# Patient Record
Sex: Female | Born: 1975 | Race: White | Hispanic: No | Smoking: Former smoker
Health system: Southern US, Community
[De-identification: ages and names within clinical notes are randomized; demographics above are authoritative.]

## PROBLEM LIST (undated history)

## (undated) DIAGNOSIS — I10 Essential (primary) hypertension: Secondary | ICD-10-CM

## (undated) DIAGNOSIS — M25532 Pain in left wrist: Secondary | ICD-10-CM

## (undated) DIAGNOSIS — F419 Anxiety disorder, unspecified: Secondary | ICD-10-CM

## (undated) DIAGNOSIS — J45909 Unspecified asthma, uncomplicated: Secondary | ICD-10-CM

## (undated) DIAGNOSIS — M797 Fibromyalgia: Secondary | ICD-10-CM

## (undated) DIAGNOSIS — F32A Depression, unspecified: Secondary | ICD-10-CM

## (undated) DIAGNOSIS — Z8619 Personal history of other infectious and parasitic diseases: Secondary | ICD-10-CM

## (undated) DIAGNOSIS — D509 Iron deficiency anemia, unspecified: Secondary | ICD-10-CM

## (undated) DIAGNOSIS — M92219 Osteochondrosis (juvenile) of carpal lunate [Kienbock], unspecified hand: Secondary | ICD-10-CM

## (undated) DIAGNOSIS — M1990 Unspecified osteoarthritis, unspecified site: Secondary | ICD-10-CM

## (undated) DIAGNOSIS — F1010 Alcohol abuse, uncomplicated: Secondary | ICD-10-CM

## (undated) DIAGNOSIS — F329 Major depressive disorder, single episode, unspecified: Secondary | ICD-10-CM

## (undated) DIAGNOSIS — Q682 Congenital deformity of knee: Secondary | ICD-10-CM

## (undated) HISTORY — DX: Congenital deformity of knee: Q68.2

## (undated) HISTORY — DX: Unspecified asthma, uncomplicated: J45.909

## (undated) HISTORY — DX: Major depressive disorder, single episode, unspecified: F32.9

## (undated) HISTORY — PX: APPENDECTOMY: SHX54

## (undated) HISTORY — DX: Alcohol abuse, uncomplicated: F10.10

## (undated) HISTORY — DX: Personal history of other infectious and parasitic diseases: Z86.19

## (undated) HISTORY — DX: Unspecified osteoarthritis, unspecified site: M19.90

## (undated) HISTORY — DX: Anxiety disorder, unspecified: F41.9

## (undated) HISTORY — DX: Essential (primary) hypertension: I10

## (undated) HISTORY — DX: Depression, unspecified: F32.A

---

## 2000-11-13 ENCOUNTER — Emergency Department (HOSPITAL_COMMUNITY): Admission: EM | Admit: 2000-11-13 | Discharge: 2000-11-13 | Payer: Self-pay | Admitting: Emergency Medicine

## 2000-11-13 ENCOUNTER — Encounter: Payer: Self-pay | Admitting: Emergency Medicine

## 2004-06-18 ENCOUNTER — Ambulatory Visit: Payer: Self-pay | Admitting: Internal Medicine

## 2006-12-17 ENCOUNTER — Ambulatory Visit (HOSPITAL_COMMUNITY): Admission: RE | Admit: 2006-12-17 | Discharge: 2006-12-17 | Payer: Self-pay | Admitting: Family Medicine

## 2008-02-08 ENCOUNTER — Inpatient Hospital Stay (HOSPITAL_COMMUNITY): Admission: AD | Admit: 2008-02-08 | Discharge: 2008-02-10 | Payer: Self-pay | Admitting: Obstetrics and Gynecology

## 2008-02-08 ENCOUNTER — Inpatient Hospital Stay (HOSPITAL_COMMUNITY): Admission: AD | Admit: 2008-02-08 | Discharge: 2008-02-08 | Payer: Self-pay | Admitting: Obstetrics & Gynecology

## 2010-07-22 ENCOUNTER — Other Ambulatory Visit: Payer: Self-pay | Admitting: Obstetrics and Gynecology

## 2011-01-06 LAB — RPR: RPR Ser Ql: NONREACTIVE

## 2011-01-06 LAB — CBC
MCHC: 32.2
RDW: 21.9 — ABNORMAL HIGH

## 2011-07-06 ENCOUNTER — Ambulatory Visit (INDEPENDENT_AMBULATORY_CARE_PROVIDER_SITE_OTHER): Payer: BC Managed Care – PPO | Admitting: Family Medicine

## 2011-07-06 VITALS — BP 114/74 | HR 81 | Temp 99.1°F | Resp 16 | Ht 62.75 in | Wt 205.0 lb

## 2011-07-06 DIAGNOSIS — J029 Acute pharyngitis, unspecified: Secondary | ICD-10-CM

## 2011-07-06 MED ORDER — HYDROCODONE-ACETAMINOPHEN 5-500 MG PO TABS: 1.0000 | ORAL_TABLET | Freq: Three times a day (TID) | ORAL | Status: AC | PRN

## 2011-07-06 NOTE — Patient Instructions (Signed)
L-lysine daily

## 2011-07-06 NOTE — Progress Notes (Signed)
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  Exposed to strep.  O:  Ulcer on left tonsillar pillar with corresponding left ant cervical node Neck otherwise supple Ears normal  A:  Adenovirus with ulceration and diarhea.  P:  MMW Vicodin for hs L-lysine

## 2011-09-11 ENCOUNTER — Ambulatory Visit (HOSPITAL_COMMUNITY)
Admission: RE | Admit: 2011-09-11 | Discharge: 2011-09-11 | Disposition: A | Discharge status: Home / Self Care | Payer: BC Managed Care – PPO | Source: Ambulatory Visit | Attending: Physician Assistant | Admitting: Physician Assistant

## 2011-09-11 ENCOUNTER — Ambulatory Visit (INDEPENDENT_AMBULATORY_CARE_PROVIDER_SITE_OTHER): Payer: BC Managed Care – PPO | Admitting: Family Medicine

## 2011-09-11 ENCOUNTER — Other Ambulatory Visit: Payer: Self-pay | Admitting: Family Medicine

## 2011-09-11 VITALS — BP 116/77 | HR 79 | Temp 99.0°F | Resp 18 | Ht 62.75 in | Wt 213.0 lb

## 2011-09-11 DIAGNOSIS — R609 Edema, unspecified: Secondary | ICD-10-CM

## 2011-09-11 DIAGNOSIS — M7989 Other specified soft tissue disorders: Secondary | ICD-10-CM

## 2011-09-11 LAB — POCT CBC
HCT, POC: 37.2 % — AB (ref 37.7–47.9)
Hemoglobin: 11.9 g/dL — AB (ref 12.2–16.2)
Lymph, poc: 2.4 (ref 0.6–3.4)
MCH, POC: 29.7 pg (ref 27–31.2)
MCHC: 32 g/dL (ref 31.8–35.4)
MCV: 92.8 fL (ref 80–97)
RBC: 4.01 M/uL — AB (ref 4.04–5.48)
WBC: 10.9 10*3/uL — AB (ref 4.6–10.2)

## 2011-09-11 LAB — POCT UA - MICROSCOPIC ONLY
Casts, Ur, LPF, POC: NEGATIVE
Crystals, Ur, HPF, POC: NEGATIVE
Mucus, UA: NEGATIVE

## 2011-09-11 LAB — POCT URINALYSIS DIPSTICK
Blood, UA: NEGATIVE
Glucose, UA: NEGATIVE
Spec Grav, UA: 1.01
Urobilinogen, UA: 0.2
pH, UA: 6

## 2011-09-11 NOTE — Progress Notes (Signed)
VASCULAR LAB PRELIMINARY  PRELIMINARY  PRELIMINARY  PRELIMINARY  Right lower extremity venous duplex completed.    Preliminary report: Right leg is negative for deep and superficial vein thrombosis.  Vanna Scotland,   RVT 09/11/2011, 5:37 PM

## 2011-09-11 NOTE — Progress Notes (Signed)
Subjective:    Patient ID: Tonya Perry, female    DOB: [birth year], 36 y.o.   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 "Charley horse" types pain in back of calf. No SOB. No CP.  No FH clotting disorders. Also, she has been trying to do Atkin's diet for the last 3 weeks, but she hasn't been very disciplined. She has had swelling in her R leg before and it always seem to happen on that side only.    Review of Systems  All other systems reviewed and are negative.       Objective:   Physical Exam  Nursing note and vitals reviewed. Constitutional: She is oriented to person, place, and time. She appears well-developed and well-nourished.  HENT:  Head: Normocephalic and atraumatic.  Neck: Normal range of motion. Neck supple. No thyromegaly present.  Cardiovascular: Normal rate, regular rhythm and normal heart sounds.   Pulmonary/Chest: Effort normal and breath sounds normal.  Musculoskeletal: Normal range of motion.       Lower extremities.  R calf 19", L calf, 18 3/4". Neg Homan's.  No erythema of either calf.  Edema is more around ankle.  DP pulses =B   Lymphadenopathy:    She has no cervical adenopathy.  Neurological: She is alert and oriented to person, place, and time.  Skin: Skin is warm.    Results for orders placed in visit on 09/11/11  POCT CBC      Component Value Range   WBC 10.9 (*) 4.6 - 10.2 (K/uL)   Lymph, poc 2.4  0.6 - 3.4    POC LYMPH PERCENT 21.8  10 - 50 (%L)   MID (cbc) 0.5  0 - 0.9    POC MID % 5.0  0 - 12 (%M)   POC Granulocyte 8.0 (*) 2 - 6.9    Granulocyte percent 73.2  37 - 80 (%G)   RBC 4.01 (*) 4.04 - 5.48 (M/uL)   Hemoglobin 11.9 (*) 12.2 - 16.2 (g/dL)   HCT, POC 52.8 (*) 41.3 - 47.9 (%)   MCV 92.8  80 - 97 (fL)   MCH, POC 29.7  27 - 31.2 (pg)   MCHC 32.0  31.8 - 35.4 (g/dL)   RDW, POC 24.4     Platelet Count, POC 376  142 - 424 (K/uL)   MPV 8.0  0 - 99.8 (fL)  POCT UA - MICROSCOPIC ONLY      Component Value  Range   WBC, Ur, HPF, POC 0-1     RBC, urine, microscopic 0-1     Bacteria, U Microscopic trace     Mucus, UA neg     Epithelial cells, urine per micros 0-1     Crystals, Ur, HPF, POC neg     Casts, Ur, LPF, POC neg     Yeast, UA neg    POCT URINALYSIS DIPSTICK      Component Value Range   Color, UA yellow     Clarity, UA clear     Glucose, UA neg     Bilirubin, UA neg     Ketones, UA neg     Spec Grav, UA 1.010     Blood, UA neg     pH, UA 6.0     Protein, UA neg     Urobilinogen, UA 0.2     Nitrite, UA neg     Leukocytes, UA Negative  Assessment & Plan:  Leg swelling-discussed with Dr.  Milus Glazier.  Sending for venous U/S. Increase water intake and elevate if U/S is negative.  CP/SOB warnings given

## 2011-09-12 LAB — COMPREHENSIVE METABOLIC PANEL
Albumin: 4.2 g/dL (ref 3.5–5.2)
Alkaline Phosphatase: 56 U/L (ref 39–117)
BUN: 14 mg/dL (ref 6–23)
Calcium: 9.2 mg/dL (ref 8.4–10.5)
Creat: 0.86 mg/dL (ref 0.50–1.10)
Glucose, Bld: 90 mg/dL (ref 70–99)
Potassium: 3.7 mEq/L (ref 3.5–5.3)

## 2012-09-14 DIAGNOSIS — F419 Anxiety disorder, unspecified: Secondary | ICD-10-CM | POA: Insufficient documentation

## 2012-09-14 DIAGNOSIS — E669 Obesity, unspecified: Secondary | ICD-10-CM | POA: Insufficient documentation

## 2012-09-14 DIAGNOSIS — F32A Depression, unspecified: Secondary | ICD-10-CM | POA: Insufficient documentation

## 2012-11-16 ENCOUNTER — Ambulatory Visit (INDEPENDENT_AMBULATORY_CARE_PROVIDER_SITE_OTHER): Payer: BC Managed Care – PPO | Admitting: Family Medicine

## 2012-11-16 VITALS — BP 128/76 | HR 70 | Temp 98.3°F | Resp 16 | Ht 63.0 in | Wt 215.0 lb

## 2012-11-16 DIAGNOSIS — J309 Allergic rhinitis, unspecified: Secondary | ICD-10-CM

## 2012-11-16 MED ORDER — FLUTICASONE PROPIONATE 50 MCG/ACT NA SUSP: 2.0000 | Freq: Every day | NASAL | Status: DC

## 2012-11-16 NOTE — Patient Instructions (Addendum)
Try the nasal spray for allergies.  Continue to use zyrtec as directed.  If you are not better in the next week or two please let me know

## 2012-11-16 NOTE — Progress Notes (Signed)
[facility] [address], [place] [address] [phone]  Date:  11/16/2012   Name:  Tonya Perry   DOB:  [birth year]   MRN:  [id number]  PCP:  No primary provider on file.    Chief Complaint: Otalgia   History of Present Illness:  Tonya Perry is a 37 y.o. very pleasant female patient who presents with the following:  She has noted a feeling of something "crawling" in her ear since February. Only the right ear is effected.  She sometimes feels some pain and pressure in the ear.   Her hearing seems to be ok.   She has noted some ringing off and on.   No drainage from the ear.  She does note a mild right sided ST over the last few days.   She does have allergies- some nasal itching and discharge.  Nasal congestoin She is taking zyrtec right now for her allergies She is otherwise generally healthy.   There are no active problems to display for this patient.   Past Medical History  Diagnosis Date  . Allergy   . Depression   . Anxiety   . Asthma     Past Surgical History  Procedure Laterality Date  . Eye surgery      History  Substance Use Topics  . Smoking status: Former Smoker    Quit date: 04/06/1996  . Smokeless tobacco: Not on file  . Alcohol Use: Yes    Family History  Problem Relation Age of Onset  . Depression Mother   . COPD Father   . Depression Maternal Grandmother   . Diabetes Maternal Grandfather   . Cancer Paternal Grandfather     Allergies  Allergen Reactions  . Penicillins Rash    Medication list has been reviewed and updated.  Current Outpatient Prescriptions on File Prior to Visit  Medication Sig Dispense Refill  . fish oil-omega-3 fatty acids 1000 MG capsule Take 1 g by mouth daily.      . Multiple Vitamins-Minerals (MULTIVITAMIN WITH MINERALS) tablet Take 1 tablet by mouth daily.      . ARIPiprazole (ABILIFY) 2 MG tablet Take 2 mg by mouth daily.      Marland Kitchen escitalopram (LEXAPRO) 10 MG tablet Take 10  mg by mouth daily.       No current facility-administered medications on file prior to visit.    Review of Systems:  As per HPI- otherwise negative.   Physical Examination: Filed Vitals:   11/16/12 1650  BP: 128/76  Pulse: 103  Temp: 98.3 F (36.8 C)  Resp: 16   Filed Vitals:   11/16/12 1650  Height: 5\' 3"  (1.6 m)  Weight: 215 lb (97.523 kg)   Body mass index is 38.09 kg/(m^2). Ideal Body Weight: Weight in (lb) to have BMI = 25: 140.8  GEN: WDWN, NAD, Non-toxic, A & O x 3, overweight, looks well HEENT: Atraumatic, Normocephalic. Neck supple. No masses, No LAD.  Bilateral TM wnl.  She does have clear fluid behind the TM's bilaterally, and there is a hair in the right ear canal.  Oropharynx normal.  PEERL,EOMI.  Nasal cavity congested and appears consistent with allergies Ears and Nose: No external deformity. CV: RRR, No M/G/R. No JVD. No thrill. No extra heart sounds. PULM: CTA B, no wheezes, crackles, rhonchi. No retractions. No resp. distress. No accessory muscle use. EXTR: No c/c/e NEURO Normal gait.  PSYCH: Normally interactive. Conversant. Not depressed or anxious appearing.  Calm  demeanor.   There is a hair in the right ear canal.  Irrigated with warm water and removed.   Assessment and Plan: Allergic rhinitis - Plan: fluticasone (FLONASE) 50 MCG/ACT nasal spray  Suspect ETD is contributing to her symptoms.  Will have her continue zyrtec and add flonase.  Also removed hair from ear canal.  Asked her to let me know if not better in the next week or two, Sooner if worse.     Signed Abbe Amsterdam, MD

## 2013-05-03 ENCOUNTER — Other Ambulatory Visit: Payer: Self-pay | Admitting: Obstetrics and Gynecology

## 2013-06-23 ENCOUNTER — Ambulatory Visit (INDEPENDENT_AMBULATORY_CARE_PROVIDER_SITE_OTHER): Payer: BC Managed Care – PPO | Admitting: Family Medicine

## 2013-06-23 VITALS — BP 118/78 | HR 117 | Temp 98.3°F | Resp 18 | Ht 63.0 in | Wt 199.0 lb

## 2013-06-23 DIAGNOSIS — N643 Galactorrhea not associated with childbirth: Secondary | ICD-10-CM

## 2013-06-23 DIAGNOSIS — N61 Mastitis without abscess: Secondary | ICD-10-CM

## 2013-06-23 DIAGNOSIS — J029 Acute pharyngitis, unspecified: Secondary | ICD-10-CM

## 2013-06-23 DIAGNOSIS — J020 Streptococcal pharyngitis: Secondary | ICD-10-CM

## 2013-06-23 LAB — POCT RAPID STREP A (OFFICE): RAPID STREP A SCREEN: POSITIVE — AB

## 2013-06-23 MED ORDER — CEPHALEXIN 500 MG PO CAPS: 500.0000 mg | ORAL_CAPSULE | Freq: Three times a day (TID) | ORAL | Status: DC

## 2013-06-23 NOTE — Progress Notes (Signed)
Subjective:    Results for orders placed in visit on 06/23/13  POCT RAPID STREP A (OFFICE)      Result Value Ref Range   Rapid Strep A Screen Positive (*) Negative

## 2013-06-23 NOTE — Patient Instructions (Signed)
Take the cephalexin 500 mg one pill 3 times daily for breast and strep  Drink plenty of fluids. However the throat we'll probably continue to hurt until the infection is doing better over the next couple of days. Take Tylenol or ibuprofen for pain  Minimize suckling on the breast. Consider working toward weaning your daughter, because the milk production we'll probably continue as long as your breast get stimulated.  Strep Throat Strep throat is an infection of the throat caused by a bacteria named Streptococcus pyogenes. Your caregiver may call the infection streptococcal "tonsillitis" or "pharyngitis" depending on whether there are signs of inflammation in the tonsils or back of the throat. Strep throat is most common in children aged 5 15 years during the cold months of the year, but it can occur in people of any age during any season. This infection is spread from person to person (contagious) through coughing, sneezing, or other close contact. SYMPTOMS   Fever or chills.  Painful, swollen, red tonsils or throat.  Pain or difficulty when swallowing.  White or yellow spots on the tonsils or throat.  Swollen, tender lymph nodes or "glands" of the neck or under the jaw.  Red rash all over the body (rare). DIAGNOSIS  Many different infections can cause the same symptoms. A test must be done to confirm the diagnosis so the right treatment can be given. A "rapid strep test" can help your caregiver make the diagnosis in a few minutes. If this test is not available, a light swab of the infected area can be used for a throat culture test. If a throat culture test is done, results are usually available in a day or two. TREATMENT  Strep throat is treated with antibiotic medicine. HOME CARE INSTRUCTIONS   Gargle with 1 tsp of salt in 1 cup of warm water, 3 4 times per day or as needed for comfort.  Family members who also have a sore throat or fever should be tested for strep throat and treated  with antibiotics if they have the strep infection.  Make sure everyone in your household washes their hands well.  Do not share food, drinking cups, or personal items that could cause the infection to spread to others.  You may need to eat a soft food diet until your sore throat gets better.  Drink enough water and fluids to keep your urine clear or pale yellow. This will help prevent dehydration.  Get plenty of rest.  Stay home from school, daycare, or work until you have been on antibiotics for 24 hours.  Only take over-the-counter or prescription medicines for pain, discomfort, or fever as directed by your caregiver.  If antibiotics are prescribed, take them as directed. Finish them even if you start to feel better. SEEK MEDICAL CARE IF:   The glands in your neck continue to enlarge.  You develop a rash, cough, or earache.  You cough up green, yellow-brown, or bloody sputum.  You have pain or discomfort not controlled by medicines.  Your problems seem to be getting worse rather than better. SEEK IMMEDIATE MEDICAL CARE IF:   You develop any new symptoms such as vomiting, severe headache, stiff or painful neck, chest pain, shortness of breath, or trouble swallowing.  You develop severe throat pain, drooling, or changes in your voice.  You develop swelling of the neck, or the skin on the neck becomes red and tender.  You have a fever.  You develop signs of dehydration, such as fatigue,  dry mouth, and decreased urination.  You become increasingly sleepy, or you cannot wake up completely. Document Released: 03/20/2000 Document Revised: 03/09/2012 Document Reviewed: 05/22/2010 [employer] Patient Information 2014 [employer].  

## 2013-11-04 ENCOUNTER — Emergency Department (HOSPITAL_COMMUNITY): Payer: BC Managed Care – PPO | Admitting: Certified Registered Nurse Anesthetist

## 2013-11-04 ENCOUNTER — Observation Stay (HOSPITAL_COMMUNITY)
Admission: EM | Admit: 2013-11-04 | Discharge: 2013-11-05 | Disposition: A | Discharge status: Home / Self Care | Payer: BC Managed Care – PPO | Attending: Surgery | Admitting: Surgery

## 2013-11-04 ENCOUNTER — Encounter (HOSPITAL_COMMUNITY): Payer: Self-pay | Admitting: Emergency Medicine

## 2013-11-04 ENCOUNTER — Emergency Department (HOSPITAL_COMMUNITY): Payer: BC Managed Care – PPO

## 2013-11-04 ENCOUNTER — Ambulatory Visit (INDEPENDENT_AMBULATORY_CARE_PROVIDER_SITE_OTHER): Payer: BC Managed Care – PPO | Admitting: Family Medicine

## 2013-11-04 ENCOUNTER — Encounter (HOSPITAL_COMMUNITY)
Admission: EM | Disposition: A | Discharge status: Home / Self Care | Payer: Self-pay | Source: Home / Self Care | Attending: Emergency Medicine

## 2013-11-04 ENCOUNTER — Encounter (HOSPITAL_COMMUNITY): Payer: BC Managed Care – PPO | Admitting: Certified Registered Nurse Anesthetist

## 2013-11-04 VITALS — BP 100/60 | HR 90 | Temp 97.9°F | Resp 16 | Ht 63.0 in | Wt 194.0 lb

## 2013-11-04 DIAGNOSIS — K3580 Unspecified acute appendicitis: Principal | ICD-10-CM | POA: Insufficient documentation

## 2013-11-04 DIAGNOSIS — Z87891 Personal history of nicotine dependence: Secondary | ICD-10-CM | POA: Insufficient documentation

## 2013-11-04 DIAGNOSIS — R1031 Right lower quadrant pain: Secondary | ICD-10-CM

## 2013-11-04 DIAGNOSIS — E669 Obesity, unspecified: Secondary | ICD-10-CM | POA: Insufficient documentation

## 2013-11-04 DIAGNOSIS — F3289 Other specified depressive episodes: Secondary | ICD-10-CM | POA: Insufficient documentation

## 2013-11-04 DIAGNOSIS — F411 Generalized anxiety disorder: Secondary | ICD-10-CM | POA: Insufficient documentation

## 2013-11-04 DIAGNOSIS — G8929 Other chronic pain: Secondary | ICD-10-CM

## 2013-11-04 DIAGNOSIS — F329 Major depressive disorder, single episode, unspecified: Secondary | ICD-10-CM | POA: Insufficient documentation

## 2013-11-04 DIAGNOSIS — Z9049 Acquired absence of other specified parts of digestive tract: Secondary | ICD-10-CM

## 2013-11-04 DIAGNOSIS — J45909 Unspecified asthma, uncomplicated: Secondary | ICD-10-CM | POA: Insufficient documentation

## 2013-11-04 DIAGNOSIS — Z79899 Other long term (current) drug therapy: Secondary | ICD-10-CM | POA: Insufficient documentation

## 2013-11-04 DIAGNOSIS — Z6833 Body mass index (BMI) 33.0-33.9, adult: Secondary | ICD-10-CM | POA: Insufficient documentation

## 2013-11-04 HISTORY — PX: LAPAROSCOPIC APPENDECTOMY: SHX408

## 2013-11-04 LAB — CBC WITH DIFFERENTIAL/PLATELET
BASOS ABS: 0 10*3/uL (ref 0.0–0.1)
BASOS PCT: 0 % (ref 0–1)
EOS ABS: 0.1 10*3/uL (ref 0.0–0.7)
EOS PCT: 1 % (ref 0–5)
HEMATOCRIT: 37.5 % (ref 36.0–46.0)
Hemoglobin: 12 g/dL (ref 12.0–15.0)
Lymphocytes Relative: 21 % (ref 12–46)
Lymphs Abs: 2.5 10*3/uL (ref 0.7–4.0)
MCH: 26.5 pg (ref 26.0–34.0)
MCHC: 32 g/dL (ref 30.0–36.0)
MCV: 83 fL (ref 78.0–100.0)
MONO ABS: 0.8 10*3/uL (ref 0.1–1.0)
MONOS PCT: 6 % (ref 3–12)
NEUTROS ABS: 8.6 10*3/uL — AB (ref 1.7–7.7)
Neutrophils Relative %: 72 % (ref 43–77)
Platelets: 362 10*3/uL (ref 150–400)
RBC: 4.52 MIL/uL (ref 3.87–5.11)
RDW: 15.8 % — AB (ref 11.5–15.5)
WBC: 12 10*3/uL — ABNORMAL HIGH (ref 4.0–10.5)

## 2013-11-04 LAB — COMPREHENSIVE METABOLIC PANEL
ALBUMIN: 4 g/dL (ref 3.5–5.2)
ALT: 12 U/L (ref 0–35)
ANION GAP: 13 (ref 5–15)
AST: 22 U/L (ref 0–37)
Alkaline Phosphatase: 91 U/L (ref 39–117)
BUN: 10 mg/dL (ref 6–23)
CALCIUM: 9.6 mg/dL (ref 8.4–10.5)
CHLORIDE: 102 meq/L (ref 96–112)
CO2: 25 mEq/L (ref 19–32)
CREATININE: 0.69 mg/dL (ref 0.50–1.10)
GFR calc Af Amer: >90 mL/min (ref >90)
GFR calc non Af Amer: >90 mL/min (ref >90)
Glucose, Bld: 94 mg/dL (ref 70–99)
Potassium: 3.5 mEq/L — ABNORMAL LOW (ref 3.7–5.3)
Sodium: 140 mEq/L (ref 137–147)
TOTAL PROTEIN: 7.9 g/dL (ref 6.0–8.3)
Total Bilirubin: 0.3 mg/dL (ref 0.3–1.2)

## 2013-11-04 LAB — URINALYSIS, ROUTINE W REFLEX MICROSCOPIC
Bilirubin Urine: NEGATIVE
GLUCOSE, UA: NEGATIVE mg/dL
Hgb urine dipstick: NEGATIVE
KETONES UR: NEGATIVE mg/dL
NITRITE: NEGATIVE
PH: 5.5 (ref 5.0–8.0)
Protein, ur: NEGATIVE mg/dL
SPECIFIC GRAVITY, URINE: 1.007 (ref 1.005–1.030)
Urobilinogen, UA: 0.2 mg/dL (ref 0.0–1.0)

## 2013-11-04 LAB — PREGNANCY, URINE: PREG TEST UR: NEGATIVE

## 2013-11-04 LAB — URINE MICROSCOPIC-ADD ON

## 2013-11-04 SURGERY — APPENDECTOMY, LAPAROSCOPIC
Anesthesia: General

## 2013-11-04 MED ORDER — 0.9 % SODIUM CHLORIDE (POUR BTL) OPTIME
TOPICAL | Status: DC | PRN
  Administered 2013-11-04: 1000 mL

## 2013-11-04 MED ORDER — METOCLOPRAMIDE HCL 5 MG/ML IJ SOLN
INTRAMUSCULAR | Status: DC | PRN
  Administered 2013-11-04: 10 mg via INTRAVENOUS

## 2013-11-04 MED ORDER — BUPIVACAINE LIPOSOME 1.3 % IJ SUSP
20.0000 mL | Freq: Once | INTRAMUSCULAR | Status: AC
  Administered 2013-11-04: 20 mL
  Filled 2013-11-04: qty 20

## 2013-11-04 MED ORDER — LACTATED RINGERS IR SOLN
Status: DC | PRN
  Administered 2013-11-04: 3000 mL

## 2013-11-04 MED ORDER — IOHEXOL 300 MG/ML  SOLN
100.0000 mL | Freq: Once | INTRAMUSCULAR | Status: AC | PRN
  Administered 2013-11-04: 100 mL via INTRAVENOUS

## 2013-11-04 MED ORDER — PHENYLEPHRINE 40 MCG/ML (10ML) SYRINGE FOR IV PUSH (FOR BLOOD PRESSURE SUPPORT)
PREFILLED_SYRINGE | INTRAVENOUS | Status: AC
  Filled 2013-11-04: qty 10

## 2013-11-04 MED ORDER — PHENYLEPHRINE HCL 10 MG/ML IJ SOLN
INTRAMUSCULAR | Status: DC | PRN
  Administered 2013-11-04 (×2): 40 ug via INTRAVENOUS

## 2013-11-04 MED ORDER — ONDANSETRON HCL 4 MG/2ML IJ SOLN
INTRAMUSCULAR | Status: AC
  Filled 2013-11-04: qty 2

## 2013-11-04 MED ORDER — PROPOFOL 10 MG/ML IV BOLUS
INTRAVENOUS | Status: AC
  Filled 2013-11-04: qty 20

## 2013-11-04 MED ORDER — DIPHENHYDRAMINE HCL 50 MG/ML IJ SOLN
INTRAMUSCULAR | Status: AC
  Filled 2013-11-04: qty 1

## 2013-11-04 MED ORDER — DIPHENHYDRAMINE HCL 50 MG/ML IJ SOLN
INTRAMUSCULAR | Status: DC | PRN
  Administered 2013-11-04: 12.5 mg via INTRAVENOUS

## 2013-11-04 MED ORDER — ERTAPENEM SODIUM 1 G IJ SOLR
1.0000 g | INTRAMUSCULAR | Status: DC
  Administered 2013-11-04: 1 g via INTRAVENOUS
  Filled 2013-11-04: qty 1

## 2013-11-04 MED ORDER — HYDROMORPHONE HCL PF 1 MG/ML IJ SOLN
0.2500 mg | INTRAMUSCULAR | Status: DC | PRN
  Administered 2013-11-04 – 2013-11-05 (×2): 0.5 mg via INTRAVENOUS

## 2013-11-04 MED ORDER — DEXAMETHASONE SODIUM PHOSPHATE 10 MG/ML IJ SOLN
INTRAMUSCULAR | Status: AC
  Filled 2013-11-04: qty 1

## 2013-11-04 MED ORDER — KCL IN DEXTROSE-NACL 20-5-0.45 MEQ/L-%-% IV SOLN
INTRAVENOUS | Status: DC
  Administered 2013-11-05: 01:00:00 via INTRAVENOUS
  Filled 2013-11-04 (×3): qty 1000

## 2013-11-04 MED ORDER — HYDROMORPHONE HCL PF 1 MG/ML IJ SOLN
INTRAMUSCULAR | Status: AC
  Filled 2013-11-04: qty 1

## 2013-11-04 MED ORDER — EPHEDRINE SULFATE 50 MG/ML IJ SOLN
INTRAMUSCULAR | Status: AC
Start: 2013-11-04 — End: 2013-11-04
  Filled 2013-11-04: qty 1

## 2013-11-04 MED ORDER — HYDROMORPHONE HCL PF 1 MG/ML IJ SOLN
0.5000 mg | Freq: Once | INTRAMUSCULAR | Status: AC
  Administered 2013-11-04: 0.5 mg via INTRAVENOUS
  Filled 2013-11-04: qty 1

## 2013-11-04 MED ORDER — METOCLOPRAMIDE HCL 5 MG/ML IJ SOLN
INTRAMUSCULAR | Status: AC
  Filled 2013-11-04: qty 2

## 2013-11-04 MED ORDER — SUCCINYLCHOLINE CHLORIDE 20 MG/ML IJ SOLN
INTRAMUSCULAR | Status: DC | PRN
  Administered 2013-11-04: 100 mg via INTRAVENOUS

## 2013-11-04 MED ORDER — ERTAPENEM SODIUM 1 G IJ SOLR: 1.0000 g | INTRAMUSCULAR | Status: DC

## 2013-11-04 MED ORDER — SODIUM CHLORIDE 0.9 % IJ SOLN
INTRAMUSCULAR | Status: AC
  Filled 2013-11-04: qty 10

## 2013-11-04 MED ORDER — MIDAZOLAM HCL 2 MG/2ML IJ SOLN
INTRAMUSCULAR | Status: AC
  Filled 2013-11-04: qty 2

## 2013-11-04 MED ORDER — PROPOFOL 10 MG/ML IV BOLUS
INTRAVENOUS | Status: DC | PRN
  Administered 2013-11-04: 180 mg via INTRAVENOUS

## 2013-11-04 MED ORDER — LIDOCAINE HCL (CARDIAC) 20 MG/ML IV SOLN
INTRAVENOUS | Status: DC | PRN
  Administered 2013-11-04: 100 mg via INTRAVENOUS

## 2013-11-04 MED ORDER — ATROPINE SULFATE 0.4 MG/ML IJ SOLN
INTRAMUSCULAR | Status: AC
  Filled 2013-11-04: qty 1

## 2013-11-04 MED ORDER — ERTAPENEM SODIUM 1 G IJ SOLR
INTRAMUSCULAR | Status: AC
  Filled 2013-11-04: qty 1

## 2013-11-04 MED ORDER — FENTANYL CITRATE 0.05 MG/ML IJ SOLN
INTRAMUSCULAR | Status: DC | PRN
  Administered 2013-11-04 (×5): 50 ug via INTRAVENOUS

## 2013-11-04 MED ORDER — MIDAZOLAM HCL 5 MG/5ML IJ SOLN
INTRAMUSCULAR | Status: DC | PRN
  Administered 2013-11-04: 2 mg via INTRAVENOUS

## 2013-11-04 MED ORDER — PROMETHAZINE HCL 25 MG/ML IJ SOLN: 6.2500 mg | INTRAMUSCULAR | Status: DC | PRN

## 2013-11-04 MED ORDER — LIDOCAINE HCL (CARDIAC) 20 MG/ML IV SOLN
INTRAVENOUS | Status: AC
  Filled 2013-11-04: qty 5

## 2013-11-04 MED ORDER — FENTANYL CITRATE 0.05 MG/ML IJ SOLN
INTRAMUSCULAR | Status: AC
  Filled 2013-11-04: qty 5

## 2013-11-04 MED ORDER — LACTATED RINGERS IV SOLN
INTRAVENOUS | Status: DC | PRN
  Administered 2013-11-04 (×2): via INTRAVENOUS
  Administered 2013-11-05

## 2013-11-04 MED ORDER — ROCURONIUM BROMIDE 100 MG/10ML IV SOLN
INTRAVENOUS | Status: DC | PRN
  Administered 2013-11-04: 35 mg via INTRAVENOUS

## 2013-11-04 MED ORDER — NEOSTIGMINE METHYLSULFATE 10 MG/10ML IV SOLN
INTRAVENOUS | Status: DC | PRN
  Administered 2013-11-04: 4 mg via INTRAVENOUS

## 2013-11-04 MED ORDER — IOHEXOL 300 MG/ML  SOLN
50.0000 mL | Freq: Once | INTRAMUSCULAR | Status: AC | PRN
  Administered 2013-11-04: 50 mL via ORAL

## 2013-11-04 MED ORDER — GLYCOPYRROLATE 0.2 MG/ML IJ SOLN
INTRAMUSCULAR | Status: DC | PRN
  Administered 2013-11-04: 0.6 mg via INTRAVENOUS

## 2013-11-04 MED ORDER — ONDANSETRON HCL 4 MG/2ML IJ SOLN
INTRAMUSCULAR | Status: DC | PRN
  Administered 2013-11-04: 4 mg via INTRAVENOUS

## 2013-11-04 MED ORDER — SODIUM CHLORIDE 0.9 % IV BOLUS (SEPSIS)
1000.0000 mL | Freq: Once | INTRAVENOUS | Status: AC
  Administered 2013-11-04: 1000 mL via INTRAVENOUS

## 2013-11-04 MED ORDER — DEXAMETHASONE SODIUM PHOSPHATE 10 MG/ML IJ SOLN
INTRAMUSCULAR | Status: DC | PRN
  Administered 2013-11-04: 10 mg via INTRAVENOUS

## 2013-11-04 SURGICAL SUPPLY — 37 items
APPLIER CLIP ROT 10 11.4 M/L (STAPLE)
CABLE HIGH FREQUENCY MONO STRZ (ELECTRODE) ×3 IMPLANT
CLIP APPLIE ROT 10 11.4 M/L (STAPLE) IMPLANT
CLOSURE WOUND 1/2 X4 (GAUZE/BANDAGES/DRESSINGS)
CUTTER FLEX LINEAR 45M (STAPLE) ×3 IMPLANT
DECANTER SPIKE VIAL GLASS SM (MISCELLANEOUS) IMPLANT
DERMABOND ADVANCED (GAUZE/BANDAGES/DRESSINGS) ×2
DERMABOND ADVANCED .7 DNX12 (GAUZE/BANDAGES/DRESSINGS) ×1 IMPLANT
DRAPE LAPAROSCOPIC ABDOMINAL (DRAPES) ×3 IMPLANT
ELECT REM PT RETURN 9FT ADLT (ELECTROSURGICAL) ×3
ELECTRODE REM PT RTRN 9FT ADLT (ELECTROSURGICAL) ×1 IMPLANT
ENDOLOOP SUT PDS II  0 18 (SUTURE)
ENDOLOOP SUT PDS II 0 18 (SUTURE) IMPLANT
GLOVE BIOGEL M 8.0 STRL (GLOVE) ×3 IMPLANT
GOWN SPEC L4 XLG W/TWL (GOWN DISPOSABLE) ×3 IMPLANT
GOWN STRL REUS W/TWL XL LVL3 (GOWN DISPOSABLE) ×9 IMPLANT
KIT BASIN OR (CUSTOM PROCEDURE TRAY) ×3 IMPLANT
POUCH RETRIEVAL ECOSAC 10 (ENDOMECHANICALS) IMPLANT
POUCH RETRIEVAL ECOSAC 10MM (ENDOMECHANICALS)
POUCH SPECIMEN RETRIEVAL 10MM (ENDOMECHANICALS) ×3 IMPLANT
RELOAD 45 VASCULAR/THIN (ENDOMECHANICALS) ×3 IMPLANT
RELOAD STAPLE TA45 3.5 REG BLU (ENDOMECHANICALS) IMPLANT
SCISSORS LAP 5X45 EPIX DISP (ENDOMECHANICALS) IMPLANT
SET IRRIG TUBING LAPAROSCOPIC (IRRIGATION / IRRIGATOR) ×3 IMPLANT
SHEARS CURVED HARMONIC AC 45CM (MISCELLANEOUS) ×3 IMPLANT
SLEEVE XCEL OPT CAN 5 100 (ENDOMECHANICALS) IMPLANT
SOLUTION ANTI FOG 6CC (MISCELLANEOUS) ×3 IMPLANT
STRIP CLOSURE SKIN 1/2X4 (GAUZE/BANDAGES/DRESSINGS) IMPLANT
SUT VIC AB 4-0 SH 18 (SUTURE) ×3 IMPLANT
TOWEL OR 17X26 10 PK STRL BLUE (TOWEL DISPOSABLE) ×3 IMPLANT
TOWEL OR NON WOVEN STRL DISP B (DISPOSABLE) ×3 IMPLANT
TRAY FOLEY CATH 14FRSI W/METER (CATHETERS) ×3 IMPLANT
TRAY LAP CHOLE (CUSTOM PROCEDURE TRAY) ×3 IMPLANT
TROCAR BLADELESS OPT 5 100 (ENDOMECHANICALS) ×3 IMPLANT
TROCAR XCEL BLUNT TIP 100MML (ENDOMECHANICALS) ×3 IMPLANT
TROCAR XCEL NON-BLD 11X100MML (ENDOMECHANICALS) IMPLANT
TUBING INSUFFLATION 10FT LAP (TUBING) ×3 IMPLANT

## 2013-11-04 NOTE — Anesthesia Preprocedure Evaluation (Signed)
Anesthesia Evaluation  Patient identified by MRN, date of birth, ID band Patient awake    Reviewed: Allergy & Precautions, H&P , NPO status , Patient's Chart, lab work & pertinent test results  Airway Mallampati: II TM Distance: >3 FB Neck ROM: Full    Dental no notable dental hx.    Pulmonary asthma , former smoker,  breath sounds clear to auscultation  Pulmonary exam normal       Cardiovascular negative cardio ROS  Rhythm:Regular Rate:Normal     Neuro/Psych PSYCHIATRIC DISORDERS Anxiety Depression negative neurological ROS     GI/Hepatic negative GI ROS, Neg liver ROS,   Endo/Other  negative endocrine ROS  Renal/GU negative Renal ROS  negative genitourinary   Musculoskeletal negative musculoskeletal ROS (+)   Abdominal (+) + obese,   Peds negative pediatric ROS (+)  Hematology negative hematology ROS (+)   Anesthesia Other Findings   Reproductive/Obstetrics negative OB ROS                           Anesthesia Physical Anesthesia Plan  ASA: II  Anesthesia Plan: General   Post-op Pain Management:    Induction: Intravenous  Airway Management Planned: Oral ETT  Additional Equipment:   Intra-op Plan:   Post-operative Plan: Extubation in OR  Informed Consent: I have reviewed the patients History and Physical, chart, labs and discussed the procedure including the risks, benefits and alternatives for the proposed anesthesia with the patient or authorized representative who has indicated his/her understanding and acceptance.   Dental advisory given  Plan Discussed with: CRNA  Anesthesia Plan Comments:         Anesthesia Quick Evaluation

## 2013-11-04 NOTE — Progress Notes (Signed)
Subjective:    Patient ID: Tonya Perry, female    DOB: 1975-12-05, 38 y.o.   MRN: [id number] Chief Complaint  Patient presents with  . Allergies    allergy testing monday had to stop meds  . Abdominal Pain    LRQ x 3 days   HPI Went off allergy medicine Thursday night - was on zyrtec and benadryl.  Itching  Started having RLQ abd pain Wed night.  Was intermitent then last night pain was very severe.  Decreased appetite, decreased stools with constipation.  Very thirsty last night and did have some EtOH last night but voiding with increased frequency.  Did have chills last night but no fevers. Felt really poorly last night - thought about going to ER.  Was nauseated but no vomiting. No unusual vaginal d/c.  Not having any menstrual bleeding due to IUD.  Past Medical History  Diagnosis Date  . Allergy   . Depression   . Anxiety   . Asthma    Current Outpatient Prescriptions on File Prior to Visit  Medication Sig Dispense Refill  . fish oil-omega-3 fatty acids 1000 MG capsule Take 1 g by mouth daily.      Marland Kitchen gabapentin (NEURONTIN) 100 MG capsule Take 300 mg by mouth 2 (two) times daily.       . Multiple Vitamins-Minerals (MULTIVITAMIN WITH MINERALS) tablet Take 1 tablet by mouth daily.      . sertraline (ZOLOFT) 50 MG tablet Take 100 mg by mouth daily.       Marland Kitchen Dextroamphetamine Sulfate 20 MG TABS Take 10 mg by mouth.       . fluticasone (FLONASE) 50 MCG/ACT nasal spray Place 2 sprays into the nose daily.  16 g  6   No current facility-administered medications on file prior to visit.   Allergies  Allergen Reactions  . Azithromycin     diarrhea  . Penicillins Rash   Past Surgical History  Procedure Laterality Date  . Eye surgery        Review of Systems  Constitutional: Positive for chills, diaphoresis, activity change and appetite change. Negative for fever and unexpected weight change.  Gastrointestinal: Positive for nausea, abdominal pain and constipation.  Negative for vomiting and diarrhea.  Endocrine: Positive for polydipsia and polyuria. Negative for polyphagia.  Genitourinary: Positive for frequency and flank pain. Negative for vaginal discharge and menstrual problem.  Neurological: Positive for weakness. Negative for tremors.  Psychiatric/Behavioral: Positive for sleep disturbance.      BP 100/60  Pulse 90  Temp(Src) 97.9 F (36.6 C) (Oral)  Resp 16  Ht 5\' 3"  (1.6 m)  Wt 194 lb (87.998 kg)  BMI 34.37 kg/m2  SpO2 99%  Objective:   Physical Exam  Constitutional: She is oriented to person, place, and time. She appears well-developed and well-nourished. She appears distressed.  Constantly itching various areas  HENT:  Head: Normocephalic and atraumatic.  Right Ear: External ear normal.  Eyes: Conjunctivae are normal. No scleral icterus.  Cardiovascular: Normal rate, regular rhythm and normal heart sounds.   Pulmonary/Chest: Effort normal and breath sounds normal. No respiratory distress.  Abdominal: Normal appearance. Bowel sounds are increased. There is tenderness in the right lower quadrant. There is CVA tenderness and tenderness at McBurney's point. There is no rigidity, no rebound, no guarding and negative Murphy's sign.  + referred pain to RLQ when palpating other areas of abd +RLQ pain with stressing of hip flexors and when tapping bottom of Rt foot  Neurological: She is alert and oriented to person, place, and time.  Skin: Skin is warm and dry. She is not diaphoretic. No erythema.  Psychiatric: She has a normal mood and affect. Her behavior is normal.      Assessment & Plan:  Suspect acute appendicitis - discussed options inc w/u here w/ o/p CT scan but as our office is closing in <1 hr and pt would greatly benefit from IVF and pain medications will send [facility] straight [facility] eval - needs ua, cbc, cmp, abd/pelvic CT, etc.  [facility] ER charge nurse notified and pt sent over by private vehicle  Norberto SorensonEva Jensyn Cambria, MD MPH

## 2013-11-04 NOTE — ED Provider Notes (Signed)
Medical screening examination/treatment/procedure(s) were performed by non-physician practitioner and as supervising physician I was immediately available for consultation/collaboration.   EKG Interpretation None       Martha K Linker, MD 11/04/13 2121 

## 2013-11-04 NOTE — ED Notes (Signed)
Pt here from Urgent Care with c/o of right side abd pain radiating around towards flank. Nausea. Denies vomiting.

## 2013-11-04 NOTE — ED Provider Notes (Signed)
CSN: [id number]     Arrival date & time 11/04/13  1528 History   First MD Initiated Contact with Patient 11/04/13 1642     Chief Complaint  Patient presents with  . Abdominal Pain   Patient is a 38 y.o. female presenting with abdominal pain. The history is provided by the patient. No language interpreter was used.  Abdominal Pain Pain location:  RLQ Pain quality: aching and sharp   Pain radiates to:  R flank Pain severity:  Moderate Onset quality:  Gradual Duration:  3 days Timing:  Intermittent Progression:  Worsening Chronicity:  New Context: awakening from sleep   Context: not alcohol use, not diet changes, not eating, not laxative use, not medication withdrawal, not previous surgeries, not recent illness, not recent sexual activity, not recent travel, not retching, not sick contacts, not suspicious food intake and not trauma   Relieved by:  Nothing Worsened by:  Nothing tried Ineffective treatments:  None tried Associated symptoms: anorexia, chills, diarrhea, fatigue and nausea   Associated symptoms: no belching, no chest pain, no constipation, no cough, no dysuria, no fever, no flatus, no hematemesis, no hematochezia, no hematuria, no melena, no shortness of breath, no sore throat, no vaginal bleeding, no vaginal discharge and no vomiting   Diarrhea:    Quality:  Watery   Number of occurrences:  Multiple   Severity:  Moderate   Duration:  1 day   Timing:  Intermittent   Progression:  Unchanged Risk factors: obesity   Risk factors: no alcohol abuse, no aspirin use, not elderly, has not had multiple surgeries, no NSAID use, not pregnant and no recent hospitalization     Past Medical History  Diagnosis Date  . Allergy   . Depression   . Anxiety   . Asthma    Past Surgical History  Procedure Laterality Date  . Eye surgery     Family History  Problem Relation Age of Onset  . Depression Mother   . COPD Father   . Depression Maternal Grandmother   . Diabetes Maternal  Grandfather   . Cancer Paternal Grandfather    History  Substance Use Topics  . Smoking status: Former Smoker    Quit date: 04/06/1996  . Smokeless tobacco: Not on file  . Alcohol Use: Yes   OB History   Grav Para Term Preterm Abortions TAB SAB Ect Mult Living                 Review of Systems  Constitutional: Positive for chills and fatigue. Negative for fever.  HENT: Negative for sore throat.   Respiratory: Negative for cough and shortness of breath.   Cardiovascular: Negative for chest pain.  Gastrointestinal: Positive for nausea, abdominal pain, diarrhea and anorexia. Negative for vomiting, constipation, melena, hematochezia, flatus and hematemesis.  Genitourinary: Negative for dysuria, hematuria, vaginal bleeding and vaginal discharge.      Allergies  Azithromycin and Penicillins  Home Medications   Prior to Admission medications   Medication Sig Start Date End Date Taking? Authorizing Provider  Dextroamphetamine Sulfate 20 MG TABS Take 20 mg by mouth daily.    Yes Historical Provider, MD  fish oil-omega-3 fatty acids 1000 MG capsule Take 1 g by mouth daily.   Yes Historical Provider, MD  gabapentin (NEURONTIN) 100 MG capsule Take 100 mg by mouth 3 (three) times daily.   Yes Historical Provider, MD  hydrocortisone cream 0.5 % Apply 1 application topically 2 (two) times daily.   Yes Historical Provider, MD  Multiple Vitamins-Minerals (MULTIVITAMIN WITH MINERALS) tablet Take 1 tablet by mouth daily.   Yes Historical Provider, MD  sertraline (ZOLOFT) 50 MG tablet Take 100 mg by mouth daily.    Yes Historical Provider, MD   BP 117/61  Pulse 66  Temp(Src) 97.9 F (36.6 C) (Oral)  Resp 18  SpO2 99% Physical Exam  Nursing note and vitals reviewed. Constitutional: She is oriented to person, place, and time. She appears well-developed and well-nourished. No distress.  HENT:  Head: Normocephalic and atraumatic.  Mouth/Throat: Oropharynx is clear and moist. No  oropharyngeal exudate.  Eyes: Conjunctivae and EOM are normal. Pupils are equal, round, and reactive to light. No scleral icterus.  Neck: Normal range of motion. Neck supple. No JVD present. No thyromegaly present.  Cardiovascular: Normal rate, regular rhythm, normal heart sounds and intact distal pulses.  Exam reveals no gallop and no friction rub.   No murmur heard. Pulmonary/Chest: Effort normal and breath sounds normal. No respiratory distress. She has no wheezes. She has no rales. She exhibits no tenderness.  Abdominal: Soft. Normal appearance and bowel sounds are normal. There is tenderness in the right lower quadrant. There is rebound and tenderness at McBurney's point. There is no rigidity, no guarding, no CVA tenderness and negative Murphy's sign.  Positive rovsings and positive obturators  Musculoskeletal: Normal range of motion.  Lymphadenopathy:    She has no cervical adenopathy.  Neurological: She is alert and oriented to person, place, and time. No cranial nerve deficit. Coordination normal.  Skin: Skin is warm and dry. She is not diaphoretic.  Psychiatric: She has a normal mood and affect. Her behavior is normal. Judgment and thought content normal.    ED Course  Procedures (including critical care time) Labs Review Labs Reviewed  CBC WITH DIFFERENTIAL - Abnormal; Notable for the following:    WBC 12.0 (*)    RDW 15.8 (*)    Neutro Abs 8.6 (*)    All other components within normal limits  COMPREHENSIVE METABOLIC PANEL - Abnormal; Notable for the following:    Potassium 3.5 (*)    All other components within normal limits  URINALYSIS, ROUTINE W REFLEX MICROSCOPIC - Abnormal; Notable for the following:    Leukocytes, UA TRACE (*)    All other components within normal limits  PREGNANCY, URINE  URINE MICROSCOPIC-ADD ON    Imaging Review Ct Abdomen Pelvis W Contrast  11/04/2013   CLINICAL DATA:  Right lower quadrant abdominal pain.  EXAM: CT ABDOMEN AND PELVIS WITH  CONTRAST  TECHNIQUE: Multidetector CT imaging of the abdomen and pelvis was performed using the standard protocol following bolus administration of intravenous contrast.  CONTRAST:  50mL OMNIPAQUE IOHEXOL 300 MG/ML SOLN, OMNIPAQUE IOHEXOL 300 MG/ML SOLN  COMPARISON:  Pelvic ultrasound dated 12/17/2006.  FINDINGS: Dilated appendix in the right upper pelvis with a maximum diameter of 10.4 mm. Diffuse appendiceal wall thickening and periappendiceal soft tissue stranding. More confluent edema lateral to the appendix. No fluid collections suspicious for abscess formation. No free peritoneal air. Minimally enlarged right lower quadrant mesenteric and right common iliac lymph nodes.  Unremarkable liver, spleen, pancreas, gallbladder, adrenal glands, kidneys and urinary bladder. Intrauterine device within the central uterus. Normal appearing ovaries. Clear lung bases. Mild lumbar spine degenerative changes.  IMPRESSION: Acute appendicitis without abscess.  These results were called by telephone at the time of interpretation on 11/04/2013 at 7:45 pm to [facility] , who verbally acknowledged these results.   Electronically Signed   By: Brett Canales  Azucena Kubaeid M.D.   On: 11/04/2013 19:47     EKG Interpretation None      MDM   Final diagnoses:  Acute appendicitis, unspecified acute appendicitis type   Patient is a 38 y.o. Female who presents to the ED with RLQ pain x 4 days.  Patient has tenderness over McBurney's point, positive rovsings, positive obturators.  There is mild leukocytosis and hypokalemia on labs here.  UA shows trace leukocytes at this time, but patient is not complaining of urinary symptoms.  CT of the abdomen and pelvis reveals acute appendicitis with no abscess at this time.  I have spoken with General surgery at this time with Dr. Daphine DeutscherMartin.  They will see the patient here in the ED.     Eben Burowourtney A Forcucci, PA-C 11/04/13 2112

## 2013-11-04 NOTE — Patient Instructions (Signed)
Appendicitis °Appendicitis is when the appendix is swollen (inflamed). The inflammation can lead to developing a hole (perforation) and a collection of pus (abscess). °CAUSES  °There is not always an obvious cause of appendicitis. Sometimes it is caused by an obstruction in the appendix. The obstruction can be caused by: °· A small, hard, pea-sized ball of stool (fecalith). °· Enlarged lymph glands in the appendix. °SYMPTOMS  °· Pain around your belly button (navel) that moves toward your lower right belly (abdomen). The pain can become more severe and sharp as time passes. °· Tenderness in the lower right abdomen. Pain gets worse if you cough or make a sudden movement. °· Feeling sick to your stomach (nauseous). °· Throwing up (vomiting). °· Loss of appetite. °· Fever. °· Constipation. °· Diarrhea. °· Generally not feeling well. °DIAGNOSIS  °· Physical exam. °· Blood tests. °· Urine test. °· X-rays or a CT scan may confirm the diagnosis. °TREATMENT  °Once the diagnosis of appendicitis is made, the most common treatment is to remove the appendix as soon as possible. This procedure is called appendectomy. In an open appendectomy, a cut (incision) is made in the lower right abdomen and the appendix is removed. In a laparoscopic appendectomy, usually 3 small incisions are made. Long, thin instruments and a camera tube are used to remove the appendix. Most patients go home in 24 to 48 hours after appendectomy. °In some situations, the appendix may have already perforated and an abscess may have formed. The abscess may have a "wall" around it as seen on a CT scan. In this case, a drain may be placed into the abscess to remove fluid, and you may be treated with antibiotic medicines that kill germs. The medicine is given through a tube in your vein (IV). Once the abscess has resolved, it may or may not be necessary to have an appendectomy. You may need to stay in the hospital longer than 48 hours. °Document Released:  03/23/2005 Document Revised: 09/22/2011 Document Reviewed: 06/18/2009 °ExitCare® Patient Information ©2015 [employer]. This information is not intended to replace advice given to you by your health care provider. Make sure you discuss any questions you have with your health care provider. ° °

## 2013-11-04 NOTE — Transfer of Care (Signed)
Immediate Anesthesia Transfer of Care Note  Patient: Tonya PhenixLaurie W Perry  Procedure(s) Performed: Procedure(s) (LRB): APPENDECTOMY LAPAROSCOPIC (N/A)  Patient Location: PACU  Anesthesia Type: General  Level of Consciousness: sedated, patient cooperative and responds to stimulation  Airway & Oxygen Therapy: Patient Spontanous Breathing and Patient connected to face mask oxgen  Post-op Assessment: Report given to PACU RN and Post -op Vital signs reviewed and stable  Post vital signs: Reviewed and stable  Complications: No apparent anesthesia complications

## 2013-11-04 NOTE — Op Note (Signed)
Surgeon: Wenda LowMatt Porsha Skilton, MD, FACS  Asst:  none  Anes:  general  Procedure: Laparoscopic appendectomy  Diagnosis: Acute appendicitis  Complications: none  EBL:   5 cc  Drains: none  Description of Procedure:  [facility]  After anesthesia was administered and the patient was prepped a timeout was performed.  Access was achieved through the umbilicus with Hasson technique.  2 five mm trocars were placed in the right upper quadrant and left lower quadrant.  The appendix was stuck to the terminal ileum and this was bluntly teased away.  The cecum was mobilized with the Harmonic to expose the base of the appendix.  The mesentery of the appendix was transected with the harmonic scalpel and then the base was resected with the 4.5 mm Ethicon staple using a vascular load.  Hemostasis was present.  The port sites were injected with Exparel.  The umbilicus was closed with 0 vicryl with lap visualization.  The incisions were closed with 4-0 vicryl and Dermabond  The patient tolerated the procedure well and was taken to the PACU in stable condition.     Matt B. Daphine DeutscherMartin, MD, [facility], [place] [phone]

## 2013-11-04 NOTE — H&P (Signed)
Chief Complaint:  Right lower quadrant pain since yesterday  History of Present Illness:  Tonya Perry is an 38 y.o. female who presents tonight with worsening right lower quadrant pain.  CT shows appendicitis.   Informed consent about laparoscopic / open appendectomy given to her.    Past Medical History  Diagnosis Date  . Allergy   . Depression   . Anxiety   . Asthma     Past Surgical History  Procedure Laterality Date  . Eye surgery      No current facility-administered medications for this encounter.   Current Outpatient Prescriptions  Medication Sig Dispense Refill  . Dextroamphetamine Sulfate 20 MG TABS Take 20 mg by mouth daily.       . fish oil-omega-3 fatty acids 1000 MG capsule Take 1 g by mouth daily.      Marland Kitchen gabapentin (NEURONTIN) 100 MG capsule Take 100 mg by mouth 3 (three) times daily.      . hydrocortisone cream 0.5 % Apply 1 application topically 2 (two) times daily.      . Multiple Vitamins-Minerals (MULTIVITAMIN WITH MINERALS) tablet Take 1 tablet by mouth daily.      . sertraline (ZOLOFT) 50 MG tablet Take 100 mg by mouth daily.        Azithromycin and Penicillins Family History  Problem Relation Age of Onset  . Depression Mother   . COPD Father   . Depression Maternal Grandmother   . Diabetes Maternal Grandfather   . Cancer Paternal Grandfather    Social History:   reports that she quit smoking about 17 years ago. She does not have any smokeless tobacco history on file. She reports that she drinks alcohol. She reports that she does not use illicit drugs.   REVIEW OF SYSTEMS : Negative except for allergies to multiple meds  Physical Exam:   Blood pressure 117/61, pulse 66, temperature 97.9 F (36.6 C), temperature source Oral, resp. rate 18, SpO2 99.00%. There is no weight on file to calculate BMI.  Gen:  WDWN WF NAD  Neurological: Alert and oriented to person, place, and time. Motor and sensory function is grossly intact  Head:  Normocephalic and atraumatic.  Eyes: Conjunctivae are normal. Pupils are equal, round, and reactive to light. No scleral icterus.  Neck: Normal range of motion. Neck supple. No tracheal deviation or thyromegaly present.  Cardiovascular:  SR without murmurs or gallops.  No carotid bruits Breast:  Not examined Respiratory: Effort normal.  No respiratory distress. No chest wall tenderness. Breath sounds normal.  No wheezes, rales or rhonchi.  Abdomen:  Right lower quadrant abdominal pain to deep palpation GU:  Not examined Musculoskeletal: Normal range of motion. Extremities are nontender. No cyanosis, edema or clubbing noted Lymphadenopathy: No cervical, preauricular, postauricular or axillary adenopathy is present Skin: Skin is warm and dry. No rash noted. No diaphoresis. No erythema. No pallor. Pscyh: Normal mood and affect. Behavior is normal. Judgment and thought content normal.   LABORATORY RESULTS: Results for orders placed during the hospital encounter of 11/04/13 (from the past 48 hour(s))  CBC WITH DIFFERENTIAL     Status: Abnormal   Collection Time    11/04/13  5:11 PM      Result Value Ref Range   WBC 12.0 (*) 4.0 - 10.5 K/uL   RBC 4.52  3.87 - 5.11 MIL/uL   Hemoglobin 12.0  12.0 - 15.0 g/dL   HCT 37.5  36.0 - 46.0 %   MCV 83.0  78.0 -  100.0 fL   MCH 26.5  26.0 - 34.0 pg   MCHC 32.0  30.0 - 36.0 g/dL   RDW 15.8 (*) 11.5 - 15.5 %   Platelets 362  150 - 400 K/uL   Neutrophils Relative % 72  43 - 77 %   Neutro Abs 8.6 (*) 1.7 - 7.7 K/uL   Lymphocytes Relative 21  12 - 46 %   Lymphs Abs 2.5  0.7 - 4.0 K/uL   Monocytes Relative 6  3 - 12 %   Monocytes Absolute 0.8  0.1 - 1.0 K/uL   Eosinophils Relative 1  0 - 5 %   Eosinophils Absolute 0.1  0.0 - 0.7 K/uL   Basophils Relative 0  0 - 1 %   Basophils Absolute 0.0  0.0 - 0.1 K/uL  COMPREHENSIVE METABOLIC PANEL     Status: Abnormal   Collection Time    11/04/13  5:11 PM      Result Value Ref Range   Sodium 140  137 - 147  mEq/L   Potassium 3.5 (*) 3.7 - 5.3 mEq/L   Chloride 102  96 - 112 mEq/L   CO2 25  19 - 32 mEq/L   Glucose, Bld 94  70 - 99 mg/dL   BUN 10  6 - 23 mg/dL   Creatinine, Ser 0.69  0.50 - 1.10 mg/dL   Calcium 9.6  8.4 - 10.5 mg/dL   Total Protein 7.9  6.0 - 8.3 g/dL   Albumin 4.0  3.5 - 5.2 g/dL   AST 22  0 - 37 U/L   ALT 12  0 - 35 U/L   Alkaline Phosphatase 91  39 - 117 U/L   Total Bilirubin 0.3  0.3 - 1.2 mg/dL   GFR calc non Af Amer >90  >90 mL/min   GFR calc Af Amer >90  >90 mL/min   Comment: (NOTE)     The eGFR has been calculated using the CKD EPI equation.     This calculation has not been validated in all clinical situations.     eGFR's persistently <90 mL/min signify possible Chronic Kidney     Disease.   Anion gap 13  5 - 15  URINALYSIS, ROUTINE W REFLEX MICROSCOPIC     Status: Abnormal   Collection Time    11/04/13  5:12 PM      Result Value Ref Range   Color, Urine YELLOW  YELLOW   APPearance CLEAR  CLEAR   Specific Gravity, Urine 1.007  1.005 - 1.030   pH 5.5  5.0 - 8.0   Glucose, UA NEGATIVE  NEGATIVE mg/dL   Hgb urine dipstick NEGATIVE  NEGATIVE   Bilirubin Urine NEGATIVE  NEGATIVE   Ketones, ur NEGATIVE  NEGATIVE mg/dL   Protein, ur NEGATIVE  NEGATIVE mg/dL   Urobilinogen, UA 0.2  0.0 - 1.0 mg/dL   Nitrite NEGATIVE  NEGATIVE   Leukocytes, UA TRACE (*) NEGATIVE  URINE MICROSCOPIC-ADD ON     Status: None   Collection Time    11/04/13  5:12 PM      Result Value Ref Range   Squamous Epithelial / LPF RARE  RARE   WBC, UA 0-2  <3 WBC/hpf   Bacteria, UA RARE  RARE  PREGNANCY, URINE     Status: None   Collection Time    11/04/13  5:51 PM      Result Value Ref Range   Preg Test, Ur NEGATIVE  NEGATIVE   Comment:  THE SENSITIVITY OF THIS     METHODOLOGY IS >20 mIU/mL.     RADIOLOGY RESULTS: Ct Abdomen Pelvis W Contrast  11/04/2013   CLINICAL DATA:  Right lower quadrant abdominal pain.  EXAM: CT ABDOMEN AND PELVIS WITH CONTRAST  TECHNIQUE:  Multidetector CT imaging of the abdomen and pelvis was performed using the standard protocol following bolus administration of intravenous contrast.  CONTRAST:  75m OMNIPAQUE IOHEXOL 300 MG/ML SOLN, 1048mOMNIPAQUE IOHEXOL 300 MG/ML SOLN  COMPARISON:  Pelvic ultrasound dated 12/17/2006.  FINDINGS: Dilated appendix in the right upper pelvis with a maximum diameter of 10.4 mm. Diffuse appendiceal wall thickening and periappendiceal soft tissue stranding. More confluent edema lateral to the appendix. No fluid collections suspicious for abscess formation. No free peritoneal air. Minimally enlarged right lower quadrant mesenteric and right common iliac lymph nodes.  Unremarkable liver, spleen, pancreas, gallbladder, adrenal glands, kidneys and urinary bladder. Intrauterine device within the central uterus. Normal appearing ovaries. Clear lung bases. Mild lumbar spine degenerative changes.  IMPRESSION: Acute appendicitis without abscess.  These results were called by telephone at the time of interpretation on 11/04/2013 at 7:45 pm to [facility] who verbally acknowledged these results.   Electronically Signed   By: StEnrique Sack.D.   On: 11/04/2013 19:47    Problem List: Patient Active Problem List   Diagnosis Date Noted  . Acute appendicitis 11/04/2013    Assessment & Plan: Acute appendicitis Plan laparoscopic appendectomy.      Matt B. MaHassell DoneMD, FAOhsu Hospital And Clinics[facility]gery, P.[facility] [phone] [phone]/04/2013 9:22 PM

## 2013-11-05 ENCOUNTER — Encounter (HOSPITAL_COMMUNITY): Payer: Self-pay | Admitting: *Deleted

## 2013-11-05 LAB — CBC
HCT: 35.9 % — ABNORMAL LOW (ref 36.0–46.0)
Hemoglobin: 11.2 g/dL — ABNORMAL LOW (ref 12.0–15.0)
MCH: 26.2 pg (ref 26.0–34.0)
MCHC: 31.2 g/dL (ref 30.0–36.0)
MCV: 84.1 fL (ref 78.0–100.0)
Platelets: 328 10*3/uL (ref 150–400)
RBC: 4.27 MIL/uL (ref 3.87–5.11)
RDW: 16 % — ABNORMAL HIGH (ref 11.5–15.5)
WBC: 12.5 10*3/uL — ABNORMAL HIGH (ref 4.0–10.5)

## 2013-11-05 MED ORDER — DIPHENHYDRAMINE HCL 50 MG/ML IJ SOLN
INTRAMUSCULAR | Status: AC
  Filled 2013-11-05: qty 1

## 2013-11-05 MED ORDER — OXYCODONE-ACETAMINOPHEN 5-325 MG PO TABS: 1.0000 | ORAL_TABLET | ORAL | Status: DC | PRN

## 2013-11-05 MED ORDER — DIPHENHYDRAMINE HCL 50 MG/ML IJ SOLN
12.5000 mg | Freq: Four times a day (QID) | INTRAMUSCULAR | Status: DC | PRN
  Administered 2013-11-05: 25 mg via INTRAVENOUS

## 2013-11-05 MED ORDER — ACETAMINOPHEN 325 MG PO TABS: 650.0000 mg | ORAL_TABLET | ORAL | Status: DC | PRN

## 2013-11-05 MED ORDER — ADULT MULTIVITAMIN W/MINERALS CH
1.0000 | ORAL_TABLET | Freq: Every day | ORAL | Status: DC
  Filled 2013-11-05: qty 1

## 2013-11-05 MED ORDER — HEPARIN SODIUM (PORCINE) 5000 UNIT/ML IJ SOLN
5000.0000 [IU] | Freq: Three times a day (TID) | INTRAMUSCULAR | Status: DC
  Administered 2013-11-05 (×2): 5000 [IU] via SUBCUTANEOUS
  Filled 2013-11-05 (×5): qty 1

## 2013-11-05 MED ORDER — GABAPENTIN 100 MG PO CAPS
100.0000 mg | ORAL_CAPSULE | Freq: Three times a day (TID) | ORAL | Status: DC
  Filled 2013-11-05 (×3): qty 1

## 2013-11-05 MED ORDER — DEXTROAMPHETAMINE SULFATE 5 MG PO TABS
20.0000 mg | ORAL_TABLET | Freq: Every day | ORAL | Status: DC
  Filled 2013-11-05: qty 4

## 2013-11-05 MED ORDER — DIPHENHYDRAMINE HCL 25 MG PO CAPS: 25.0000 mg | ORAL_CAPSULE | Freq: Four times a day (QID) | ORAL | Status: DC | PRN

## 2013-11-05 MED ORDER — HYDROCODONE-ACETAMINOPHEN 5-325 MG PO TABS: 1.0000 | ORAL_TABLET | ORAL | Status: DC | PRN

## 2013-11-05 MED ORDER — ONDANSETRON HCL 4 MG PO TABS: 4.0000 mg | ORAL_TABLET | Freq: Four times a day (QID) | ORAL | Status: DC | PRN

## 2013-11-05 MED ORDER — HYDROMORPHONE HCL PF 1 MG/ML IJ SOLN: 0.5000 mg | INTRAMUSCULAR | Status: DC | PRN

## 2013-11-05 MED ORDER — LORATADINE 10 MG PO TABS
10.0000 mg | ORAL_TABLET | Freq: Every day | ORAL | Status: DC
  Filled 2013-11-05: qty 1

## 2013-11-05 MED ORDER — OXYCODONE-ACETAMINOPHEN 5-325 MG PO TABS
1.0000 | ORAL_TABLET | ORAL | Status: DC | PRN
  Administered 2013-11-05 (×3): 1 via ORAL
  Filled 2013-11-05 (×3): qty 1

## 2013-11-05 MED ORDER — SERTRALINE HCL 100 MG PO TABS
100.0000 mg | ORAL_TABLET | Freq: Every day | ORAL | Status: DC
  Filled 2013-11-05: qty 1

## 2013-11-05 MED ORDER — ONDANSETRON HCL 4 MG/2ML IJ SOLN: 4.0000 mg | Freq: Four times a day (QID) | INTRAMUSCULAR | Status: DC | PRN

## 2013-11-05 NOTE — Anesthesia Postprocedure Evaluation (Signed)
  Anesthesia Post-op Note  Patient: Tonya Perry  Procedure(s) Performed: Procedure(s) (LRB): APPENDECTOMY LAPAROSCOPIC (N/A)  Patient Location: PACU  Anesthesia Type: General  Level of Consciousness: awake and alert   Airway and Oxygen Therapy: Patient Spontanous Breathing  Post-op Pain: mild  Post-op Assessment: Post-op Vital signs reviewed, Patient's Cardiovascular Status Stable, Respiratory Function Stable, Patent Airway and No signs of Nausea or vomiting  Last Vitals:  Filed Vitals:   11/05/13 0541  BP: 112/70  Pulse: 82  Temp: 36.8 C  Resp: 18    Post-op Vital Signs: stable   Complications: No apparent anesthesia complications

## 2013-11-05 NOTE — Progress Notes (Signed)
Pt concerned about taking percocet since she thinks she has an allergy to codeine. Per pt she has issue with itching, even before yesterday, and is scheduled to see an allergist. I asked pt is she started itching after I gave her percocet, and she stated that she itches so much she could not tell.   Pt requested Zyrtec since that is what she takes at home. Hospital only stocks claritin.  Offered benadryl, which pt accepted. Notified MD, who advised it pt has any problems with percocet, to call office tomorrow. Pt voiced understanding.

## 2013-11-06 ENCOUNTER — Encounter (HOSPITAL_COMMUNITY): Payer: Self-pay | Admitting: Surgery

## 2013-11-07 ENCOUNTER — Telehealth (INDEPENDENT_AMBULATORY_CARE_PROVIDER_SITE_OTHER): Payer: Self-pay

## 2013-11-07 NOTE — Telephone Encounter (Signed)
Message copied by Bert Givans on Tue AugMaryan Puls 4, 2015 11:21 AM ------      Message from: Louie BunASSELL, TONYA      Created: Mon Nov 06, 2013  1:06 PM      Regarding: po appt       Pt had lap chole with Dr Daphine DeutscherMartin on 11/04/13.  Pt called to get a po appt. Pt needs a po for 3 weeks. I didn't see anything open for that time.       Thanks       Tonya ------

## 2013-11-07 NOTE — Telephone Encounter (Signed)
Called and spoke to patient with post op appointment date & time for 11/23/13 @ 11:30 am w/Dr. Daphine DeutscherMartin.

## 2013-11-07 NOTE — Discharge Summary (Signed)
Physician Discharge Summary  Patient ID: Tonya PhenixLaurie W Parham MRN: [id number] DOB/AGE: Apr 14, 1975 38 y.o.  Admit date: 11/04/2013 Discharge date: 11/05/2013  Admission Diagnoses:  appendicitis  Discharge Diagnoses:  same  Active Problems:   Status post laparoscopic appendectomy   S/P laparoscopic appendectomy   Surgery:  Lap appy  Discharged Condition: improved  Hospital Course:   Had lap appy at night.  Ready for discharge the next morning.   Consults: none  Significant Diagnostic Studies: CT scan    Discharge Exam: Blood pressure 112/70, pulse 82, temperature 98.2 F (36.8 C), temperature source Oral, resp. rate 18, height 5\' 4"  (1.626 m), weight 193 lb 15.7 oz (87.99 kg), SpO2 100.00%. Incision OK  Disposition: 01-Home or Self Care  Discharge Instructions   Diet - low sodium heart healthy    Complete by:  As directed      Discharge instructions    Complete by:  As directed   May shower. Diet ad lib     Increase activity slowly    Complete by:  As directed      No wound care    Complete by:  As directed             Medication List         Dextroamphetamine Sulfate 20 MG Tabs  Take 20 mg by mouth daily.     fish oil-omega-3 fatty acids 1000 MG capsule  Take 1 g by mouth daily.     gabapentin 100 MG capsule  Commonly known as:  NEURONTIN  Take 100 mg by mouth 3 (three) times daily.     hydrocortisone cream 0.5 %  Apply 1 application topically 2 (two) times daily.     multivitamin with minerals tablet  Take 1 tablet by mouth daily.     oxyCODONE-acetaminophen 5-325 MG per tablet  Commonly known as:  PERCOCET/ROXICET  Take 1 tablet by mouth every 4 (four) hours as needed for severe pain.     sertraline 50 MG tablet  Commonly known as:  ZOLOFT  Take 100 mg by mouth daily.         SignedValarie Merino: Jazir Newey B 11/07/2013, 9:33 AM

## 2013-11-23 ENCOUNTER — Ambulatory Visit (INDEPENDENT_AMBULATORY_CARE_PROVIDER_SITE_OTHER): Payer: PRIVATE HEALTH INSURANCE | Admitting: Surgery

## 2013-11-23 VITALS — BP 120/80 | HR 83 | Temp 97.1°F | Ht 64.0 in | Wt 194.0 lb

## 2013-11-23 DIAGNOSIS — Z9049 Acquired absence of other specified parts of digestive tract: Secondary | ICD-10-CM

## 2013-11-23 DIAGNOSIS — Z9889 Other specified postprocedural states: Secondary | ICD-10-CM

## 2013-11-23 NOTE — Progress Notes (Signed)
Tonya Perry 38 y.o.  Body mass index is 33.28 kg/(m^2).  Patient Active Problem List   Diagnosis Date Noted  . Status post laparoscopic appendectomy 11/04/2013  . S/P laparoscopic appendectomy 11/04/2013    Allergies  Allergen Reactions  . Azithromycin     diarrhea  . Hydrocodone-Acetaminophen Itching  . Shellfish Allergy   . Penicillins Rash      Past Surgical History  Procedure Laterality Date  . Eye surgery    . Laparoscopic appendectomy N/A 11/04/2013    Procedure: APPENDECTOMY LAPAROSCOPIC;  Surgeon: Valarie MerinoMatthew B Karmin Kasprzak, MD;  Location: [facility];  Service: General;  Laterality: N/A;   No primary provider on file. No diagnosis found.  Path acute appendicitis.  Incisions bland.  Doing well. Return prn.  Matt B. Daphine DeutscherMartin, MD, [facility]  [facility] [phone] beeper [phone]  11/23/2013 12:29 PM

## 2014-01-22 DIAGNOSIS — J309 Allergic rhinitis, unspecified: Secondary | ICD-10-CM | POA: Insufficient documentation

## 2014-01-31 ENCOUNTER — Ambulatory Visit (INDEPENDENT_AMBULATORY_CARE_PROVIDER_SITE_OTHER): Payer: BC Managed Care – PPO | Admitting: Family Medicine

## 2014-01-31 ENCOUNTER — Ambulatory Visit: Payer: BC Managed Care – PPO

## 2014-01-31 ENCOUNTER — Ambulatory Visit (INDEPENDENT_AMBULATORY_CARE_PROVIDER_SITE_OTHER): Payer: BC Managed Care – PPO

## 2014-01-31 VITALS — BP 120/72 | HR 95 | Temp 97.8°F | Resp 18 | Ht 63.0 in | Wt 198.0 lb

## 2014-01-31 DIAGNOSIS — G8929 Other chronic pain: Secondary | ICD-10-CM

## 2014-01-31 DIAGNOSIS — Z9109 Other allergy status, other than to drugs and biological substances: Secondary | ICD-10-CM

## 2014-01-31 DIAGNOSIS — Q682 Congenital deformity of knee: Secondary | ICD-10-CM

## 2014-01-31 DIAGNOSIS — M25561 Pain in right knee: Secondary | ICD-10-CM

## 2014-01-31 DIAGNOSIS — Z91048 Other nonmedicinal substance allergy status: Secondary | ICD-10-CM

## 2014-01-31 DIAGNOSIS — M25562 Pain in left knee: Principal | ICD-10-CM

## 2014-01-31 DIAGNOSIS — D62 Acute posthemorrhagic anemia: Secondary | ICD-10-CM

## 2014-01-31 DIAGNOSIS — H6993 Unspecified Eustachian tube disorder, bilateral: Secondary | ICD-10-CM

## 2014-01-31 DIAGNOSIS — R350 Frequency of micturition: Secondary | ICD-10-CM

## 2014-01-31 DIAGNOSIS — R5383 Other fatigue: Secondary | ICD-10-CM

## 2014-01-31 DIAGNOSIS — M2550 Pain in unspecified joint: Secondary | ICD-10-CM

## 2014-01-31 DIAGNOSIS — G4700 Insomnia, unspecified: Secondary | ICD-10-CM

## 2014-01-31 DIAGNOSIS — E669 Obesity, unspecified: Secondary | ICD-10-CM

## 2014-01-31 DIAGNOSIS — Z0000 Encounter for general adult medical examination without abnormal findings: Secondary | ICD-10-CM

## 2014-01-31 LAB — POCT URINALYSIS DIPSTICK
Bilirubin, UA: NEGATIVE
Blood, UA: NEGATIVE
Glucose, UA: NEGATIVE
Ketones, UA: NEGATIVE
Nitrite, UA: NEGATIVE
PROTEIN UA: NEGATIVE
SPEC GRAV UA: 1.01
Urobilinogen, UA: 0.2
pH, UA: 5

## 2014-01-31 LAB — POCT UA - MICROSCOPIC ONLY
CASTS, UR, LPF, POC: NEGATIVE
Crystals, Ur, HPF, POC: NEGATIVE
Mucus, UA: NEGATIVE
WBC, Ur, HPF, POC: NEGATIVE
Yeast, UA: NEGATIVE

## 2014-01-31 LAB — POCT CBC
Granulocyte percent: 69.1 %G (ref 37–80)
HCT, POC: 39.1 % (ref 37.7–47.9)
Hemoglobin: 12.1 g/dL — AB (ref 12.2–16.2)
Lymph, poc: 2.7 (ref 0.6–3.4)
MCH: 26.6 pg — AB (ref 27–31.2)
MCHC: 31 g/dL — AB (ref 31.8–35.4)
MCV: 85.9 fL (ref 80–97)
MID (CBC): 0.6 (ref 0–0.9)
MPV: 7 fL (ref 0–99.8)
PLATELET COUNT, POC: 339 10*3/uL (ref 142–424)
POC Granulocyte: 7.4 — AB (ref 2–6.9)
POC LYMPH %: 25.7 % (ref 10–50)
POC MID %: 5.2 %M (ref 0–12)
RBC: 4.55 M/uL (ref 4.04–5.48)
RDW, POC: 15.7 %
WBC: 10.7 10*3/uL — AB (ref 4.6–10.2)

## 2014-01-31 MED ORDER — DICLOFENAC SODIUM 1 % TD GEL: 2.0000 g | Freq: Four times a day (QID) | TRANSDERMAL | Status: DC

## 2014-01-31 MED ORDER — AZELASTINE-FLUTICASONE 137-50 MCG/ACT NA SUSP: 1.0000 | Freq: Two times a day (BID) | NASAL | Status: DC

## 2014-01-31 MED ORDER — AZELASTINE HCL 0.15 % NA SOLN: 1.0000 | Freq: Two times a day (BID) | NASAL | Status: DC

## 2014-01-31 NOTE — Patient Instructions (Addendum)
Make sure your tetanus shot is up to date.  Try a glucosamine-chondoiton supplement to help with the knee arthritis.    Keeping You Healthy  Get These Tests 1. Blood Pressure- Have your blood pressure checked once a year by your health care provider.  Normal blood pressure is 120/80. 2. Weight- Have your body mass index (BMI) calculated to screen for obesity.  BMI is measure of body fat based on height and weight.  You can also calculate your own BMI at https://www.west-esparza.com/www.nhlbisupport.com/bmi/. 3. Cholesterol- Have your cholesterol checked every 5 years starting at age 720 then yearly starting at age 38. 4. Chlamydia, HIV, and other sexually transmitted diseases- Get screened every year until age 38, then within three months of each new sexual provider. 5. Pap Smear- Every 1-3 years; discuss with your health care provider. 6. Mammogram- Every year starting at age 38  Take these medicines  Calcium with Vitamin D-Your body needs 1200 mg of Calcium each day and [phone] IU of Vitamin D daily.  Your body can only absorb 500 mg of Calcium at a time so Calcium must be taken in 2 or 3 divided doses throughout the day.  Multivitamin with folic acid- Once daily if it is possible for you to become pregnant.  Get these Immunizations  Gardasil-Series of three doses; prevents HPV related illness such as genital warts and cervical cancer.  Menactra-Single dose; prevents meningitis.  Tetanus shot- Every 10 years.  Flu shot-Every year.  Take these steps 1. Do not smoke-Your healthcare provider can help you quit.  For tips on how to quit go to www.smokefree.gov or call 1-800 QUITNOW. 2. Be physically active- Exercise 5 days a week for at least 30 minutes.  If you are not already physically active, start slow and gradually work up to 30 minutes of moderate physical activity.  Examples of moderate activity include walking briskly, dancing, swimming, bicycling, etc. 3. Breast Cancer- A self breast exam every month is  important for early detection of breast cancer.  For more information and instruction on self breast exams, ask your healthcare provider or [place].eswww.womenshealth.gov/faq/breast-self-exam.cfm. 4. Eat a healthy diet- Eat a variety of healthy foods such as fruits, vegetables, whole grains, low fat milk, low fat cheeses, yogurt, lean meats, poultry and fish, beans, nuts, tofu, etc.  For more information go to www. Thenutritionsource.org 5. Drink alcohol in moderation- Limit alcohol intake to one drink or less per day. Never drink and drive. 6. Depression- Your emotional health is as important as your physical health.  If you're feeling down or losing interest in things you normally enjoy please talk to your healthcare provider about being screened for depression. 7. Dental visit- Brush and floss your teeth twice daily; visit your dentist twice a year. 8. Eye doctor- Get an eye exam at least every 2 years. 9. Helmet use- Always wear a helmet when riding a bicycle, motorcycle, rollerblading or skateboarding. 10. Safe sex- If you may be exposed to sexually transmitted infections, use a condom. 11. Seat belts- Seat belts can save your live; always wear one. 12. Smoke/Carbon Monoxide detectors- These detectors need to be installed on the appropriate level of your home. Replace batteries at least once a year. 13. Skin cancer- When out in the sun please cover up and use sunscreen 15 SPF or higher. 14. Violence- If anyone is threatening or hurting you, please tell your healthcare provider.
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
## 2014-02-01 ENCOUNTER — Telehealth: Payer: Self-pay | Admitting: *Deleted

## 2014-02-01 LAB — TSH: TSH: 1.148 u[IU]/mL (ref 0.350–4.500)

## 2014-02-01 LAB — VITAMIN B12: Vitamin B-12: 697 pg/mL (ref 211–911)

## 2014-02-01 LAB — FERRITIN: Ferritin: 23 ng/mL (ref 10–291)

## 2014-02-01 LAB — VITAMIN D 25 HYDROXY (VIT D DEFICIENCY, FRACTURES): VIT D 25 HYDROXY: 34 ng/mL (ref 30–89)

## 2014-02-01 NOTE — Telephone Encounter (Signed)
Patient phoned stating she had received call from the office that MD needed her to schedule f/u OV.  Appt scheduled for 11am tomorrow (10/30) with Dr. Clelia CroftShaw.

## 2014-02-02 ENCOUNTER — Ambulatory Visit (INDEPENDENT_AMBULATORY_CARE_PROVIDER_SITE_OTHER): Payer: BC Managed Care – PPO

## 2014-02-02 ENCOUNTER — Ambulatory Visit (INDEPENDENT_AMBULATORY_CARE_PROVIDER_SITE_OTHER): Payer: BC Managed Care – PPO | Admitting: Family Medicine

## 2014-02-02 ENCOUNTER — Encounter: Payer: Self-pay | Admitting: Family Medicine

## 2014-02-02 VITALS — BP 120/70 | HR 90 | Temp 97.5°F | Resp 16 | Ht 63.5 in | Wt 197.0 lb

## 2014-02-02 DIAGNOSIS — M6249 Contracture of muscle, multiple sites: Secondary | ICD-10-CM

## 2014-02-02 DIAGNOSIS — D509 Iron deficiency anemia, unspecified: Secondary | ICD-10-CM

## 2014-02-02 DIAGNOSIS — M542 Cervicalgia: Secondary | ICD-10-CM

## 2014-02-02 DIAGNOSIS — M62838 Other muscle spasm: Secondary | ICD-10-CM

## 2014-02-02 DIAGNOSIS — R202 Paresthesia of skin: Secondary | ICD-10-CM

## 2014-02-02 LAB — POCT GLYCOSYLATED HEMOGLOBIN (HGB A1C): HEMOGLOBIN A1C: 5.2

## 2014-02-02 LAB — POCT SEDIMENTATION RATE: POCT SED RATE: 5.2 mm/hr (ref 0–22)
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
## 2014-02-02 NOTE — Patient Instructions (Signed)
Iron-Rich Diet An iron-rich diet contains foods that are good sources of iron. Iron is an important mineral that helps your body produce hemoglobin. Hemoglobin is a protein in red blood cells that carries oxygen to the body's tissues. Sometimes, the iron level in your blood can be low. This may be caused by:  A lack of iron in your diet.  Blood loss.  Times of growth, such as during pregnancy or during a child's growth and development. Low levels of iron can cause a decrease in the number of red blood cells. This can result in iron deficiency anemia. Iron deficiency anemia symptoms include:  Tiredness.  Weakness.  Irritability.  Increased chance of infection. Here are some recommendations for daily iron intake:  Males older than 38 years of age need 8 mg of iron per day.  Women ages 19 to 50 need 18 mg of iron per day.  Pregnant women need 27 mg of iron per day, and women who are over 19 years of age and breastfeeding need 9 mg of iron per day.  Women over the age of 50 need 8 mg of iron per day. SOURCES OF IRON There are 2 types of iron that are found in food: heme iron and nonheme iron. Heme iron is absorbed by the body better than nonheme iron. Heme iron is found in meat, poultry, and fish. Nonheme iron is found in grains, beans, and vegetables. Heme Iron Sources Food / Iron (mg)  Chicken liver, 3 oz (85 g)/ 10 mg  Beef liver, 3 oz (85 g)/ 5.5 mg  Oysters, 3 oz (85 g)/ 8 mg  Beef, 3 oz (85 g)/ 2 to 3 mg  Shrimp, 3 oz (85 g)/ 2.8 mg  Turkey, 3 oz (85 g)/ 2 mg  Chicken, 3 oz (85 g) / 1 mg  Fish (tuna, halibut), 3 oz (85 g)/ 1 mg  Pork, 3 oz (85 g)/ 0.9 mg Nonheme Iron Sources Food / Iron (mg)  Ready-to-eat breakfast cereal, iron-fortified / 3.9 to 7 mg  Tofu,  cup / 3.4 mg  Kidney beans,  cup / 2.6 mg  Baked potato with skin / 2.7 mg  Asparagus,  cup / 2.2 mg  Avocado / 2 mg  Dried peaches,  cup / 1.6 mg  Raisins,  cup / 1.5 mg  Soy milk, 1 cup  / 1.5 mg  Whole-wheat bread, 1 slice / 1.2 mg  Spinach, 1 cup / 0.8 mg  Broccoli,  cup / 0.6 mg IRON ABSORPTION Certain foods can decrease the body's absorption of iron. Try to avoid these foods and beverages while eating meals with iron-containing foods:  Coffee.  Tea.  Fiber.  Soy. Foods containing vitamin C can help increase the amount of iron your body absorbs from iron sources, especially from nonheme sources. Eat foods with vitamin C along with iron-containing foods to increase your iron absorption. Foods that are high in vitamin C include many fruits and vegetables. Some good sources are:  Fresh orange juice.  Oranges.  Strawberries.  Mangoes.  Grapefruit.  Red bell peppers.  Green bell peppers.  Broccoli.  Potatoes with skin.  Tomato juice. Document Released: 11/04/2004 Document Revised: 06/15/2011 Document Reviewed: 09/11/2010 ExitCare Patient Information 2015 [employer]. This information is not intended to replace advice given to you by your health care provider. Make sure you discuss any questions you have with your health care provider. Iron Deficiency Anemia Anemia is a condition in which there are less red blood cells or   hemoglobin in the blood than normal. Hemoglobin is the part of red blood cells that carries oxygen. Iron deficiency anemia is anemia caused by too little iron. It is the most common type of anemia. It may leave you tired and short of breath. CAUSES   Lack of iron in the diet.  Poor absorption of iron, as seen with intestinal disorders.  Intestinal bleeding.  Heavy periods. SIGNS AND SYMPTOMS  Mild anemia may not be noticeable. Symptoms may include:  Fatigue.  Headache.  Pale skin.  Weakness.  Tiredness.  Shortness of breath.  Dizziness.  Cold hands and feet.  Fast or irregular heartbeat. DIAGNOSIS  Diagnosis requires a thorough evaluation and physical exam by your health care provider. Blood tests are  generally used to confirm iron deficiency anemia. Additional tests may be done to find the underlying cause of your anemia. These may include:  Testing for blood in the stool (fecal occult blood test).  A procedure to see inside the colon and rectum (colonoscopy).  A procedure to see inside the esophagus and stomach (endoscopy). TREATMENT  Iron deficiency anemia is treated by correcting the cause of the deficiency. Treatment may involve:  Adding iron-rich foods to your diet.  Taking iron supplements. Pregnant or breastfeeding women need to take extra iron because their normal diet usually does not provide the required amount.  Taking vitamins. Vitamin C improves the absorption of iron. Your health care provider may recommend that you take your iron tablets with a glass of orange juice or vitamin C supplement.  Medicines to make heavy menstrual flow lighter.  Surgery. HOME CARE INSTRUCTIONS   Take iron as directed by your health care provider.  If you cannot tolerate taking iron supplements by mouth, talk to your health care provider about taking them through a vein (intravenously) or an injection into a muscle.  For the best iron absorption, iron supplements should be taken on an empty stomach. If you cannot tolerate them on an empty stomach, you may need to take them with food.  Do not drink milk or take antacids at the same time as your iron supplements. Milk and antacids may interfere with the absorption of iron.  Iron supplements can cause constipation. Make sure to include fiber in your diet to prevent constipation. A stool softener may also be recommended.  Take vitamins as directed by your health care provider.  Eat a diet rich in iron. Foods high in iron include liver, lean beef, whole-grain bread, eggs, dried fruit, and dark green leafy vegetables. SEEK IMMEDIATE MEDICAL CARE IF:   You faint. If this happens, do not drive. Call your local emergency services (911 in [place]  if no other help is available.  You have chest pain.  You feel nauseous or vomit.  You have severe or increased shortness of breath with activity.  You feel weak.  You have a rapid heartbeat.  You have unexplained sweating.  You become light-headed when getting up from a chair or bed. MAKE SURE YOU:   Understand these instructions.  Will watch your condition.  Will get help right away if you are not doing well or get worse. Document Released: 03/20/2000 Document Revised: 03/28/2013 Document Reviewed: 11/28/2012 ExitCare Patient Information 2015 [employer]. This information is not intended to replace advice given to you by your health care provider. Make sure you discuss any questions you have with your health care provider.  

## 2014-02-03 LAB — COMPREHENSIVE METABOLIC PANEL
ALT: 13 U/L (ref 0–35)
AST: 15 U/L (ref 0–37)
Albumin: 4.2 g/dL (ref 3.5–5.2)
Alkaline Phosphatase: 76 U/L (ref 39–117)
BUN: 12 mg/dL (ref 6–23)
CALCIUM: 9.2 mg/dL (ref 8.4–10.5)
CHLORIDE: 106 meq/L (ref 96–112)
CO2: 23 mEq/L (ref 19–32)
CREATININE: 0.68 mg/dL (ref 0.50–1.10)
Glucose, Bld: 78 mg/dL (ref 70–99)
Potassium: 3.8 mEq/L (ref 3.5–5.3)
Sodium: 139 mEq/L (ref 135–145)
Total Bilirubin: 0.2 mg/dL (ref 0.2–1.2)
Total Protein: 7.1 g/dL (ref 6.0–8.3)

## 2014-02-03 LAB — RPR

## 2014-02-03 LAB — C-REACTIVE PROTEIN: CRP: <0.5 mg/dL (ref <0.60)

## 2014-02-03 LAB — IRON AND TIBC
%SAT: 7 % — AB (ref 20–55)
Iron: 23 ug/dL — ABNORMAL LOW (ref 42–145)
TIBC: 320 ug/dL (ref 250–470)
UIBC: 297 ug/dL (ref 125–400)

## 2014-02-05 LAB — ANA: Anti Nuclear Antibody(ANA): NEGATIVE

## 2014-02-06 ENCOUNTER — Encounter: Payer: Self-pay | Admitting: Family Medicine

## 2014-02-08 ENCOUNTER — Encounter: Payer: Self-pay | Admitting: Family Medicine

## 2014-02-13 ENCOUNTER — Encounter: Payer: Self-pay | Admitting: Neurology

## 2014-02-13 ENCOUNTER — Ambulatory Visit (INDEPENDENT_AMBULATORY_CARE_PROVIDER_SITE_OTHER): Payer: BC Managed Care – PPO | Admitting: Neurology

## 2014-02-13 VITALS — BP 122/82 | HR 95 | Temp 98.8°F | Ht 63.0 in | Wt 200.0 lb

## 2014-02-13 DIAGNOSIS — R202 Paresthesia of skin: Secondary | ICD-10-CM

## 2014-02-13 DIAGNOSIS — E669 Obesity, unspecified: Secondary | ICD-10-CM

## 2014-02-13 DIAGNOSIS — G5600 Carpal tunnel syndrome, unspecified upper limb: Secondary | ICD-10-CM

## 2014-02-13 NOTE — Patient Instructions (Signed)
You may have a condition called peripheral neuropathy, i. e. nerve damage, but symptoms and findings are rather mild and could be in keeping with carpal tunnel syndrome in your hands (does not explain your toe tingling). There is no specific treatment for most neuropathies. The most common cause for neuropathy is diabetes in this country, in which case, tight glucose control is key. Other causes include thyroid disease, and some vitamin deficiencies. Certain medications such as chemotherapy agents and other chemicals or toxins including alcohol can cause neuropathy. There are some genetic conditions or hereditary neuropathies. Typically patients will report a family history of neuropathy in those conditions. There are cases associated with cancers and autoimmune conditions. Most neuropathies are progressive unless a root cause can be found and treated. For most neuropathies there is no actual cure or reversing of symptoms. Painful neuropathy can be difficult to treat symptomatically, but there are some medications available to ease the symptoms. Electrophysiologic testing with nerve conduction velocity studies and EMG (muscle testing) do not always pick up neuropathies that affect the smallest fibers. Other common tests include different type of blood work, and rarely, spinal fluid testing, and sometimes we resort to asking for a nerve and muscle biopsy.  I would like to investigate things further to look for evidence of neuropathy or nerve damage; therefore, I would like to do some blood work, and electrical testing of your muscles and nerves, which is known as EMG/NCV.  I will see you back in about a month.

## 2014-02-13 NOTE — Progress Notes (Signed)
Subjective:    Patient ID: Tonya PhenixLaurie W Perry is a 38 y.o. female.  HPI     Huston FoleySaima Francheska Villeda, MD, PhD [facility] [address], [address] [address] [place], [place] [address]  Dear Dr. Clelia CroftShaw   I saw your patient, Tonya Tonya Perry, upon your kind request in my neurologic clinic today for initial consultation of her upper and lower extremity paresthesias. The patient is unaccompanied today. As you know, Mr. Tonya Perry is a 38 year old right-handed woman with an underlying medical history of allergies, depression, anxiety, asthma, arthritis and history of substance abuse (long time ago, EtOH, and marijuana), who reports a one-year history of tingling and numbness in both hands, primarily in the fingers, L worse than R. She also has noted tingling and numbness in both feet in the last 3 or 4 months, mostly affecting the toes, b/l. She saw you in October and you suggested wrist splints to see if her symptoms improve in her hands. She was referred to orthopedics and physical therapy.  Neck x-ray on 02/05/2014 showed mild reversal of lordotic curvature. This finding is most likely due to muscle spasm. She has not seen orthopedics yet. She has not started physical therapy yet.  The splints help some, but currently she is only using the L one, at night.  Her mother has a Hx of CTS and had surgeries, and sister has Sx of CTS. Mom has neck degenerative neck disease.  She has had extensive blood work on 01/31/2014 including RPR negative, TSH normal, hemoglobin A1c normal at 5.2, normal CRP, negative ANA. B12 was normal as well. Vitamin D was on the lower end of the spectrum at 2034.  Her Past Medical History Is Significant For: Past Medical History  Diagnosis Date  . Allergy   . Depression   . Anxiety   . Asthma   . Arthritis   . Substance abuse     Her Past Surgical History Is Significant For: Past Surgical History  Procedure Laterality Date  . Eye surgery Bilateral 2006  .  Laparoscopic appendectomy N/A 11/04/2013    Procedure: APPENDECTOMY LAPAROSCOPIC;  Surgeon: Valarie MerinoMatthew B Martin, MD;  Location: [facility];  Service: General;  Laterality: N/A;  . Appendectomy  8/15    Her Family History Is Significant For: Family History  Problem Relation Age of Onset  . Depression Mother   . Diabetes Mother   . Mental illness Mother   . COPD Father   . Mental illness Father   . Hypertension Father   . Hyperlipidemia Father   . Depression Maternal Grandmother   . Mental illness Maternal Grandmother   . Diabetes Maternal Grandfather   . Mental illness Maternal Grandfather   . Cancer Paternal Grandfather   . Mental illness Paternal Grandfather   . Mental illness Sister   . Mental illness Sister     Her Social History Is Significant For: History   Social History  . Marital Status: Married    Spouse Name: [name]    Number of Children: 1  . Years of Education: Assoc   Occupational History  .      not employed   Social History Main Topics  . Smoking status: Former Smoker    Quit date: 04/06/1996  . Smokeless tobacco: Never Used  . Alcohol Use: 0.0 oz/week    0 Not specified per week     Comment: occas.  . Drug Use: No  . Sexual Activity: Yes   Other Topics Concern  .  None   Social History Narrative   Patient consumes 4-5 cups of caffeine daily.    Her Allergies Are:  Allergies  Allergen Reactions  . Azithromycin Diarrhea    diarrhea  . Hydrocodone-Acetaminophen Itching  . Other Itching and Other (See Comments)    Peas, Mango, Melons, Cantelope, Watermelons.  Dust mites, trees, grass and pollen.  Causes throat to swell with itching. Tree nuts, causes throat to swell with itching.  Marland Kitchen. Pineapple Other (See Comments)    Causes throat to swell with itching.  . Shellfish Allergy   . Penicillins Rash  :   Her Current Medications Are:  Outpatient Encounter Prescriptions as of 02/13/2014  Medication Sig  . acetaminophen (TYLENOL) 500 MG tablet Take 500  mg by mouth.  . Azelastine HCl 0.15 % SOLN Place 1 spray into the nose 2 (two) times daily.  . cetirizine (ZYRTEC) 10 MG tablet Take 10 mg by mouth daily.  Marland Kitchen. Dextroamphetamine Sulfate 20 MG TABS Take 20 mg by mouth daily.   . diclofenac sodium (VOLTAREN) 1 % GEL Apply 2 g topically 4 (four) times daily.  . diphenhydrAMINE (BENADRYL) 25 mg capsule Take 25 mg by mouth Nightly. May do 50 mg  . EPIPEN 2-PAK 0.3 MG/0.3ML SOAJ injection   . fish oil-omega-3 fatty acids 1000 MG capsule Take 1 g by mouth daily.  . fluticasone (FLONASE) 50 MCG/ACT nasal spray   . gabapentin (NEURONTIN) 300 MG capsule Take 300 mg by mouth 4 (four) times daily.   . Multiple Vitamins-Minerals (MULTIVITAMIN WITH MINERALS) tablet Take 1 tablet by mouth daily.  Marland Kitchen. PROAIR HFA 108 (90 BASE) MCG/ACT inhaler   . sertraline (ZOLOFT) 100 MG tablet Take 200 mg by mouth daily.  Marland Kitchen. Spacer/Aero-Holding [facility](OPTICHAMBER DIAMOND) MISC   . [DISCONTINUED] levocetirizine (XYZAL) 5 MG tablet Take 5 mg by mouth as needed for allergies.  :   Review of Systems:  Out of a complete 14 point review of systems, all are reviewed and negative with the exception of these symptoms as listed below:  Review of Systems  Constitutional: Positive for fever.       Weight gain  HENT:       Ringing in ears, spinning sensation  Eyes:       Blurred vision  Cardiovascular: Positive for palpitations.  Musculoskeletal:       Cramps  Skin:       itching  Allergic/Immunologic: Positive for environmental allergies and food allergies.       Skin sensitivity  Neurological: Positive for dizziness, weakness, numbness and headaches.       Memory loss,sleepiness  Hematological:       Anemia  Psychiatric/Behavioral: Positive for confusion.       Depression, anxiety,not enough sleep, change in appetite, disinterest in activities    Objective:  Neurologic Exam  Physical Exam Physical Examination:   Filed Vitals:   02/13/14 0859  BP: 122/82   Pulse: 95  Temp: 98.8 F (37.1 C)    General Examination: The patient is a very pleasant 38 y.o. female in no acute distress. She appears well-developed and well-nourished and very well groomed. She is overweight.   HEENT: Normocephalic, atraumatic, pupils are equal, round and reactive to light and accommodation. Funduscopic exam is normal with sharp disc margins noted. Extraocular tracking is good without limitation to gaze excursion or nystagmus noted. Normal smooth pursuit is noted. Hearing is grossly intact. Tympanic membranes are clear bilaterally. Face is symmetric with normal facial animation and normal  facial sensation. Speech is clear with no dysarthria noted. There is no hypophonia. There is no lip, neck/head, jaw or voice tremor. Neck is supple with full range of passive and active motion. There are no carotid bruits on auscultation. 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 Mallampati is class III. Tongue protrudes centrally and palate elevates symmetrically.   Chest: Clear to auscultation without wheezing, rhonchi or crackles noted.  Heart: S1+S2+0, regular and normal without murmurs, rubs or gallops noted.   Abdomen: Soft, non-tender and non-distended with normal bowel sounds appreciated on auscultation.  Extremities: There is no pitting edema in the distal lower extremities bilaterally. Pedal pulses are intact.  Skin: Warm and dry without trophic changes noted. There are no varicose veins.  Musculoskeletal: exam reveals no obvious joint deformities, tenderness or joint swelling or erythema, she does report mild bilateral knee pain.   Neurologically:  Mental status: The patient is awake, alert and oriented in all 4 spheres. Her immediate and remote memory, attention, language skills and fund of knowledge are appropriate. There is no evidence of aphasia,  agnosia, apraxia or anomia. Speech is clear with normal prosody and enunciation. Thought process is linear. Mood is normal and affect is normal.  Cranial nerves II - XII are as described above under HEENT exam. In addition: shoulder shrug is normal with equal shoulder height noted. Motor exam: Normal bulk, strength and tone is noted. There is no atrophy in the thenar eminences. She has a mildly positive Phalen's test on the left and mildly positive Tinel's sign on the left. There is no drift, tremor or rebound. Romberg is negative. Reflexes are 2+ throughout. Babinski: Toes are flexor bilaterally. Fine motor skills and coordination: intact with normal finger taps, normal hand movements, normal rapid alternating patting, normal foot taps and normal foot agility.  Cerebellar testing: No dysmetria or intention tremor on finger to nose testing. Heel to shin is unremarkable bilaterally. There is no truncal or gait ataxia.  Sensory exam: intact to light touch, pinprick, vibration, temperature sense and proprioception in the upper and lower extremities, with the exception of mild decrease in PP sense in the L 5th toe.  Gait, station and balance: She stands easily. No veering to one side is noted. No leaning to one side is noted. Posture is age-appropriate and stance is narrow based. Gait shows normal stride length and normal pace. No problems turning are noted. She turns en bloc. Tandem walk is unremarkable. Intact toe and heel stance is noted.               Assessment and Plan:   In summary, Tonya Perry is a very pleasant 38 y.o.-year old female with an underlying medical history of allergies, depression, anxiety, asthma, arthritis and remote history of substance abuse, who reports a one-year history of tingling and numbness in both hands, primarily in the fingers, L worse than R. She also has noted tingling and numbness in both feet in the last 3 or 4 months, mostly affecting the toes, b/l. Her history  and physical exam are most consistent with carpal tunnel syndrome, left primarily. I cannot explain her tingling and intermittent numbness in her fingers and toes otherwise. She does not have a family history of neuropathy and no risk factors for neuropathy such as diabetes. Blood work recently was extensive and negative. She is mildly anemic and was encouraged  to start an iron supplement which she has not started yet. She may benefit from vitamin D supplementation as her number was on the lower end of the spectrum. I suggested 1000 units daily over-the-counter. At this juncture, her exam is reassuring. She has mild decrease in pinprick sensation in the left fifth toe only. Otherwise, exam is consistent with carpal tunnel syndrome for which she has been using a splint. I talked to her about her neck x-ray which was essentially normal. I would like to proceed with additional blood work which we will do today to include more workup for neuropathy. We will also proceed with an EMG and nerve conduction test. She has been referred to orthopedics and physical therapy which both are pending. We will call her with her test results and I will see her back soon after her tests are done.  I answered all her questions today and the patient was in agreement with the above outlined plan. Thank you very much for allowing me to participate in the care of this nice patient. If I can be of any further assistance to you please do not hesitate to call me at [phone].  Sincerely,   Huston Foley, MD, PhD

## 2014-02-14 ENCOUNTER — Ambulatory Visit: Payer: BC Managed Care – PPO | Admitting: Neurology

## 2014-02-14 ENCOUNTER — Encounter: Payer: Self-pay | Admitting: Family Medicine

## 2014-02-16 LAB — VITAMIN B1, WHOLE BLOOD: Thiamine: 151.3 nmol/L (ref 66.5–200.0)

## 2014-02-16 LAB — IFE AND PE, SERUM
ALBUMIN/GLOB SERPL: 1.5 (ref 0.7–2.0)
Albumin SerPl Elph-Mcnc: 4.1 g/dL (ref 3.2–5.6)
Alpha 1: 0.2 g/dL (ref 0.1–0.4)
Alpha2 Glob SerPl Elph-Mcnc: 0.6 g/dL (ref 0.4–1.2)
B-Globulin SerPl Elph-Mcnc: 1 g/dL (ref 0.6–1.3)
Gamma Glob SerPl Elph-Mcnc: 1.1 g/dL (ref 0.5–1.6)
Globulin, Total: 2.9 g/dL (ref 2.0–4.5)
IGA/IMMUNOGLOBULIN A, SERUM: 313 mg/dL (ref 91–414)
IgG (Immunoglobin G), Serum: 1249 mg/dL (ref 700–1600)
IgM (Immunoglobulin M), Srm: 81 mg/dL (ref 40–230)
Total Protein: 7 g/dL (ref 6.0–8.5)

## 2014-02-16 LAB — VITAMIN E: VITAMIN E (ALPHA TOCOPHEROL): 11.3 mg/L (ref 4.6–17.8)

## 2014-02-16 LAB — VITAMIN B6: Vitamin B6: 17.6 ug/L (ref 2.0–32.8)

## 2014-02-16 NOTE — Progress Notes (Signed)
Quick Note:  Please advise patient that her blood work was normal. We checked vitamin E, vitamin B1, vitamin B6, and protein electrophoresis. Huston FoleySaima Cheyanne Lamison, MD, PhD [facility] ([facility]  ______

## 2014-02-23 ENCOUNTER — Encounter (INDEPENDENT_AMBULATORY_CARE_PROVIDER_SITE_OTHER): Payer: Self-pay

## 2014-02-23 ENCOUNTER — Ambulatory Visit (INDEPENDENT_AMBULATORY_CARE_PROVIDER_SITE_OTHER): Payer: BC Managed Care – PPO | Admitting: Neurology

## 2014-02-23 DIAGNOSIS — E669 Obesity, unspecified: Secondary | ICD-10-CM

## 2014-02-23 DIAGNOSIS — G5600 Carpal tunnel syndrome, unspecified upper limb: Secondary | ICD-10-CM

## 2014-02-23 DIAGNOSIS — R202 Paresthesia of skin: Secondary | ICD-10-CM

## 2014-02-23 DIAGNOSIS — Z0289 Encounter for other administrative examinations: Secondary | ICD-10-CM

## 2014-02-23 NOTE — Procedures (Signed)
     HISTORY:  Tonya Perry is a 38 year old patient with a history of numbness and tingling sensations in the hands dating back about one year. Over the last 3-4 months, the feet have also become numb and tingly. She denies any weakness or balance changes. No reports of neck pain or low back pain have been noted. She is being evaluated for these sensory changes.  NERVE CONDUCTION STUDIES:  Nerve conduction studies were performed on both upper extremities. The distal motor latencies and motor amplitudes for the median and ulnar nerves were within normal limits. The F wave latencies and nerve conduction velocities for these nerves were also normal. The sensory latencies for the median and ulnar nerves were normal.  Nerve conduction studies were performed on the right lower extremity. The distal motor latencies and motor amplitudes for the peroneal and posterior tibial nerves were within normal limits. The nerve conduction velocities for these nerves were also normal. The sensory latency for the peroneal nerve was within normal limits.   EMG STUDIES:  EMG study was performed on the right upper extremity:  The first dorsal interosseous muscle reveals 2 to 4 K units with full recruitment. No fibrillations or positive waves were noted. The abductor pollicis brevis muscle reveals 2 to 4 K units with full recruitment. No fibrillations or positive waves were noted. The extensor indicis proprius muscle reveals 1 to 3 K units with full recruitment. No fibrillations or positive waves were noted. The pronator teres muscle reveals 2 to 3 K units with full recruitment. No fibrillations or positive waves were noted. The biceps muscle reveals 1 to 2 K units with full recruitment. No fibrillations or positive waves were noted. The triceps muscle reveals 2 to 4 K units with full recruitment. No fibrillations or positive waves were noted. The anterior deltoid muscle reveals 2 to 3 K units with full  recruitment. No fibrillations or positive waves were noted. The cervical paraspinal muscles were tested at 2 levels. No abnormalities of insertional activity were seen at either level tested. There was good relaxation.  EMG study was performed on the right lower extremity:  The tibialis anterior muscle reveals 2 to 4K motor units with full recruitment. No fibrillations or positive waves were seen. The peroneus tertius muscle reveals 2 to 4K motor units with full recruitment. No fibrillations or positive waves were seen. The medial gastrocnemius muscle reveals 1 to 3K motor units with full recruitment. No fibrillations or positive waves were seen. The vastus lateralis muscle reveals 2 to 4K motor units with full recruitment. No fibrillations or positive waves were seen. The iliopsoas muscle reveals 2 to 4K motor units with full recruitment. No fibrillations or positive waves were seen. The biceps femoris muscle (long head) reveals 2 to 4K motor units with full recruitment. No fibrillations or positive waves were seen. The lumbosacral paraspinal muscles were tested at 3 levels, and revealed no abnormalities of insertional activity at all 3 levels tested. There was good relaxation.   IMPRESSION:  Nerve conduction studies done on both upper extremities and the right lower extremity were unremarkable, without evidence of a peripheral neuropathy. A small fiber neuropathy may be missed by standard nerve conduction studies, however. Clinical correlation is required. In the right upper and right lower extremities were unremarkable, without evidence of overlying cervical or lumbosacral radiculopathies.  Marlan Palau. Keith Lawrance Wiedemann MD 02/23/2014 1:44 PM  [facility] [address] [place], [place] [id number]  Phone [phone] Fax [phone]

## 2014-02-23 NOTE — Progress Notes (Signed)
Quick Note:  Please call and advise the patient that the recent EMG and nerve conduction velocity test, which is the electrical nerve and muscle test we we performed, was reported as within normal limits. We checked for abnormal electrical discharges in the muscles or nerves and the report suggested normal findings. No further action is required on this test at this time. Please remind patient to keep any upcoming appointments or tests and to call us with any interim questions, concerns, problems or updates. Thanks,  Everitt Wenner, MD, PhD   ______ 

## 2014-02-26 ENCOUNTER — Telehealth: Payer: Self-pay | Admitting: *Deleted

## 2014-02-26 NOTE — Telephone Encounter (Signed)
Returned call to patient,shared EMG results per Dr Teofilo PodAthar's findings, she verbalized understanding

## 2014-02-26 NOTE — Telephone Encounter (Signed)
Patient calling returning your call best contact number is [phone]

## 2014-03-12 ENCOUNTER — Telehealth: Payer: Self-pay | Admitting: Neurology

## 2014-03-12 NOTE — Telephone Encounter (Signed)
Thankfully, workup with blood work and electrical nerve and muscle testing was reassuring. If okay with her we can play it by ear.

## 2014-03-12 NOTE — Telephone Encounter (Signed)
Patient questioning if she really need to keep appointment scheduled 12/10.  Please call and advise.

## 2014-03-13 NOTE — Telephone Encounter (Signed)
Called and shared message with patient per Dr Frances FurbishAthar, she verbalized understanding and said that there is probably nothing else she can do for her at this point and cancelled , will call back to reschedule if needed

## 2014-03-15 ENCOUNTER — Ambulatory Visit: Payer: BC Managed Care – PPO | Admitting: Neurology

## 2014-06-11 ENCOUNTER — Encounter: Payer: Self-pay | Admitting: Family Medicine

## 2014-06-13 ENCOUNTER — Encounter: Payer: Self-pay | Admitting: Family Medicine

## 2014-07-06 ENCOUNTER — Ambulatory Visit (INDEPENDENT_AMBULATORY_CARE_PROVIDER_SITE_OTHER): Payer: BLUE CROSS/BLUE SHIELD | Admitting: Family Medicine

## 2014-07-06 ENCOUNTER — Encounter: Payer: Self-pay | Admitting: Family Medicine

## 2014-07-06 VITALS — BP 115/77 | HR 91 | Temp 97.9°F | Resp 16 | Ht 63.0 in | Wt 199.2 lb

## 2014-07-06 DIAGNOSIS — B353 Tinea pedis: Secondary | ICD-10-CM | POA: Diagnosis not present

## 2014-07-06 DIAGNOSIS — F1910 Other psychoactive substance abuse, uncomplicated: Secondary | ICD-10-CM | POA: Diagnosis not present

## 2014-07-06 DIAGNOSIS — G609 Hereditary and idiopathic neuropathy, unspecified: Secondary | ICD-10-CM | POA: Diagnosis not present

## 2014-07-06 DIAGNOSIS — F32A Depression, unspecified: Secondary | ICD-10-CM

## 2014-07-06 DIAGNOSIS — B351 Tinea unguium: Secondary | ICD-10-CM

## 2014-07-06 DIAGNOSIS — M1990 Unspecified osteoarthritis, unspecified site: Secondary | ICD-10-CM

## 2014-07-06 DIAGNOSIS — F329 Major depressive disorder, single episode, unspecified: Secondary | ICD-10-CM | POA: Diagnosis not present

## 2014-07-06 DIAGNOSIS — Z79899 Other long term (current) drug therapy: Secondary | ICD-10-CM

## 2014-07-06 DIAGNOSIS — M542 Cervicalgia: Secondary | ICD-10-CM

## 2014-07-06 DIAGNOSIS — R600 Localized edema: Secondary | ICD-10-CM | POA: Diagnosis not present

## 2014-07-06 DIAGNOSIS — M79672 Pain in left foot: Secondary | ICD-10-CM

## 2014-07-06 DIAGNOSIS — M79671 Pain in right foot: Secondary | ICD-10-CM | POA: Diagnosis not present

## 2014-07-06 DIAGNOSIS — F419 Anxiety disorder, unspecified: Secondary | ICD-10-CM

## 2014-07-06 DIAGNOSIS — G894 Chronic pain syndrome: Secondary | ICD-10-CM | POA: Diagnosis not present

## 2014-07-06 DIAGNOSIS — D509 Iron deficiency anemia, unspecified: Secondary | ICD-10-CM | POA: Diagnosis not present

## 2014-07-06 DIAGNOSIS — M792 Neuralgia and neuritis, unspecified: Secondary | ICD-10-CM

## 2014-07-06 DIAGNOSIS — Q682 Congenital deformity of knee: Secondary | ICD-10-CM

## 2014-07-06 LAB — CBC WITH DIFFERENTIAL/PLATELET
BASOS ABS: 0.1 10*3/uL (ref 0.0–0.1)
Basophils Relative: 1 % (ref 0–1)
Eosinophils Absolute: 0.2 10*3/uL (ref 0.0–0.7)
Eosinophils Relative: 2 % (ref 0–5)
HEMATOCRIT: 39.9 % (ref 36.0–46.0)
HEMOGLOBIN: 12.8 g/dL (ref 12.0–15.0)
LYMPHS ABS: 2.4 10*3/uL (ref 0.7–4.0)
LYMPHS PCT: 29 % (ref 12–46)
MCH: 27.4 pg (ref 26.0–34.0)
MCHC: 32.1 g/dL (ref 30.0–36.0)
MCV: 85.3 fL (ref 78.0–100.0)
MONOS PCT: 6 % (ref 3–12)
MPV: 9.1 fL (ref 8.6–12.4)
Monocytes Absolute: 0.5 10*3/uL (ref 0.1–1.0)
NEUTROS PCT: 62 % (ref 43–77)
Neutro Abs: 5.1 10*3/uL (ref 1.7–7.7)
Platelets: 446 10*3/uL — ABNORMAL HIGH (ref 150–400)
RBC: 4.68 MIL/uL (ref 3.87–5.11)
RDW: 15.6 % — ABNORMAL HIGH (ref 11.5–15.5)
WBC: 8.3 10*3/uL (ref 4.0–10.5)

## 2014-07-06 MED ORDER — FLUTICASONE PROPIONATE 50 MCG/ACT NA SUSP: 2.0000 | Freq: Every day | NASAL | Status: DC

## 2014-07-06 NOTE — Patient Instructions (Addendum)
Let me know where you would like to go for your next speciality evaluation -  Dr. Humphrey Rolls or Dr. Raeanne Barry are good orthopedists who dont just focus on surgery.  They would be really good to get opinions on your knees. Dr. Sharin Grave is an excellent rheumatologist who could give [place] insight on possibility of fibromyalgia or other rheumatologist causes we might have missed. Dr. Nickola Major or Dr. Claudette Laws are great pain doctors who might be able to give you (and me) some ideas of what methods and treatments you might respond best to over the long hall.  Chronic Pain Chronic pain can be defined as pain that is off and on and lasts for 3-6 months or longer. Many things cause chronic pain, which can make it difficult to make a diagnosis. There are many treatment options available for chronic pain. However, finding a treatment that works well for you may require trying various approaches until the right one is found. Many people benefit from a combination of two or more types of treatment to control their pain. SYMPTOMS  Chronic pain can occur anywhere in the body and can range from mild to very severe. Some types of chronic pain include:  Headache.  Low back pain.  Cancer pain.  Arthritis pain.  Neurogenic pain. This is pain resulting from damage to nerves. People with chronic pain may also have other symptoms such as:  Depression.  Anger.  Insomnia.  Anxiety. DIAGNOSIS  Your health care provider will help diagnose your condition over time. In many cases, the initial focus will be on excluding possible conditions that could be causing the pain. Depending on your symptoms, your health care provider may order tests to diagnose your condition. Some of these tests may include:   Blood tests.   CT scan.   MRI.   X-rays.   Ultrasounds.   Nerve conduction studies.  You may need to see a specialist.  TREATMENT  Finding treatment that works well may take time. You may be  referred to a pain specialist. He or she may prescribe medicine or therapies, such as:   Mindful meditation or yoga.  Shots (injections) of numbing or pain-relieving medicines into the spine or area of pain.  Local electrical stimulation.  Acupuncture.   Massage therapy.   Aroma, color, light, or sound therapy.   Biofeedback.   Working with a physical therapist to keep from getting stiff.   Regular, gentle exercise.   Cognitive or behavioral therapy.   Group support.  Sometimes, surgery may be recommended.  HOME CARE INSTRUCTIONS   Take all medicines as directed by your health care provider.   Lessen stress in your life by relaxing and doing things such as listening to calming music.   Exercise or be active as directed by your health care provider.   Eat a healthy diet and include things such as vegetables, fruits, fish, and lean meats in your diet.   Keep all follow-up appointments with your health care provider.   Attend a support group with others suffering from chronic pain. SEEK MEDICAL CARE IF:   Your pain gets worse.   You develop a new pain that was not there before.   You cannot tolerate medicines given to you by your health care provider.   You have new symptoms since your last visit with your health care provider.  SEEK IMMEDIATE MEDICAL CARE IF:   You feel weak.   You have decreased sensation or numbness.   You lose control  of bowel or bladder function.   Your pain suddenly gets much worse.   You develop shaking.  You develop chills.  You develop confusion.  You develop chest pain.  You develop shortness of breath.  MAKE SURE YOU:  Understand these instructions.  Will watch your condition.  Will get help right away if you are not doing well or get worse. Document Released: 12/13/2001 Document Revised: 11/23/2012 Document Reviewed: 09/16/2012 [facility] Patient Information 2015 [place] [place]. This information is  not intended to replace advice given to you by your health care provider. Make sure you discuss any questions you have with your health care provider. Fibromyalgia Fibromyalgia is a disorder that is often misunderstood. It is associated with muscular pains and tenderness that comes and goes. It is often associated with fatigue and sleep disturbances. Though it tends to be long-lasting, fibromyalgia is not life-threatening. CAUSES  The exact cause of fibromyalgia is unknown. People with certain gene types are predisposed to developing fibromyalgia and other conditions. Certain factors can play a role as triggers, such as:  Spine disorders.  Arthritis.  Severe injury (trauma) and other physical stressors.  Emotional stressors. SYMPTOMS   The main symptom is pain and stiffness in the muscles and joints, which can vary over time.  Sleep and fatigue problems. Other related symptoms may include:  Bowel and bladder problems.  Headaches.  Visual problems.  Problems with odors and noises.  Depression or mood changes.  Painful periods (dysmenorrhea).  Dryness of the skin or eyes. DIAGNOSIS  There are no specific tests for diagnosing fibromyalgia. Patients can be diagnosed accurately from the specific symptoms they have. The diagnosis is made by determining that nothing else is causing the problems. TREATMENT  There is no cure. Management includes medicines and an active, healthy lifestyle. The goal is to enhance physical fitness, decrease pain, and improve sleep. HOME CARE INSTRUCTIONS   Only take over-the-counter or prescription medicines as directed by your caregiver. Sleeping pills, tranquilizers, and pain medicines may make your problems worse.  Low-impact aerobic exercise is very important and advised for treatment. At first, it may seem to make pain worse. Gradually increasing your tolerance will overcome this feeling.  Learning relaxation techniques and how to control stress  will help you. Biofeedback, visual imagery, hypnosis, muscle relaxation, yoga, and meditation are all options.  Anti-inflammatory medicines and physical therapy may provide short-term help.  Acupuncture or massage treatments may help.  Take muscle relaxant medicines as suggested by your caregiver.  Avoid stressful situations.  Plan a healthy lifestyle. This includes your diet, sleep, rest, exercise, and friends.  Find and practice a hobby you enjoy.  Join a fibromyalgia support group for interaction, ideas, and sharing advice. This may be helpful. SEEK MEDICAL CARE IF:  You are not having good results or improvement from your treatment. FOR MORE INFORMATION  National Fibromyalgia Association: www.fmaware.org Arthritis Foundation: www.arthritis.org Document Released: 03/23/2005 Document Revised: 06/15/2011 Document Reviewed: 07/03/2009 [facility] Patient Information 2015 [place], [place]. This information is not intended to replace advice given to you by your health care provider. Make sure you discuss any questions you have with your health care provider.

## 2014-07-06 NOTE — Progress Notes (Signed)
Subjective:    Patient ID: Tonya Perry, female    DOB: January 23, 1976, 39 y.o.   MRN: [id number] Chief Complaint  Patient presents with  . Wants to try belviq  . swelling in right leg and ankle  . hands going numb    HPI   Has RA in her maternal aunt so would like to be tested for this today as her first MCPs bilaterally has been waking her from sleep.  Has been a little swollen but no redniess or warmth. Tend to be most painful in the mornings.  Has not changed shoes or had any injury that she knows of but her feet are hurting - initially upon awakening and after being on them all day.  Sxs not consistent with plantar fasciitis or claudication - more likely to be bone spur - can f/u w/ podiatry if she prefers.  Can't really describe pain in but feels like she is having muscular pain in both of her hands, forearms, knees, and radiating down to her feet.  She has been feeling tingling in her toes sev week ago but this has ceased and now is just lower extremity pain. Is still have chronic knee pain from her patella alta bilaterally which has been worsening and she is still having pain in bilateral wrists radiating up her foremars.  She has been wearing the bilateral carpal tunnel cock-up wrist splints with minimal relief, still has lots of pins and needles tingling in hands during day - she couldn't hold paint brush or write for long.  Has been ibuprofen occasionally with minimal relief, went to [facility] which didn't help at all.  Did have NCV/EMG in all of her extremities that was normal.    She is having intermittent chronic pain everywhere and did not feel like she got a good evaluation.  Has been taking her iron supplement with vit C, was trying to follow a low carb diet but has been having trouble following it.  She gets to be in a lot of pain and then eats comfort foods.  Taking a multivitamin with some vit D   Saw a dermatologist for dermatographism   Getting dr.  Vonita Mossmohid akatayo from neuropsychiatric clinic. [facility]  Past Medical History  Diagnosis Date  . Allergy   . Depression   . Anxiety   . Asthma   . Arthritis   . Substance abuse    Current Outpatient Prescriptions on File Prior to Visit  Medication Sig Dispense Refill  . acetaminophen (TYLENOL) 500 MG tablet Take 500 mg by mouth.    . Azelastine HCl 0.15 % SOLN Place 1 spray into the nose 2 (two) times daily. 30 mL 5  . cetirizine (ZYRTEC) 10 MG tablet Take 10 mg by mouth daily.    Marland Kitchen. Dextroamphetamine Sulfate 20 MG TABS Take 20 mg by mouth daily.     . diclofenac sodium (VOLTAREN) 1 % GEL Apply 2 g topically 4 (four) times daily. 100 g 3  . diphenhydrAMINE (BENADRYL) 25 mg capsule Take 25 mg by mouth Nightly. May do 50 mg    . EPIPEN 2-PAK 0.3 MG/0.3ML SOAJ injection   1  . fish oil-omega-3 fatty acids 1000 MG capsule Take 1 g by mouth daily.    Marland Kitchen. gabapentin (NEURONTIN) 300 MG capsule Take 400 mg by mouth 2 (two) times daily.     . Multiple Vitamins-Minerals (MULTIVITAMIN WITH MINERALS) tablet Take 1 tablet by mouth daily.    .Marland Kitchen  PROAIR HFA 108 (90 BASE) MCG/ACT inhaler   0  . sertraline (ZOLOFT) 100 MG tablet Take 200 mg by mouth daily.    Marland Kitchen Spacer/Aero-Holding Chambers [facility] [place] DIAMOND) MISC   1   No current facility-administered medications on file prior to visit.   Allergies  Allergen Reactions  . Azithromycin Diarrhea    diarrhea  . Hydrocodone-Acetaminophen Itching  . Other Itching and Other (See Comments)    Peas, Mango, Melons, Cantelope, Watermelons.  Dust mites, trees, grass and pollen.  Causes throat to swell with itching. Tree nuts, causes throat to swell with itching.  Marland Kitchen Pineapple Other (See Comments)    Causes throat to swell with itching.  . Shellfish Allergy   . Penicillins Rash    Review of Systems  Constitutional: Positive for diaphoresis, fatigue and unexpected weight change. Negative for fever, chills, activity change and appetite  change.  Cardiovascular: Positive for leg swelling. Negative for chest pain.  Gastrointestinal: Negative for nausea, vomiting, abdominal pain, diarrhea and constipation.  Musculoskeletal: Positive for myalgias, back pain, joint swelling, arthralgias, neck pain and neck stiffness. Negative for gait problem.  Skin: Positive for color change and rash. Negative for wound.  Neurological: Positive for weakness, numbness and headaches. Negative for dizziness, seizures and syncope.  Hematological: Negative for adenopathy. Bruises/bleeds easily.  Psychiatric/Behavioral: Positive for sleep disturbance, dysphoric mood, decreased concentration and agitation. Negative for hallucinations, behavioral problems and self-injury. The patient is nervous/anxious.        Objective:  BP 115/77 mmHg  Pulse 91  Temp(Src) 97.9 F (36.6 C) (Oral)  Resp 16  Ht  (1.6 m)  Wt 199 lb 3.2 oz (90.357 kg)  BMI 35.30 kg/m2  SpO2 98%  Physical Exam  Constitutional: She is oriented to person, place, and time. She appears well-developed and well-nourished. No distress.  HENT:  Head: Normocephalic and atraumatic.  Right Ear: External ear normal.  Left Ear: External ear normal.  Eyes: Conjunctivae are normal. No scleral icterus.  Neck: Normal range of motion. Neck supple. No thyromegaly present.  Cardiovascular: Normal rate, regular rhythm, normal heart sounds and intact distal pulses.   Pulmonary/Chest: Effort normal and breath sounds normal. No respiratory distress.  Musculoskeletal: She exhibits no edema.  Lymphadenopathy:    She has no cervical adenopathy.  Neurological: She is alert and oriented to person, place, and time.  Skin: Skin is warm and dry. She is not diaphoretic. No erythema.  Psychiatric: She has a normal mood and affect. Her behavior is normal.          Assessment & Plan:   Tinea pedis of both feet  Onychomycosis  Foot pain, bilateral - Plan: Uric acid - consider f/u w/ podiatry if  pain continues, cons xray - not c/w/ plantar fasciitis or claudication. Could be bone spur vs strain vs osteoarthritis.  Anemia, iron deficiency - Plan: CBC with Differential/Platelet, Ferritin  Chronic pain syndrome - Plan: Rheumatoid factor, CK, Cyclic Citrul Peptide Antibody, IGG - could have fibromyalgia - pt agrees but would like rheumatology eval to ensure not missing other etiology - try cyclobenzaprine qhs, increase exercise despite pain, pt is not a candidate for narcotic therapy due to psychiatric co-morbidities AND HISTORY OF SUBSTANCE ABUSE - high risk for addictive behavior.  If pain cont to worsen, pt would like to be referred to a pain clinic.  Could try Cymbalta but would have to wean off zoloft  qd.  Cont gabapentin  bid w/ prn voltaren get.  Consider referral to  Dr. Wynn Banker or Dr. Nickola Major for management of pain w/ minimal opoid use.  Polypharmacy - Plan: Comprehensive metabolic panel  Peripheral neuralgia - Plan: Sedimentation Rate, C-reactive protein - pt with diffuse sxs in all ext but w/ sxs c/w carpal tunnel syndrome as well though not improvement w/ o/n bracing - cons referral to hand surg though pt reports h/o normal NCV/EMG so could just be from fibromyalgia.  Edema of right lower extremity  Neck pain  Bilateral patella alta - pt c/o chronic pain and dysfunction on going despite nsaids, icing, and strengthening exercises.  Rec ortho referral - Dr. Raeanne Barry or Dr. Humphrey Rolls would be a good fit since pt is not a surgical candidate.  Meds ordered this encounter  Medications  . fluticasone (FLONASE) 50 MCG/ACT nasal spray    Sig: Place 2 sprays into both nostrils daily.    Dispense:  16 g    Refill:  11  . cyclobenzaprine (FLEXERIL) 10 MG tablet    Sig: Take 1 tablet (10 mg total) by mouth at bedtime.    Dispense:  30 tablet    Refill:  2     [name], MD MPH

## 2014-07-07 ENCOUNTER — Encounter: Payer: Self-pay | Admitting: Family Medicine

## 2014-07-07 ENCOUNTER — Telehealth: Payer: Self-pay

## 2014-07-07 LAB — COMPREHENSIVE METABOLIC PANEL
ALBUMIN: 3.9 g/dL (ref 3.5–5.2)
ALK PHOS: 69 U/L (ref 39–117)
ALT: 14 U/L (ref 0–35)
AST: 14 U/L (ref 0–37)
BUN: 15 mg/dL (ref 6–23)
CO2: 26 mEq/L (ref 19–32)
Calcium: 9.2 mg/dL (ref 8.4–10.5)
Chloride: 107 mEq/L (ref 96–112)
Creat: 0.7 mg/dL (ref 0.50–1.10)
GLUCOSE: 74 mg/dL (ref 70–99)
POTASSIUM: 4.1 meq/L (ref 3.5–5.3)
Sodium: 141 mEq/L (ref 135–145)
Total Bilirubin: 0.2 mg/dL (ref 0.2–1.2)
Total Protein: 6.6 g/dL (ref 6.0–8.3)

## 2014-07-07 LAB — SEDIMENTATION RATE: Sed Rate: 4 mm/hr (ref 0–20)

## 2014-07-07 LAB — CK: Total CK: 79 U/L (ref 7–177)

## 2014-07-07 LAB — FERRITIN: Ferritin: 27 ng/mL (ref 10–291)

## 2014-07-07 LAB — URIC ACID: Uric Acid, Serum: 4.3 mg/dL (ref 2.4–7.0)

## 2014-07-07 LAB — C-REACTIVE PROTEIN: CRP: <0.5 mg/dL (ref <0.60)

## 2014-07-07 LAB — RHEUMATOID FACTOR: Rhuematoid fact SerPl-aCnc: <10 IU/mL (ref <=14)

## 2014-07-07 NOTE — Telephone Encounter (Signed)
Patient says Dr Clelia CroftShaw was supposed to call in a muscle relaxer and did not do so. Please advise

## 2014-07-07 NOTE — Telephone Encounter (Signed)
Dr Clelia CroftShaw-- You said you were going to call muscle relaxer in for pt?

## 2014-07-09 ENCOUNTER — Telehealth: Payer: Self-pay

## 2014-07-09 LAB — CYCLIC CITRUL PEPTIDE ANTIBODY, IGG: Cyclic Citrullin Peptide Ab: <2 U/mL (ref 0.0–5.0)

## 2014-07-09 NOTE — Telephone Encounter (Signed)
Patient calling for muscle relaxer.  Please call. Left e-mail, called  [phone] (M)

## 2014-07-10 ENCOUNTER — Telehealth: Payer: Self-pay

## 2014-07-10 MED ORDER — CYCLOBENZAPRINE HCL 10 MG PO TABS: 10.0000 mg | ORAL_TABLET | Freq: Every day | ORAL | Status: DC

## 2014-07-10 NOTE — Telephone Encounter (Signed)
Sent in cyclobenzaprine 

## 2014-07-10 NOTE — Telephone Encounter (Signed)
Returning a call to Dr Clelia CroftShaw

## 2014-07-10 NOTE — Telephone Encounter (Signed)
Gave pt message that flexeril had been called in.

## 2014-07-10 NOTE — Telephone Encounter (Signed)
The patient came in to 104 to ask about referrals and muscle relaxer that should have been called in on Friday 07/06/14.  The patient states that Sunday night she was awakened every hour due to pain and achy/crampy feeling in arms and legs.  Ibuprofen has not helped the patient with this pain.  Patient is requesting muscle relaxer or other pain reliever for this pain.  The [facility] [address]  Please call the patient to discuss situation at [phone] (M). -

## 2014-07-10 NOTE — Telephone Encounter (Signed)
Called pt and LVM that I'm sorry I forgot. Sent in cyclobenzaprine to her pharmacy for her to start taking every night.  Let me know how it is working.

## 2014-07-11 ENCOUNTER — Encounter: Payer: Self-pay | Admitting: Family Medicine

## 2014-07-13 ENCOUNTER — Ambulatory Visit: Payer: Self-pay | Admitting: Family Medicine

## 2014-07-16 ENCOUNTER — Encounter: Payer: Self-pay | Admitting: Family Medicine

## 2014-07-16 ENCOUNTER — Telehealth: Payer: Self-pay

## 2014-07-16 NOTE — Telephone Encounter (Signed)
Pt calling in regards to her test results, she states that she has some questions, and would like to know about a diagnosis. States she also sent an email through [employer]

## 2014-07-16 NOTE — Telephone Encounter (Signed)
Dr. Clelia CroftShaw, see email. Thanks

## 2014-07-17 NOTE — Telephone Encounter (Signed)
Tonya Perry - Pt came by about labs because she has not heard back from anyone.  Her mom is a Engineer, civil (consulting)nurse and went over the labs with her to explain the findings.   She would like to know if this could be fibromyalgia.  Also says the medicine is not working and if she could please try something else.  Patient is very anxious about this and desperatly would like an answer [phone]

## 2014-07-17 NOTE — Telephone Encounter (Signed)
All labs are normal so it is very possible that pt has fibromyalgia.  The only long term treatment for fibromyalgia is exercise.  If pt wants to change meds, she needs to come in - is on zoloft, neurontin, and cyclobenzaprine all of which can be helpful in fibromyalgia - there are other medicines available to help temporarily reduce symptoms but she will need OV for this as will likely have to wean off of one/some of the above.

## 2014-07-18 DIAGNOSIS — F329 Major depressive disorder, single episode, unspecified: Secondary | ICD-10-CM | POA: Insufficient documentation

## 2014-07-18 DIAGNOSIS — F419 Anxiety disorder, unspecified: Secondary | ICD-10-CM | POA: Insufficient documentation

## 2014-07-18 DIAGNOSIS — Z7189 Other specified counseling: Secondary | ICD-10-CM | POA: Insufficient documentation

## 2014-07-18 DIAGNOSIS — M1990 Unspecified osteoarthritis, unspecified site: Secondary | ICD-10-CM | POA: Insufficient documentation

## 2014-07-18 DIAGNOSIS — F32A Depression, unspecified: Secondary | ICD-10-CM | POA: Insufficient documentation

## 2014-07-18 NOTE — Telephone Encounter (Signed)
Spoke with pt, advised pt to RTC.to speak with Dr. Clelia CroftShaw. Pt understood and will come in to see her tomorrow.

## 2014-07-19 ENCOUNTER — Ambulatory Visit (INDEPENDENT_AMBULATORY_CARE_PROVIDER_SITE_OTHER): Payer: BLUE CROSS/BLUE SHIELD | Admitting: Family Medicine

## 2014-07-19 VITALS — BP 124/76 | HR 93 | Temp 98.2°F | Resp 17 | Ht 63.5 in | Wt 202.0 lb

## 2014-07-19 DIAGNOSIS — Z79899 Other long term (current) drug therapy: Secondary | ICD-10-CM

## 2014-07-19 DIAGNOSIS — Q682 Congenital deformity of knee: Secondary | ICD-10-CM

## 2014-07-19 DIAGNOSIS — G2581 Restless legs syndrome: Secondary | ICD-10-CM | POA: Diagnosis not present

## 2014-07-19 DIAGNOSIS — F411 Generalized anxiety disorder: Secondary | ICD-10-CM | POA: Diagnosis not present

## 2014-07-19 DIAGNOSIS — M546 Pain in thoracic spine: Secondary | ICD-10-CM | POA: Diagnosis not present

## 2014-07-19 DIAGNOSIS — M797 Fibromyalgia: Secondary | ICD-10-CM | POA: Diagnosis not present

## 2014-07-19 DIAGNOSIS — F329 Major depressive disorder, single episode, unspecified: Secondary | ICD-10-CM | POA: Diagnosis not present

## 2014-07-19 DIAGNOSIS — M25532 Pain in left wrist: Secondary | ICD-10-CM

## 2014-07-19 DIAGNOSIS — F32A Depression, unspecified: Secondary | ICD-10-CM

## 2014-07-19 DIAGNOSIS — R5382 Chronic fatigue, unspecified: Secondary | ICD-10-CM

## 2014-07-19 MED ORDER — PREGABALIN 75 MG PO CAPS: 75.0000 mg | ORAL_CAPSULE | Freq: Two times a day (BID) | ORAL | Status: DC

## 2014-07-19 MED ORDER — AMITRIPTYLINE HCL 50 MG PO TABS: 50.0000 mg | ORAL_TABLET | Freq: Every day | ORAL | Status: DC

## 2014-07-19 NOTE — Progress Notes (Signed)
Subjective:    Patient ID: Tonya Perry, female    DOB: 1975/10/20, 39 y.o.   MRN: [id number] This chart was scribed for Norberto SorensonEva Shaw, MD by Littie Deedsichard Sun, Medical Scribe. This patient was seen in Room 14 and the patient's care was started at 11:52 AM.   Chief Complaint  Patient presents with  . Medication Problem    not working   . Abdominal Pain    HPI HPI Comments: Tonya PhenixLaurie W Perry is a 39 y.o. female with a hx of depression and anxiety who presents to the [facility] for a follow-up.  Saw her 2 weeks ago for musculoskeletal cramping. Had concern for fibromyalgia as extensive blood work was normal. She was put on cyclobenzaprine in the evenings for this.  Myalgias: Patient notes that the flexeril has not been working for her; it also makes her too drowsy. It has been helping her sleep, but has not been providing her relief for pain. She has been taking Neurontin 2 times a day. Patient still has been having intermittent cramping pain throughout her body, including her arms, legs and toes (1st MCPs bilaterally) - this has been getting worse. The pain also wakes her up from sleep. Her restless legs has also gotten worse, and sometimes gets symptoms during the day now. She has been having problems with hands going numb ("falling asleep") and difficulty holding things for long periods of time for the past 3 years. The pain has worsened her mental health. Patient has FMHx of rheumatoid arthritis (aunt) and fibromyalgia (half-sister). She denies SI, although she has had SI in the past; her daughter keeps her going. She saw her psychiatrist, Dr. Thedore MinsMojeed Akintayo, yesterday. She does not have many emotional support outlets. There is a support group that she has been wanting to go to on Wednesdays at 1:30 PM, but she has not been able to go yet. She has been seeing her therapist, Efraim KaufmannMelissa, at [facility] once a week for the past 2-3 months. She also plans to see Dr. Dub MikesLugo, who she has seen  in the past. Patient notes she does not get formal exercise; she has been unable to afford gym membership. 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 She then got another job afterwards, but had been laid off from that.  Past Medical History  Diagnosis Date  . Allergy   . Depression   . Anxiety   . Asthma   . Arthritis   . Substance abuse    Current Outpatient Prescriptions on File Prior to Visit  Medication Sig Dispense Refill  . acetaminophen (TYLENOL) 500 MG tablet Take 500 mg by mouth.    . Azelastine HCl 0.15 % SOLN Place 1 spray into the nose 2 (two) times daily. 30 mL 5  . cetirizine (ZYRTEC) 10 MG tablet Take 10 mg by mouth daily.    . cyclobenzaprine (FLEXERIL) 10 MG tablet Take 1 tablet (10 mg total) by mouth at bedtime. 30 tablet 2  . Dextroamphetamine Sulfate 20 MG TABS Take 20 mg by mouth daily.     . diclofenac sodium (VOLTAREN) 1 % GEL Apply 2 g topically 4 (four) times daily. 100 g 3  . diphenhydrAMINE (BENADRYL) 25 mg capsule Take 25 mg by mouth Nightly. May do 50 mg    . EPIPEN 2-PAK 0.3 MG/0.3ML SOAJ injection   1  . fish oil-omega-3 fatty acids 1000 MG capsule Take 1 g by mouth daily.    .Marland Kitchen  fluticasone (FLONASE) 50 MCG/ACT nasal spray Place 2 sprays into both nostrils daily. 16 g 11  . Multiple Vitamins-Minerals (MULTIVITAMIN WITH MINERALS) tablet Take 1 tablet by mouth daily.    Marland Kitchen PROAIR HFA 108 (90 BASE) MCG/ACT inhaler   0  . sertraline (ZOLOFT) 100 MG tablet Take 100 mg by mouth daily.    Marland Kitchen Spacer/Aero-Holding Chambers [facility] DIAMOND) MISC   1   No current facility-administered medications on file prior to visit.   Allergies  Allergen Reactions  . Azithromycin Diarrhea    diarrhea  . Hydrocodone-Acetaminophen Itching  . Other Itching and Other (See Comments)    Peas, Mango, Melons, Cantelope, Watermelons.  Dust mites, trees, grass and pollen.  Causes throat to swell with itching. Tree nuts, causes throat to  swell with itching.  Marland Kitchen Pineapple Other (See Comments)    Causes throat to swell with itching.  . Shellfish Allergy   . Penicillins Rash    Review of Systems  Constitutional: Positive for diaphoresis, activity change and fatigue. Negative for chills, appetite change and unexpected weight change.  Gastrointestinal: Positive for abdominal pain. Negative for vomiting.  Genitourinary: Negative for dysuria.  Musculoskeletal: Positive for myalgias, back pain, joint swelling, arthralgias, gait problem, neck pain and neck stiffness.  Skin: Negative for rash.  Neurological: Positive for tremors, weakness and headaches. Negative for dizziness and numbness.  Psychiatric/Behavioral: Positive for sleep disturbance, dysphoric mood and decreased concentration. Negative for suicidal ideas and self-injury. The patient is nervous/anxious. The patient is not hyperactive.        Objective:  BP 124/76 mmHg  Pulse 93  Temp(Src) 98.2 F (36.8 C) (Oral)  Resp 17  Ht 5' 3.5" (1.613 m)  Wt 202 lb (91.627 kg)  BMI 35.22 kg/m2  SpO2 98%  Physical Exam  Constitutional: She is oriented to person, place, and time. She appears well-developed and well-nourished. No distress.  HENT:  Head: Normocephalic and atraumatic.  Mouth/Throat: Oropharynx is clear and moist. No oropharyngeal exudate.  Eyes: Pupils are equal, round, and reactive to light.  Neck: Neck supple.  Cardiovascular: Normal rate.   Pulmonary/Chest: Effort normal.  Musculoskeletal: She exhibits no edema.  Neurological: She is alert and oriented to person, place, and time. No cranial nerve deficit.  Skin: Skin is warm and dry. No rash noted.  Psychiatric: She has a normal mood and affect. Her behavior is normal.  Vitals reviewed.         Assessment & Plan:   1. Fibromyalgia - unsure of diagnosis but labs all normal and diffuse pain w/o sig physical exam findings - pt requests second opinion so will refer to Dr. Corliss Skains for rheum c/s - the  fact that pt had no benefit from cyclobenzaprine is not consistent but certainly worsening pain due to worsening mood sxs could explain.  No response to gabapentin but making fatigue worse so try lyrica instead. Pt w/ many different pain etiologies which have been worsening and unresponsive to treatment so also try Pain clinic - pt high risk for opioid addiction due to mental health and mood d/o so try [facility] (Dr. Doroteo Bradford or colleague or Dr. Nickola Major.  2. Polypharmacy   3. Chronic fatigue   4. Generalized anxiety disorder   5. Depression - pt will need to try gradually weaning of zoloft so we can start TCA and try transition to cymbalta.  Discussed case w/ pt's therapist at [facility] and with pt's pyschiatrist today both of whom agree to medicaiton change. Pt instructed  to record goals with daily written update - try to do 3 things working towards her goal daily (such as called to get her daughter set up for testing for a EAP.)  6. Restless leg syndrome   7. Patella alta   8. Left-sided thoracic back pain - pain worsening - pt requesting ortho c/s so will refer to PMR Dr. Raeanne Barry - have [facility] PT try to focus on trapezius muscle spasms  9.     Left wrist pain - needs MRI as worsening w/ PT and prior abnml xray  Orders Placed This Encounter  Procedures  . Ambulatory referral to Pain Clinic    Referral Priority:  Routine    Referral Type:  Consultation    Referral Reason:  Specialty Services Required    Requested Specialty:  Pain Medicine    Number of Visits Requested:  1  . Ambulatory referral to Rheumatology    Referral Priority:  Routine    Referral Type:  Consultation    Referral Reason:  Specialty Services Required    Requested Specialty:  Rheumatology    Number of Visits Requested:  1  . Ambulatory referral to Orthopedic Surgery    Referral Priority:  Routine    Referral Type:  Surgical    Referral Reason:  Specialty Services Required    Requested  Specialty:  Orthopedic Surgery    Number of Visits Requested:  1    Meds ordered this encounter  Medications  . amitriptyline (ELAVIL) 50 MG tablet    Sig: Take 1 tablet (50 mg total) by mouth at bedtime. Increase to 2 tabs qhs after 2 weeks    Dispense:  60 tablet    Refill:  0  . pregabalin (LYRICA) 75 MG capsule    Sig: Take 1 capsule (75 mg total) by mouth 2 (two) times daily. And increase to 2 tabs tid after 1 week as tolerated    Dispense:  120 capsule    Refill:  0   Over 40 min spent in face-to-face evaluation of and consultation with patient and coordination of care.  Over 50% of this time was spent counseling this patient.  I personally performed the services described in this documentation, which was scribed in my presence. The recorded information has been reviewed and considered, and addended by me as needed.  [name], MD MPH

## 2014-07-19 NOTE — Patient Instructions (Signed)
Decrease your zoloft to 100 mg daily from 200mg .  Start lyrica 1 twice a day instead of the gabapentin twice a day.  In 1 week go up to 2 tabs twice a day if tolerated. Start amitriptyline 50mg  at night and increase to 2 tabs at night in 2 weeks as tolerated.

## 2014-07-30 ENCOUNTER — Encounter: Payer: Self-pay | Admitting: Family Medicine

## 2014-08-07 ENCOUNTER — Ambulatory Visit (INDEPENDENT_AMBULATORY_CARE_PROVIDER_SITE_OTHER): Payer: BLUE CROSS/BLUE SHIELD | Admitting: Family Medicine

## 2014-08-07 VITALS — BP 118/70 | HR 93 | Temp 98.1°F | Resp 16 | Ht 62.5 in | Wt 203.4 lb

## 2014-08-07 DIAGNOSIS — J029 Acute pharyngitis, unspecified: Secondary | ICD-10-CM

## 2014-08-07 DIAGNOSIS — G5692 Unspecified mononeuropathy of left upper limb: Secondary | ICD-10-CM

## 2014-08-07 DIAGNOSIS — M797 Fibromyalgia: Secondary | ICD-10-CM

## 2014-08-07 DIAGNOSIS — F39 Unspecified mood [affective] disorder: Secondary | ICD-10-CM

## 2014-08-07 DIAGNOSIS — R5382 Chronic fatigue, unspecified: Secondary | ICD-10-CM

## 2014-08-07 DIAGNOSIS — G894 Chronic pain syndrome: Secondary | ICD-10-CM | POA: Diagnosis not present

## 2014-08-07 LAB — POCT SEDIMENTATION RATE: POCT SED RATE: 9 mm/hr (ref 0–22)

## 2014-08-07 LAB — POCT CBC
Granulocyte percent: 70.9 %G (ref 37–80)
HEMATOCRIT: 38 % (ref 37.7–47.9)
HEMOGLOBIN: 12.3 g/dL (ref 12.2–16.2)
Lymph, poc: 2.3 (ref 0.6–3.4)
MCH, POC: 27.7 pg (ref 27–31.2)
MCHC: 32.3 g/dL (ref 31.8–35.4)
MCV: 85.7 fL (ref 80–97)
MID (cbc): 0.6 (ref 0–0.9)
MPV: 7.2 fL (ref 0–99.8)
POC GRANULOCYTE: 7 — AB (ref 2–6.9)
POC LYMPH %: 23.5 % (ref 10–50)
POC MID %: 5.6 %M (ref 0–12)
Platelet Count, POC: 368 10*3/uL (ref 142–424)
RBC: 4.44 M/uL (ref 4.04–5.48)
RDW, POC: 16.7 %
WBC: 9.9 10*3/uL (ref 4.6–10.2)

## 2014-08-07 LAB — POCT RAPID STREP A (OFFICE): Rapid Strep A Screen: NEGATIVE

## 2014-08-07 MED ORDER — METAXALONE 800 MG PO TABS: 800.0000 mg | ORAL_TABLET | Freq: Four times a day (QID) | ORAL | Status: DC

## 2014-08-07 MED ORDER — CETIRIZINE HCL 10 MG PO TABS: 10.0000 mg | ORAL_TABLET | Freq: Every day | ORAL | Status: DC

## 2014-08-07 MED ORDER — CEPHALEXIN 500 MG PO CAPS: 500.0000 mg | ORAL_CAPSULE | Freq: Three times a day (TID) | ORAL | Status: DC

## 2014-08-07 NOTE — Progress Notes (Addendum)
Subjective:    Patient ID: Tonya Perry, female    DOB: Aug 05, 1975, 39 y.o.   MRN: [id number] This chart was scribed for [name], MD by Littie Deeds, Medical Scribe. This patient was seen in Room 9 and the patient's care was started at 3:39 PM.   Chief Complaint  Patient presents with  . Sore Throat    ?bacterial infection--questions from last OV    HPI HPI Comments: Tonya Perry is a 39 y.o. female with a hx of depression and anxiety who presents to the [facility] for a follow-up.  I saw her 2 weeks ago. She has been having a lot of problems with worsening chronic diffuse pain and muscle cramping. All of her evaluations have returned normal, giving [place] concern for fibromyalgia. She also has been having some depression and social anxiety. She is seeing a psychiatrist, Dr. Thedore Mins and she has a therapist, Efraim Kaufmann, at [facility]. We referred patient to Dr. Corliss Skains for a second opinion on fibromyalgia, but Dr. Corliss Skains is not accepting new patients. She was referred to Dr. Nickola Major for pain management and Dr. Maurice Small. Appointent with Dr. Maurice Small was 07/31/14. She has also been having numbness and tingling of her hands, so left wrist MRI has been scheduled for May 12th. We're trying to wean patient off of Zoloft so we can transition her to Cymbalta and may want to add in TCA. Patient was encouraged to set daily goals and record these. At last visit, we also started patient on Lyrica. Tonya Perry reported that the Lyrica and amitriptyline did not work. She did see Dr. Dub Mikes, her prior psychiatrist 2 weeks ago and he has started her on Cymbalta and is weaning her off the Zoloft and increased her Neurontin. She got turned down from her first disabilities application and has contacted an attorney. Here today to start gathering her records.  On the Lyrica, patient was feeling depressed so she stopped. The Neurontin was prescribed by Dr. Dub Mikes about 2 weeks ago. She has  still been having numbness in her hands, described as hands "falling asleep." She did have nerve conduction studies done at [facility] and was found to be negative for carpal tunnel. She has not been using the Voltaren gel because it has not been helpful. Patient states she does have an appointment with Dr. Nickola Major in 2 weeks, on May 16th. She still has some weakness in her hands, occasionally dropping things. She is right hand dominant. She does report having some lower back pain radiating to her right buttock. Patient denies neck pain currently.  Patient also reports having URI symptoms that started 4 days ago. She thinks she may have a bacterial infection. She reports having sore throat, ear pain, jaw pain, postnasal drip, and dry cough. Her daughter was the first to have symptoms. She has tried Advil, ibuprofen and hot tea but without relief to her symptoms. She has not tried salt water gargles.   Past Medical History  Diagnosis Date  . Allergy   . Depression   . Anxiety   . Asthma   . Arthritis   . Substance abuse    Current Outpatient Prescriptions on File Prior to Visit  Medication Sig Dispense Refill  . acetaminophen (TYLENOL) 500 MG tablet Take 500 mg by mouth.    . Azelastine HCl 0.15 % SOLN Place 1 spray into the nose 2 (two) times daily. 30 mL 5  . cyclobenzaprine (FLEXERIL) 10 MG tablet Take 1 tablet (  10 mg total) by mouth at bedtime. 30 tablet 2  . Dextroamphetamine Sulfate 20 MG TABS Take 20 mg by mouth daily.     . diclofenac sodium (VOLTAREN) 1 % GEL Apply 2 g topically 4 (four) times daily. 100 g 3  . diphenhydrAMINE (BENADRYL) 25 mg capsule Take 25 mg by mouth Nightly. May do 50 mg    . EPIPEN 2-PAK 0.3 MG/0.3ML SOAJ injection   1  . fish oil-omega-3 fatty acids 1000 MG capsule Take 1 g by mouth daily.    . fluticasone (FLONASE) 50 MCG/ACT nasal spray Place 2 sprays into both nostrils daily. 16 g 11  . Multiple Vitamins-Minerals (MULTIVITAMIN WITH MINERALS)  tablet Take 1 tablet by mouth daily.    Marland Kitchen PROAIR HFA 108 (90 BASE) MCG/ACT inhaler   0  . sertraline (ZOLOFT) 100 MG tablet Take 100 mg by mouth daily.    Marland Kitchen Spacer/Aero-Holding Chambers [facility] DIAMOND) MISC   1   No current facility-administered medications on file prior to visit.   Allergies  Allergen Reactions  . Azithromycin Diarrhea    diarrhea  . Hydrocodone-Acetaminophen Itching  . Other Itching and Other (See Comments)    Peas, Mango, Melons, Cantelope, Watermelons.  Dust mites, trees, grass and pollen.  Causes throat to swell with itching. Tree nuts, causes throat to swell with itching.  Marland Kitchen Pineapple Other (See Comments)    Causes throat to swell with itching.  . Shellfish Allergy   . Penicillins Rash     Review of Systems  Constitutional: Positive for fever, activity change and fatigue. Negative for diaphoresis and appetite change.  HENT: Positive for ear pain, postnasal drip and sore throat.   Respiratory: Positive for cough.   Endocrine: Negative for polyuria.  Musculoskeletal: Positive for myalgias, back pain, joint swelling, arthralgias and neck stiffness. Negative for gait problem and neck pain.  Allergic/Immunologic: Negative for immunocompromised state.  Neurological: Positive for weakness and numbness.  Psychiatric/Behavioral: Positive for dysphoric mood. Negative for suicidal ideas and self-injury. The patient is nervous/anxious. The patient is not hyperactive.        Objective:  BP 118/70 mmHg  Pulse 93  Temp(Src) 98.1 F (36.7 C) (Oral)  Resp 16  Ht 5' 2.5" (1.588 m)  Wt 203 lb 6 oz (92.25 kg)  BMI 36.58 kg/m2  SpO2 98%  Physical Exam  Constitutional: She is oriented to person, place, and time. She appears well-developed and well-nourished. No distress.  HENT:  Head: Normocephalic and atraumatic.  Right Ear: Tympanic membrane normal.  Left Ear: Tympanic membrane is erythematous and retracted.  Mouth/Throat: Oropharynx is clear and moist. No  oropharyngeal exudate, posterior oropharyngeal edema or posterior oropharyngeal erythema.  Dry nasal mucosa.  Eyes: Pupils are equal, round, and reactive to light.  Neck: Normal range of motion. Neck supple.  Anterior cervical adenopathy. Normal cervical ROM, but has some pain over mid c-spine with extension.  Cardiovascular: Normal rate, regular rhythm, S1 normal, S2 normal and normal heart sounds.   Pulses:      Radial pulses are 2+ on the right side, and 2+ on the left side.  2+ radial and ulnar pulses bilaterally.  Pulmonary/Chest: Effort normal and breath sounds normal. No respiratory distress. She has no wheezes. She has no rales.  Musculoskeletal: She exhibits no edema.  No focal tenderness over thoracic or lumbar spine.  Significant thoracic kyphosis. No nodal scoliosis. No significant scoliosis. Normal flexion. Moderate reduction in extension. Normal lateral rotation and flexion.  Thenar imminence appears  normal. Positive Phalen's on the left, negative reverse Phalen's. Positive Tinel's.   Lymphadenopathy:       Head (right side): No submandibular, no preauricular and no posterior auricular adenopathy present.       Head (left side): No submandibular, no preauricular and no posterior auricular adenopathy present.    She has cervical adenopathy.       Right: No supraclavicular adenopathy present.       Left: No supraclavicular adenopathy present.  Neurological: She is alert and oriented to person, place, and time. No cranial nerve deficit.  Reflex Scores:      Tricep reflexes are 2+ on the right side and 2+ on the left side.      Bicep reflexes are 2+ on the right side and 2+ on the left side.      Brachioradialis reflexes are 2+ on the right side and 2+ on the left side. 4/5 strength on opposition of thumb, but 5/5 on pincher grasp bilaterally.   Skin: Skin is warm and dry. No rash noted.  Psychiatric: She has a normal mood and affect. Her behavior is normal.  Vitals reviewed.      Assessment & Plan:  Acute pharyngitis, unspecified pharyngitis type - Plan: POCT rapid strep A, Culture, Group A Strep, POCT CBC, POCT SEDIMENTATION RATE, Ferritin - daughter on omnicef - pt advised to wait 1-2d as suspect her illness is viral and should start resolving soon - if not, then fill antibiotic  Fibromyalgia - new diagnosis - all sxs seem c/w FM except that pt did not get any sig improvement on TCA or cyclobenzaprine.  Cont to push exercise which pt is reluctant to do. Cont cyclobenzaprine qhs.  Neuropathy of hand, left - Has appt w/ Dr. Nickola Majoralton-Bethea - restart wearing cock-up wrist brace bilaterally o/n - saw Dr. Verneda SkillAtthar for NCV recently w/o abnormality - no CTS - but sxs worsening with weakness - MRI Left wrist P.  Cont neurontin 400 tid (side effects to lyrica) - may need to increase qhs dose from 400->800 if pain persisting.  Chronic fatigue  Chronic pain syndrome - starting cymbalta.  No controlled meds/opiates for this pt due to h/o substance abuse and mood d/o.  Has not tried any other muscle relaxant then flexeril which pt resports does not help - just makes her tired - try skelaxin during day  Mood disorder - was able to re-establish w/ Dr. Dub MikesLugo - weaning off of zoloft and onto cymbalta, [facility]  Patella alto - has appt w/ Dr. Raeanne BarryIbazibo soon   Orders Placed This Encounter  Procedures  . Culture, Group A Strep  . Ferritin  . POCT rapid strep A  . POCT CBC  . POCT SEDIMENTATION RATE    Meds ordered this encounter  Medications  . gabapentin (NEURONTIN) 400 MG capsule    Sig: Take 400 mg by mouth 3 (three) times daily.  . DULoxetine (CYMBALTA) 30 MG capsule    Sig: Take 30 mg by mouth 2 (two) times daily.  . cetirizine (ZYRTEC) 10 MG tablet    Sig: Take 1 tablet (10 mg total) by mouth at bedtime.    Dispense:  90 tablet    Refill:  3  . metaxalone (SKELAXIN) 800 MG tablet    Sig: Take 1 tablet (800 mg total) by mouth 4 (four) times  daily.    Dispense:  60 tablet    Refill:  1  . cephALEXin (KEFLEX) 500 MG capsule  Sig: Take 1 capsule (500 mg total) by mouth 3 (three) times daily.    Dispense:  21 capsule    Refill:  0   Over 40 min spent in face-to-face evaluation of and consultation with patient and coordination of care.  Over 50% of this time was spent counseling this patient.  I personally performed the services described in this documentation, which was scribed in my presence. The recorded information has been reviewed and considered, and addended by me as needed.  [name], MD MPH

## 2014-08-08 LAB — FERRITIN: Ferritin: 28 ng/mL (ref 10–291)

## 2014-08-09 LAB — CULTURE, GROUP A STREP: ORGANISM ID, BACTERIA: NORMAL

## 2014-08-16 ENCOUNTER — Inpatient Hospital Stay: Admission: RE | Admit: 2014-08-16 | Payer: PRIVATE HEALTH INSURANCE | Source: Ambulatory Visit

## 2014-08-21 ENCOUNTER — Telehealth: Payer: Self-pay

## 2014-08-21 MED ORDER — TIZANIDINE HCL 4 MG PO CAPS: 4.0000 mg | ORAL_CAPSULE | Freq: Three times a day (TID) | ORAL | Status: DC | PRN

## 2014-08-21 NOTE — Telephone Encounter (Signed)
Forwarded to referrals to see if they can help pt change her imaging location  Sent pt MyChart message to clarify on all other requests: Don't know what foot fungus med she wants - looks like she was rx'ed ketoconazole prior but I noted nail fungus as well so may need oral med Also is on highest dose of skelaxin and has failed flexeril so will try on other - pt has not tried zanaflex, robaxin, or baclofen.

## 2014-08-21 NOTE — Telephone Encounter (Signed)
Please advise on Rx refills.

## 2014-08-21 NOTE — Telephone Encounter (Signed)
The patient came in to [address] building to leave message for Dr. Clelia CroftShaw.  The [facility] because she has a Social research officer, governmentsecondary insurance (scanned in today) that will cover the MRI of her wrist at 100%??    The patient is also requesting refill of Skelaxin and the foot fungus medication.  The patient is requesting an increase in the dosing for the Skelaxin.  The patient may be reached at [phone].

## 2014-08-22 DIAGNOSIS — J4520 Mild intermittent asthma, uncomplicated: Secondary | ICD-10-CM | POA: Insufficient documentation

## 2014-08-22 DIAGNOSIS — Z91013 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: Secondary | ICD-10-CM | POA: Insufficient documentation

## 2014-08-22 DIAGNOSIS — R21 Rash and other nonspecific skin eruption: Secondary | ICD-10-CM | POA: Insufficient documentation

## 2014-08-22 DIAGNOSIS — H1045 Other chronic allergic conjunctivitis: Secondary | ICD-10-CM | POA: Insufficient documentation

## 2014-08-27 ENCOUNTER — Encounter: Payer: Self-pay | Admitting: *Deleted

## 2014-08-27 DIAGNOSIS — H1045 Other chronic allergic conjunctivitis: Secondary | ICD-10-CM

## 2014-08-27 DIAGNOSIS — Z91013 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: Secondary | ICD-10-CM

## 2014-08-27 DIAGNOSIS — R21 Rash and other nonspecific skin eruption: Secondary | ICD-10-CM

## 2014-08-27 DIAGNOSIS — J309 Allergic rhinitis, unspecified: Secondary | ICD-10-CM

## 2014-08-27 DIAGNOSIS — J4520 Mild intermittent asthma, uncomplicated: Secondary | ICD-10-CM

## 2014-09-01 MED ORDER — TERBINAFINE HCL 250 MG PO TABS: 250.0000 mg | ORAL_TABLET | Freq: Every day | ORAL | Status: DC

## 2014-09-01 NOTE — Addendum Note (Signed)
Addended by: Norberto SorensonSHAW, EVA on: 09/01/2014 04:01 PM   Modules accepted: Orders

## 2014-09-07 ENCOUNTER — Ambulatory Visit (INDEPENDENT_AMBULATORY_CARE_PROVIDER_SITE_OTHER): Payer: BLUE CROSS/BLUE SHIELD | Admitting: Family Medicine

## 2014-09-07 ENCOUNTER — Encounter: Payer: Self-pay | Admitting: Family Medicine

## 2014-09-07 VITALS — BP 110/82 | HR 86 | Temp 97.9°F | Resp 16 | Ht 62.75 in | Wt 201.8 lb

## 2014-09-07 DIAGNOSIS — G2581 Restless legs syndrome: Secondary | ICD-10-CM

## 2014-09-07 DIAGNOSIS — G609 Hereditary and idiopathic neuropathy, unspecified: Secondary | ICD-10-CM | POA: Diagnosis not present

## 2014-09-07 DIAGNOSIS — M7541 Impingement syndrome of right shoulder: Secondary | ICD-10-CM | POA: Diagnosis not present

## 2014-09-07 DIAGNOSIS — G44229 Chronic tension-type headache, not intractable: Secondary | ICD-10-CM | POA: Diagnosis not present

## 2014-09-07 DIAGNOSIS — M797 Fibromyalgia: Secondary | ICD-10-CM | POA: Diagnosis not present

## 2014-09-07 MED ORDER — KETOROLAC TROMETHAMINE 60 MG/2ML IM SOLN
60.0000 mg | Freq: Once | INTRAMUSCULAR | Status: AC
Start: 2014-09-07 — End: 2014-09-07
  Administered 2014-09-07: 60 mg via INTRAMUSCULAR

## 2014-09-07 MED ORDER — GABAPENTIN 400 MG PO CAPS: 400.0000 mg | ORAL_CAPSULE | Freq: Four times a day (QID) | ORAL | Status: DC

## 2014-09-07 NOTE — Progress Notes (Addendum)
Subjective:    Patient ID: Tonya Perry, female    DOB: Aug 23, 1975, 39 y.o.   MRN: [id number] This chart was scribed for [name], MD by Littie Deeds, Medical Scribe. This patient was seen in Room 25 and the patient's care was started at 12:38 PM.   Chief Complaint  Patient presents with  . Body aches    x 3 mos  . Depression    based on depression screen, score 18    HPI HPI Comments: Tonya Perry is a 39 y.o. female with a hx of depression who presents to the [facility] for a follow-up.  She has had a complicated medical history, complaining of increasing amounts of pain over the past year. In addition, this has been complicated by bilateral upper extremity neuropathy, left worse than right, for the past 18 months and bilateral lower extremity neuropathy for the past year, which she describes as making her legs crawl. At our last visit, patient had failed treatment of presumed fibromyalgia on flexeril and elavil, so she was started on skelaxin. She emailed and said this was not strong enough, so I transitioned her to Zanaflex. She is also on gabapentin 400 mg TID which provides mild relief and failed Lyrica. At last visit, she was noted to have toenail fungus and athlete's foot. Failed topical antifungal, so we started her on Lamisil last week. She has seen Dr. Frances Furbish, neurology, with negative workup. She requested rheumatology referral and Dr. Corliss Skains had seen patient in past, but declined referral, so was referred to Dr. Zenovia Jordan, but has not yet been scheduled. She is seeing Dr. Dub Mikes again recently for psychiatry. He is weaning her up on Cymbalta. She saw Dr. Maurice Small at [facility] in April for her patella alta. We referred her to pain management with Dr. Nickola Major, but we do not know if that has been scheduled. She only saw Dr. Frances Furbish one time and then after neuropathy labs and NVC/EMG were normal, patient canceled follow-up visit.  Myalgias: She  has been frustrated overall. Patient has not started on the Zanaflex yet because she wants to finish the skelaxin first. The MRI for her wrist has not been scheduled yet as she is still waiting on a call. She saw Dr. Maurice Small yesterday and was told that he could not do anything to help her. She has not been called back by Dr. Nickola Major' office yet; she would still like to see a rheumatologist. She has also seen Dr. Nickola Major, who did not offer her any treatments. Dr. Dub Mikes increased her Cymbalta to 90 mg qd recently.   Right Shoulder Pain: Patient has had pain in her right shoulder when laying on the right side. This does not bother her except for when laying on that side, and she does not have any problems with her left shoulder.  Toenail Fungus/Athlete's Foot: Patient did start the Lamisil without any problems, including heartburn, diarrhea, and nausea. The itchiness has decreased.  Fatigue: She has been having some fatigue. Patient has been taking iron.   Her daughter has selective mutism and has not talked to any of her classmates this past year. She is at [employer]. She lives with her "narcissistic" husband because she cannot afford to live elsewhere.  Past Medical History  Diagnosis Date  . Allergy   . Depression   . Anxiety   . Asthma   . Arthritis   . Substance abuse    Past Surgical History  Procedure  Laterality Date  . Eye surgery Bilateral 2006  . Laparoscopic appendectomy N/A 11/04/2013    Procedure: APPENDECTOMY LAPAROSCOPIC;  Surgeon: Valarie Merino, MD;  Location: [facility];  Service: General;  Laterality: N/A;  . Appendectomy  8/15   Current Outpatient Prescriptions on File Prior to Visit  Medication Sig Dispense Refill  . acetaminophen (TYLENOL) 500 MG tablet Take 500 mg by mouth.    . cetirizine (ZYRTEC) 10 MG tablet Take 1 tablet (10 mg total) by mouth at bedtime. (Patient taking differently: Take 10 mg by mouth daily as needed. ) 90 tablet 3  .  dextroamphetamine (DEXTROSTAT) 10 MG tablet     . Dextroamphetamine Sulfate 20 MG TABS Take 20 mg by mouth daily.     . diclofenac sodium (VOLTAREN) 1 % GEL Apply 2 g topically 4 (four) times daily. 100 g 3  . diphenhydrAMINE (BENADRYL) 25 mg capsule Take 25 mg by mouth at bedtime as needed (1 -2 tablets as needed at bedtime). May do 50 mg    . EPIPEN 2-PAK 0.3 MG/0.3ML SOAJ injection Inject 0.3 mg as directed as needed (systemic reaction to seafood allergy).   1  . fexofenadine-pseudoephedrine (ALLEGRA-D 24) 180-240 MG per 24 hr tablet Take 1 tablet by mouth daily as needed (180 mg tablet, 1 tablet as needed once a day).    . fish oil-omega-3 fatty acids 1000 MG capsule Take 1 g by mouth daily.    . fluticasone (FLONASE) 50 MCG/ACT nasal spray Place 2 sprays into both nostrils daily. 16 g 11  . Multiple Vitamins-Minerals (MULTIVITAMIN WITH MINERALS) tablet Take 1 tablet by mouth daily.    Marland Kitchen PROAIR HFA 108 (90 BASE) MCG/ACT inhaler Inhale 2 puffs into the lungs as needed for wheezing (2 puffs as needed every 4-6h as needed for cough/wheeze).   0  . Spacer/Aero-Holding Chambers [facility] DIAMOND) MISC   1   No current facility-administered medications on file prior to visit.   Allergies  Allergen Reactions  . Azithromycin Diarrhea    diarrhea  . Hydrocodone-Acetaminophen Itching  . Other Itching and Other (See Comments)    Peas, Mango, Melons, Cantelope, Watermelons.  Dust mites, trees, grass and pollen.  Causes throat to swell with itching. Tree nuts, causes throat to swell with itching.  Marland Kitchen Pineapple Other (See Comments)    Causes throat to swell with itching.  . Shellfish Allergy   . Penicillins Rash   Family History  Problem Relation Age of Onset  . Depression Mother   . Diabetes Mother   . Mental illness Mother   . COPD Father   . Mental illness Father   . Hypertension Father   . Hyperlipidemia Father   . Depression Maternal Grandmother   . Mental illness Maternal  Grandmother   . Diabetes Maternal Grandfather   . Mental illness Maternal Grandfather   . Cancer Paternal Grandfather   . Mental illness Paternal Grandfather   . Mental illness Sister   . Mental illness Sister    History   Social History  . Marital Status: Married    Spouse Name: Elliston  . Number of Children: 1  . Years of Education: Assoc   Occupational History  .      not employed   Social History Main Topics  . Smoking status: Former Smoker    Quit date: 04/06/1996  . Smokeless tobacco: Never Used  . Alcohol Use: 0.0 oz/week    0 Standard drinks or equivalent per week  Comment: occas.  . Drug Use: No  . Sexual Activity: Yes   Other Topics Concern  . None   Social History Narrative   Patient consumes 4-5 cups of caffeine daily.    Review of Systems  Constitutional: Positive for activity change, appetite change and fatigue. Negative for fever, chills and unexpected weight change.  Cardiovascular: Negative for chest pain and palpitations.  Gastrointestinal: Negative for nausea, vomiting, abdominal pain and diarrhea.  Musculoskeletal: Positive for myalgias, back pain, arthralgias, neck pain and neck stiffness. Negative for joint swelling and gait problem.  Skin: Positive for rash.  Allergic/Immunologic: Positive for environmental allergies. Negative for immunocompromised state.  Neurological: Positive for weakness, light-headedness, numbness and headaches. Negative for dizziness and syncope.  Hematological: Positive for adenopathy. Bruises/bleeds easily.  Psychiatric/Behavioral: Positive for behavioral problems, sleep disturbance, dysphoric mood and decreased concentration. Negative for suicidal ideas and self-injury. The patient is nervous/anxious. The patient is not hyperactive.        Objective:  BP 110/82 mmHg  Pulse 86  Temp(Src) 97.9 F (36.6 C) (Oral)  Resp 16  Ht 5' 2.75" (1.594 m)  Wt 201 lb 12.8 oz (91.536 kg)  BMI 36.03 kg/m2  SpO2 99%  Physical  Exam  Constitutional: She is oriented to person, place, and time. She appears well-developed and well-nourished. No distress.  HENT:  Head: Normocephalic and atraumatic.  Mouth/Throat: Oropharynx is clear and moist. No oropharyngeal exudate.  Eyes: Pupils are equal, round, and reactive to light.  Neck: Neck supple. No thyromegaly present.  Thyroid normal.  Cardiovascular: Normal rate, regular rhythm, S1 normal, S2 normal and normal heart sounds.   No murmur heard. Pulmonary/Chest: Effort normal and breath sounds normal. No respiratory distress. She has no wheezes. She has no rales.  CTAB.  Musculoskeletal: She exhibits no edema.  Positive Neer's and positive Hawkins. AC joint, corticoid process and spine of scapula are normal. Full ROM in both shoulders.   Neurological: She is alert and oriented to person, place, and time. No cranial nerve deficit.  Skin: Skin is warm and dry. No rash noted.  Psychiatric: She has a normal mood and affect. Her behavior is normal.  Vitals reviewed.         Assessment & Plan:   1. Fibromyalgia   2. Shoulder impingement syndrome, right   3. Idiopathic peripheral neuropathy   4. Restless leg syndrome   5. Chronic tension-type headache, not intractable     Meds ordered this encounter  Medications  . ketorolac (TORADOL) injection 60 mg    Sig:   .  gabapentin (NEURONTIN) 400 MG capsule    Sig: Take 1 capsule (400 mg total) by mouth 4 (four) times daily.    Dispense:  120 capsule    Refill:  1   Over 40 min spent in face-to-face evaluation of and consultation with patient and coordination of care.  Over 50% of this time was spent counseling this patient.  I personally performed the services described in this documentation, which was scribed in my presence. The recorded information has been reviewed and considered, and addended by me as needed.  Norberto SorensonEva Shaw, MD MPH

## 2014-09-07 NOTE — Patient Instructions (Addendum)
Ice your right shoulder and try these exercises.  If it continues, we can put a steroid injection in there and/or refer your to physical therapy (which is where you are going to get the most benefit and hopefully complete relief of this).  Impingement Syndrome, Rotator Cuff, Bursitis with Rehab Impingement syndrome is a condition that involves inflammation of the tendons of the rotator cuff and the subacromial bursa, that causes pain in the shoulder. The rotator cuff consists of four tendons and muscles that control much of the shoulder and upper arm function. The subacromial bursa is a fluid filled sac that helps reduce friction between the rotator cuff and one of the bones of the shoulder (acromion). Impingement syndrome is usually an overuse injury that causes swelling of the bursa (bursitis), swelling of the tendon (tendonitis), and/or a tear of the tendon (strain). Strains are classified into three categories. Grade 1 strains cause pain, but the tendon is not lengthened. Grade 2 strains include a lengthened ligament, due to the ligament being stretched or partially ruptured. With grade 2 strains there is still function, although the function may be decreased. Grade 3 strains include a complete tear of the tendon or muscle, and function is usually impaired. SYMPTOMS   Pain around the shoulder, often at the outer portion of the upper arm.  Pain that gets worse with shoulder function, especially when reaching overhead or lifting.  Sometimes, aching when not using the arm.  Pain that wakes you up at night.  Sometimes, tenderness, swelling, warmth, or redness over the affected area.  Loss of strength.  Limited motion of the shoulder, especially reaching behind the back (to the back pocket or to unhook bra) or across your body.  Crackling sound (crepitation) when moving the arm.  Biceps tendon pain and inflammation (in the front of the shoulder). Worse when bending the elbow or lifting. CAUSES   Impingement syndrome is often an overuse injury, in which chronic (repetitive) motions cause the tendons or bursa to become inflamed. A strain occurs when a force is paced on the tendon or muscle that is greater than it can withstand. Common mechanisms of injury include: Stress from sudden increase in duration, frequency, or intensity of training.  Direct hit (trauma) to the shoulder.  Aging, erosion of the tendon with normal use.  Bony bump on shoulder (acromial spur). RISK INCREASES WITH:  Contact sports (football, wrestling, boxing).  Throwing sports (baseball, tennis, volleyball).  Weightlifting and bodybuilding.  Heavy labor.  Previous injury to the rotator cuff, including impingement.  Poor shoulder strength and flexibility.  Failure to warm up properly before activity.  Inadequate protective equipment.  Old age.  Bony bump on shoulder (acromial spur). PREVENTION   Warm up and stretch properly before activity.  Allow for adequate recovery between workouts.  Maintain physical fitness:  Strength, flexibility, and endurance.  Cardiovascular fitness.  Learn and use proper exercise technique. PROGNOSIS  If treated properly, impingement syndrome usually goes away within 6 weeks. Sometimes surgery is required.  RELATED COMPLICATIONS   Longer healing time if not properly treated, or if not given enough time to heal.  Recurring symptoms, that result in a chronic condition.  Shoulder stiffness, frozen shoulder, or loss of motion.  Rotator cuff tendon tear.  Recurring symptoms, especially if activity is resumed too soon, with overuse, with a direct blow, or when using poor technique. TREATMENT  Treatment first involves the use of ice and medicine, to reduce pain and inflammation. The use of strengthening and  stretching exercises may help reduce pain with activity. These exercises may be performed at home or with a therapist. If non-surgical treatment is  unsuccessful after more than 6 months, surgery may be advised. After surgery and rehabilitation, activity is usually possible in 3 months.  MEDICATION  If pain medicine is needed, nonsteroidal anti-inflammatory medicines (aspirin and ibuprofen), or other minor pain relievers (acetaminophen), are often advised.  Do not take pain medicine for 7 days before surgery.  Prescription pain relievers may be given, if your caregiver thinks they are needed. Use only as directed and only as much as you need.  Corticosteroid injections may be given by your caregiver. These injections should be reserved for the most serious cases, because they may only be given a certain number of times. HEAT AND COLD  Cold treatment (icing) should be applied for 10 to 15 minutes every 2 to 3 hours for inflammation and pain, and immediately after activity that aggravates your symptoms. Use ice packs or an ice massage.  Heat treatment may be used before performing stretching and strengthening activities prescribed by your caregiver, physical therapist, or athletic trainer. Use a heat pack or a warm water soak. SEEK MEDICAL CARE IF:   Symptoms get worse or do not improve in 4 to 6 weeks, despite treatment.  New, unexplained symptoms develop. (Drugs used in treatment may produce side effects.) EXERCISES  RANGE OF MOTION (ROM) AND STRETCHING EXERCISES - Impingement Syndrome (Rotator Cuff  Tendinitis, Bursitis) These exercises may help you when beginning to rehabilitate your injury. Your symptoms may go away with or without further involvement from your physician, physical therapist or athletic trainer. While completing these exercises, remember:   Restoring tissue flexibility helps normal motion to return to the joints. This allows healthier, less painful movement and activity.  An effective stretch should be held for at least 30 seconds.  A stretch should never be painful. You should only feel a gentle lengthening or  release in the stretched tissue. STRETCH - Flexion, Standing  Stand with good posture. With an underhand grip on your right / left hand, and an overhand grip on the opposite hand, grasp a broomstick or cane so that your hands are a little more than shoulder width apart.  Keeping your right / left elbow straight and shoulder muscles relaxed, push the stick with your opposite hand, to raise your right / left arm in front of your body and then overhead. Raise your arm until you feel a stretch in your right / left shoulder, but before you have increased shoulder pain.  Try to avoid shrugging your right / left shoulder as your arm rises, by keeping your shoulder blade tucked down and toward your mid-back spine. Hold for __________ seconds.  Slowly return to the starting position. Repeat __________ times. Complete this exercise __________ times per day. STRETCH - Abduction, Supine  Lie on your back. With an underhand grip on your right / left hand and an overhand grip on the opposite hand, grasp a broomstick or cane so that your hands are a little more than shoulder width apart.  Keeping your right / left elbow straight and your shoulder muscles relaxed, push the stick with your opposite hand, to raise your right / left arm out to the side of your body and then overhead. Raise your arm until you feel a stretch in your right / left shoulder, but before you have increased shoulder pain.  Try to avoid shrugging your right / left shoulder as  your arm rises, by keeping your shoulder blade tucked down and toward your mid-back spine. Hold for __________ seconds.  Slowly return to the starting position. Repeat __________ times. Complete this exercise __________ times per day. ROM - Flexion, Active-Assisted  Lie on your back. You may bend your knees for comfort.  Grasp a broomstick or cane so your hands are about shoulder width apart. Your right / left hand should grip the end of the stick, so that your  hand is positioned "thumbs-up," as if you were about to shake hands.  Using your healthy arm to lead, raise your right / left arm overhead, until you feel a gentle stretch in your shoulder. Hold for __________ seconds.  Use the stick to assist in returning your right / left arm to its starting position. Repeat __________ times. Complete this exercise __________ times per day.  ROM - Internal Rotation, Supine   Lie on your back on a firm surface. Place your right / left elbow about 60 degrees away from your side. Elevate your elbow with a folded towel, so that the elbow and shoulder are the same height.  Using a broomstick or cane and your strong arm, pull your right / left hand toward your body until you feel a gentle stretch, but no increase in your shoulder pain. Keep your shoulder and elbow in place throughout the exercise.  Hold for __________ seconds. Slowly return to the starting position. Repeat __________ times. Complete this exercise __________ times per day. STRETCH - Internal Rotation  Place your right / left hand behind your back, palm up.  Throw a towel or belt over your opposite shoulder. Grasp the towel with your right / left hand.  While keeping an upright posture, gently pull up on the towel, until you feel a stretch in the front of your right / left shoulder.  Avoid shrugging your right / left shoulder as your arm rises, by keeping your shoulder blade tucked down and toward your mid-back spine.  Hold for __________ seconds. Release the stretch, by lowering your healthy hand. Repeat __________ times. Complete this exercise __________ times per day. ROM - Internal Rotation   Using an underhand grip, grasp a stick behind your back with both hands.  While standing upright with good posture, slide the stick up your back until you feel a mild stretch in the front of your shoulder.  Hold for __________ seconds. Slowly return to your starting position. Repeat __________  times. Complete this exercise __________ times per day.  STRETCH - Posterior Shoulder Capsule   Stand or sit with good posture. Grasp your right / left elbow and draw it across your chest, keeping it at the same height as your shoulder.  Pull your elbow, so your upper arm comes in closer to your chest. Pull until you feel a gentle stretch in the back of your shoulder.  Hold for __________ seconds. Repeat __________ times. Complete this exercise __________ times per day. STRENGTHENING EXERCISES - Impingement Syndrome (Rotator Cuff Tendinitis, Bursitis) These exercises may help you when beginning to rehabilitate your injury. They may resolve your symptoms with or without further involvement from your physician, physical therapist or athletic trainer. While completing these exercises, remember:  Muscles can gain both the endurance and the strength needed for everyday activities through controlled exercises.  Complete these exercises as instructed by your physician, physical therapist or athletic trainer. Increase the resistance and repetitions only as guided.  You may experience muscle soreness or fatigue, but the  pain or discomfort you are trying to eliminate should never worsen during these exercises. If this pain does get worse, stop and make sure you are following the directions exactly. If the pain is still present after adjustments, discontinue the exercise until you can discuss the trouble with your clinician.  During your recovery, avoid activity or exercises which involve actions that place your injured hand or elbow above your head or behind your back or head. These positions stress the tissues which you are trying to heal. STRENGTH - Scapular Depression and Adduction   With good posture, sit on a firm chair. Support your arms in front of you, with pillows, arm rests, or on a table top. Have your elbows in line with the sides of your body.  Gently draw your shoulder blades down and  toward your mid-back spine. Gradually increase the tension, without tensing the muscles along the top of your shoulders and the back of your neck.  Hold for __________ seconds. Slowly release the tension and relax your muscles completely before starting the next repetition.  After you have practiced this exercise, remove the arm support and complete the exercise in standing as well as sitting position. Repeat __________ times. Complete this exercise __________ times per day.  STRENGTH - Shoulder Abductors, Isometric  With good posture, stand or sit about 4-6 inches from a wall, with your right / left side facing the wall.  Bend your right / left elbow. Gently press your right / left elbow into the wall. Increase the pressure gradually, until you are pressing as hard as you can, without shrugging your shoulder or increasing any shoulder discomfort.  Hold for __________ seconds.  Release the tension slowly. Relax your shoulder muscles completely before you begin the next repetition. Repeat __________ times. Complete this exercise __________ times per day.  STRENGTH - External Rotators, Isometric  Keep your right / left elbow at your side and bend it 90 degrees.  Step into a door frame so that the outside of your right / left wrist can press against the door frame without your upper arm leaving your side.  Gently press your right / left wrist into the door frame, as if you were trying to swing the back of your hand away from your stomach. Gradually increase the tension, until you are pressing as hard as you can, without shrugging your shoulder or increasing any shoulder discomfort.  Hold for __________ seconds.  Release the tension slowly. Relax your shoulder muscles completely before you begin the next repetition. Repeat __________ times. Complete this exercise __________ times per day.  STRENGTH - Supraspinatus   Stand or sit with good posture. Grasp a __________ weight, or an exercise  band or tubing, so that your hand is "thumbs-up," like you are shaking hands.  Slowly lift your right / left arm in a "V" away from your thigh, diagonally into the space between your side and straight ahead. Lift your hand to shoulder height or as far as you can, without increasing any shoulder pain. At first, many people do not lift their hands above shoulder height.  Avoid shrugging your right / left shoulder as your arm rises, by keeping your shoulder blade tucked down and toward your mid-back spine.  Hold for __________ seconds. Control the descent of your hand, as you slowly return to your starting position. Repeat __________ times. Complete this exercise __________ times per day.  STRENGTH - External Rotators  Secure a rubber exercise band or tubing to a  fixed object (table, pole) so that it is at the same height as your right / left elbow when you are standing or sitting on a firm surface.  Stand or sit so that the secured exercise band is at your uninjured side.  Bend your right / left elbow 90 degrees. Place a folded towel or small pillow under your right / left arm, so that your elbow is a few inches away from your side.  Keeping the tension on the exercise band, pull it away from your body, as if pivoting on your elbow. Be sure to keep your body steady, so that the movement is coming only from your rotating shoulder.  Hold for __________ seconds. Release the tension in a controlled manner, as you return to the starting position. Repeat __________ times. Complete this exercise __________ times per day.  STRENGTH - Internal Rotators   Secure a rubber exercise band or tubing to a fixed object (table, pole) so that it is at the same height as your right / left elbow when you are standing or sitting on a firm surface.  Stand or sit so that the secured exercise band is at your right / left side.  Bend your elbow 90 degrees. Place a folded towel or small pillow under your right / left  arm so that your elbow is a few inches away from your side.  Keeping the tension on the exercise band, pull it across your body, toward your stomach. Be sure to keep your body steady, so that the movement is coming only from your rotating shoulder.  Hold for __________ seconds. Release the tension in a controlled manner, as you return to the starting position. Repeat __________ times. Complete this exercise __________ times per day.  STRENGTH - Scapular Protractors, Standing   Stand arms length away from a wall. Place your hands on the wall, keeping your elbows straight.  Begin by dropping your shoulder blades down and toward your mid-back spine.  To strengthen your protractors, keep your shoulder blades down, but slide them forward on your rib cage. It will feel as if you are lifting the back of your rib cage away from the wall. This is a subtle motion and can be challenging to complete. Ask your caregiver for further instruction, if you are not sure you are doing the exercise correctly.  Hold for __________ seconds. Slowly return to the starting position, resting the muscles completely before starting the next repetition. Repeat __________ times. Complete this exercise __________ times per day. STRENGTH - Scapular Protractors, Supine  Lie on your back on a firm surface. Extend your right / left arm straight into the air while holding a __________ weight in your hand.  Keeping your head and back in place, lift your shoulder off the floor.  Hold for __________ seconds. Slowly return to the starting position, and allow your muscles to relax completely before starting the next repetition. Repeat __________ times. Complete this exercise __________ times per day. STRENGTH - Scapular Protractors, Quadruped  Get onto your hands and knees, with your shoulders directly over your hands (or as close as you can be, comfortably).  Keeping your elbows locked, lift the back of your rib cage up into  your shoulder blades, so your mid-back rounds out. Keep your neck muscles relaxed.  Hold this position for __________ seconds. Slowly return to the starting position and allow your muscles to relax completely before starting the next repetition. Repeat __________ times. Complete this exercise __________ times per  day.  STRENGTH - Scapular Retractors  Secure a rubber exercise band or tubing to a fixed object (table, pole), so that it is at the height of your shoulders when you are either standing, or sitting on a firm armless chair.  With a palm down grip, grasp an end of the band in each hand. Straighten your elbows and lift your hands straight in front of you, at shoulder height. Step back, away from the secured end of the band, until it becomes tense.  Squeezing your shoulder blades together, draw your elbows back toward your sides, as you bend them. Keep your upper arms lifted away from your body throughout the exercise.  Hold for __________ seconds. Slowly ease the tension on the band, as you reverse the directions and return to the starting position. Repeat __________ times. Complete this exercise __________ times per day. STRENGTH - Shoulder Extensors   Secure a rubber exercise band or tubing to a fixed object (table, pole) so that it is at the height of your shoulders when you are either standing, or sitting on a firm armless chair.  With a thumbs-up grip, grasp an end of the band in each hand. Straighten your elbows and lift your hands straight in front of you, at shoulder height. Step back, away from the secured end of the band, until it becomes tense.  Squeezing your shoulder blades together, pull your hands down to the sides of your thighs. Do not allow your hands to go behind you.  Hold for __________ seconds. Slowly ease the tension on the band, as you reverse the directions and return to the starting position. Repeat __________ times. Complete this exercise __________ times per  day.  STRENGTH - Scapular Retractors and External Rotators   Secure a rubber exercise band or tubing to a fixed object (table, pole) so that it is at the height as your shoulders, when you are either standing, or sitting on a firm armless chair.  With a palm down grip, grasp an end of the band in each hand. Bend your elbows 90 degrees and lift your elbows to shoulder height, at your sides. Step back, away from the secured end of the band, until it becomes tense.  Squeezing your shoulder blades together, rotate your shoulders so that your upper arms and elbows remain stationary, but your fists travel upward to head height.  Hold for __________ seconds. Slowly ease the tension on the band, as you reverse the directions and return to the starting position. Repeat __________ times. Complete this exercise __________ times per day.  STRENGTH - Scapular Retractors and External Rotators, Rowing   Secure a rubber exercise band or tubing to a fixed object (table, pole) so that it is at the height of your shoulders, when you are either standing, or sitting on a firm armless chair.  With a palm down grip, grasp an end of the band in each hand. Straighten your elbows and lift your hands straight in front of you, at shoulder height. Step back, away from the secured end of the band, until it becomes tense.  Step 1: Squeeze your shoulder blades together. Bending your elbows, draw your hands to your chest, as if you are rowing a boat. At the end of this motion, your hands and elbow should be at shoulder height and your elbows should be out to your sides.  Step 2: Rotate your shoulders, to raise your hands above your head. Your forearms should be vertical and your upper arms should  be horizontal.  Hold for __________ seconds. Slowly ease the tension on the band, as you reverse the directions and return to the starting position. Repeat __________ times. Complete this exercise __________ times per day.  STRENGTH  - Scapular Depressors  Find a sturdy chair without wheels, such as a dining room chair.  Keeping your feet on the floor, and your hands on the chair arms, lift your bottom up from the seat, and lock your elbows.  Keeping your elbows straight, allow gravity to pull your body weight down. Your shoulders will rise toward your ears.  Raise your body against gravity by drawing your shoulder blades down your back, shortening the distance between your shoulders and ears. Although your feet should always maintain contact with the floor, your feet should progressively support less body weight, as you get stronger.  Hold for __________ seconds. In a controlled and slow manner, lower your body weight to begin the next repetition. Repeat __________ times. Complete this exercise __________ times per day.  Document Released: 03/23/2005 Document Revised: 06/15/2011 Document Reviewed: 07/05/2008 [facility] Patient Information 2015 [place], [place]. This information is not intended to replace advice given to you by your health care provider. Make sure you discuss any questions you have with your health care provider.

## 2014-09-10 ENCOUNTER — Encounter: Payer: Self-pay | Admitting: Family Medicine

## 2014-09-11 ENCOUNTER — Other Ambulatory Visit: Payer: Self-pay

## 2014-09-11 NOTE — Telephone Encounter (Signed)
Patient is calling because she sent Dr. Clelia CroftShaw a mychart message and wants to let us know. She needs a refill for the muscle relaxer [facility] [address]

## 2014-09-17 ENCOUNTER — Telehealth: Payer: Self-pay

## 2014-09-17 DIAGNOSIS — Z0271 Encounter for disability determination: Secondary | ICD-10-CM

## 2014-09-17 NOTE — Telephone Encounter (Signed)
Skelaxin working better than xenoflex.  Patient needs refill of skelaxin.  Patient states that she has called several times regarding this refill.  The patient uses [facility] on [address].  The patient may be reached at [phone].

## 2014-09-17 NOTE — Telephone Encounter (Signed)
Dr Clelia Croft, pt called and [facility] that the Skelaxin is more effective than zanaflex and she would like RFs of it. It looks like when you wrote it in May, you put 1 RF on it, but at QID dosing, the #60 would only last 2 weeks. I have pended it with #120, but wasn't sure if you meant to only give pt #60 for 1 mos, not wanting pt to take 4 x day regularly? Please read the rest of the pt's message.

## 2014-09-18 ENCOUNTER — Other Ambulatory Visit: Payer: Self-pay | Admitting: Family Medicine

## 2014-09-18 MED ORDER — METAXALONE 800 MG PO TABS: 800.0000 mg | ORAL_TABLET | Freq: Four times a day (QID) | ORAL | Status: DC

## 2014-09-18 NOTE — Telephone Encounter (Signed)
lmom that we sent skelaxin to pharmacy.

## 2014-09-18 NOTE — Telephone Encounter (Signed)
Skelaxin sent to pharmacy.

## 2014-09-29 ENCOUNTER — Telehealth: Payer: Self-pay

## 2014-09-29 NOTE — Telephone Encounter (Signed)
Patient is requesting 90 supply refills (due to insurance requirements) for the following prescriptions: gabapentin (NEURONTIN) 400 MG capsule, DULoxetine (CYMBALTA) 30 MG capsule, and metaxalone (SKELAXIN) 800 MG tablet.  She also wants to know if she needs a refill of terbinafine (LAMISIL) 250 MG tablet.  She didn't know how long she was supposed to take that particular medication.  Please advise.  Thank you.  CB#: [phone]

## 2014-10-02 ENCOUNTER — Other Ambulatory Visit: Payer: Self-pay | Admitting: Family Medicine

## 2014-10-02 MED ORDER — GABAPENTIN 400 MG PO CAPS: 400.0000 mg | ORAL_CAPSULE | Freq: Four times a day (QID) | ORAL | Status: DC

## 2014-10-02 MED ORDER — METAXALONE 800 MG PO TABS: 800.0000 mg | ORAL_TABLET | Freq: Three times a day (TID) | ORAL | Status: DC

## 2014-10-02 MED ORDER — DULOXETINE HCL 30 MG PO CPEP: 90.0000 mg | ORAL_CAPSULE | Freq: Every day | ORAL | Status: DC

## 2014-10-02 MED ORDER — TERBINAFINE HCL 250 MG PO TABS: 250.0000 mg | ORAL_TABLET | Freq: Every day | ORAL | Status: DC

## 2014-10-02 NOTE — Telephone Encounter (Signed)
Also, decreased skelaxin to tid rather than qid because if she is using scheduled long-term it will interact with her neurontin and could lead to accidental od on those high doses. She should stay on the lamisil until her toenail fungus is completley gone but come in soon to get her labs.

## 2014-10-02 NOTE — Telephone Encounter (Signed)
Dr Clelia CroftShaw, you hadn't put that many RFs on these meds, so wasn't sure if you wanted to OK a 90 day supply of them? I don't see that you have Rx'd cymbalta for pt before (prev Rxd by Dr Geoffery LyonsIrving Lugo), but pharm is now requesting it from you. I pended the Lamisil also, but looks like you maybe only wanted pt to take this for 2 mos?

## 2014-10-02 NOTE — Telephone Encounter (Signed)
Was sent in to pt's pharmacy - no refills as I will definitely want to see her before the 90d fill is out to make sure her meds are working and recheck on her kidneys and liver through her blood to ensure the new meds are not causing harm.  She had told me that Dr. Dub MikesLugo had increased her cymbalta to 90 mg qd so that was the rx I sent.

## 2014-10-03 NOTE — Telephone Encounter (Signed)
Left VM on machine to call back regarding rx's.

## 2014-10-04 NOTE — Telephone Encounter (Signed)
Pt was called back and given messages that were left from Dr. Clelia CroftShaw.  Pt understood and stated she has an appt with Dr. Clelia CroftShaw in August.

## 2014-10-13 ENCOUNTER — Encounter: Payer: Self-pay | Admitting: Family Medicine

## 2014-10-19 ENCOUNTER — Telehealth: Payer: Self-pay

## 2014-10-19 ENCOUNTER — Encounter: Payer: Self-pay | Admitting: Family Medicine

## 2014-10-19 DIAGNOSIS — M797 Fibromyalgia: Secondary | ICD-10-CM

## 2014-10-19 DIAGNOSIS — L503 Dermatographic urticaria: Secondary | ICD-10-CM

## 2014-10-19 DIAGNOSIS — M722 Plantar fascial fibromatosis: Secondary | ICD-10-CM

## 2014-10-19 NOTE — Telephone Encounter (Signed)
Pt called states she was going to be referral to a rheumatologist and wants to check status of referral

## 2014-10-27 DIAGNOSIS — M797 Fibromyalgia: Secondary | ICD-10-CM | POA: Insufficient documentation

## 2014-10-27 DIAGNOSIS — L503 Dermatographic urticaria: Secondary | ICD-10-CM | POA: Insufficient documentation

## 2014-10-30 ENCOUNTER — Other Ambulatory Visit: Payer: Self-pay | Admitting: Family Medicine

## 2014-11-01 MED ORDER — TERBINAFINE HCL 250 MG PO TABS: 250.0000 mg | ORAL_TABLET | Freq: Every day | ORAL | Status: DC

## 2014-11-01 NOTE — Addendum Note (Signed)
Addended by: [name] on: 11/01/2014 12:03 PM   Modules accepted: Orders

## 2014-11-09 ENCOUNTER — Ambulatory Visit (INDEPENDENT_AMBULATORY_CARE_PROVIDER_SITE_OTHER): Payer: BLUE CROSS/BLUE SHIELD | Admitting: Family Medicine

## 2014-11-09 ENCOUNTER — Encounter: Payer: Self-pay | Admitting: Family Medicine

## 2014-11-09 VITALS — BP 130/85 | HR 108 | Temp 97.3°F | Resp 18 | Ht 63.5 in | Wt 209.0 lb

## 2014-11-09 DIAGNOSIS — F329 Major depressive disorder, single episode, unspecified: Secondary | ICD-10-CM | POA: Diagnosis not present

## 2014-11-09 DIAGNOSIS — M25432 Effusion, left wrist: Secondary | ICD-10-CM | POA: Diagnosis not present

## 2014-11-09 DIAGNOSIS — F419 Anxiety disorder, unspecified: Secondary | ICD-10-CM

## 2014-11-09 DIAGNOSIS — M797 Fibromyalgia: Secondary | ICD-10-CM | POA: Diagnosis not present

## 2014-11-09 DIAGNOSIS — M79672 Pain in left foot: Secondary | ICD-10-CM | POA: Diagnosis not present

## 2014-11-09 DIAGNOSIS — F32A Depression, unspecified: Secondary | ICD-10-CM

## 2014-11-09 NOTE — Patient Instructions (Signed)
Fibromyalgia Fibromyalgia is a disorder that is often misunderstood. It is associated with muscular pains and tenderness that comes and goes. It is often associated with fatigue and sleep disturbances. Though it tends to be long-lasting, fibromyalgia is not life-threatening. CAUSES  The exact cause of fibromyalgia is unknown. People with certain gene types are predisposed to developing fibromyalgia and other conditions. Certain factors can play a role as triggers, such as:  Spine disorders.  Arthritis.  Severe injury (trauma) and other physical stressors.  Emotional stressors. SYMPTOMS   The main symptom is pain and stiffness in the muscles and joints, which can vary over time.  Sleep and fatigue problems. Other related symptoms may include:  Bowel and bladder problems.  Headaches.  Visual problems.  Problems with odors and noises.  Depression or mood changes.  Painful periods (dysmenorrhea).  Dryness of the skin or eyes. DIAGNOSIS  There are no specific tests for diagnosing fibromyalgia. Patients can be diagnosed accurately from the specific symptoms they have. The diagnosis is made by determining that nothing else is causing the problems. TREATMENT  There is no cure. Management includes medicines and an active, healthy lifestyle. The goal is to enhance physical fitness, decrease pain, and improve sleep. HOME CARE INSTRUCTIONS   Only take over-the-counter or prescription medicines as directed by your caregiver. Sleeping pills, tranquilizers, and pain medicines may make your problems worse.  Low-impact aerobic exercise is very important and advised for treatment. At first, it may seem to make pain worse. Gradually increasing your tolerance will overcome this feeling.  Learning relaxation techniques and how to control stress will help you. Biofeedback, visual imagery, hypnosis, muscle relaxation, yoga, and meditation are all options.  Anti-inflammatory medicines and  physical therapy may provide short-term help.  Acupuncture or massage treatments may help.  Take muscle relaxant medicines as suggested by your caregiver.  Avoid stressful situations.  Plan a healthy lifestyle. This includes your diet, sleep, rest, exercise, and friends.  Find and practice a hobby you enjoy.  Join a fibromyalgia support group for interaction, ideas, and sharing advice. This may be helpful. SEEK MEDICAL CARE IF:  You are not having good results or improvement from your treatment. FOR MORE INFORMATION  National Fibromyalgia Association: www.fmaware.org Arthritis Foundation: www.arthritis.org Document Released: 03/23/2005 Document Revised: 06/15/2011 Document Reviewed: 07/03/2009 ExitCare Patient Information 2015 [employer]. This information is not intended to replace advice given to you by your health care provider. Make sure you discuss any questions you have with your health care provider.  
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
## 2014-11-14 ENCOUNTER — Ambulatory Visit (INDEPENDENT_AMBULATORY_CARE_PROVIDER_SITE_OTHER): Payer: BLUE CROSS/BLUE SHIELD | Admitting: Podiatry

## 2014-11-14 ENCOUNTER — Encounter: Payer: Self-pay | Admitting: Podiatry

## 2014-11-14 ENCOUNTER — Ambulatory Visit (INDEPENDENT_AMBULATORY_CARE_PROVIDER_SITE_OTHER): Payer: BLUE CROSS/BLUE SHIELD

## 2014-11-14 VITALS — BP 125/90 | HR 92 | Resp 16

## 2014-11-14 DIAGNOSIS — Z0189 Encounter for other specified special examinations: Secondary | ICD-10-CM

## 2014-11-14 DIAGNOSIS — M722 Plantar fascial fibromatosis: Secondary | ICD-10-CM | POA: Diagnosis not present

## 2014-11-14 MED ORDER — TRIAMCINOLONE ACETONIDE 10 MG/ML IJ SUSP
10.0000 mg | Freq: Once | INTRAMUSCULAR | Status: AC
Start: 2014-11-14 — End: 2014-11-14
  Administered 2014-11-14: 10 mg

## 2014-11-14 MED ORDER — DICLOFENAC SODIUM 75 MG PO TBEC: 75.0000 mg | DELAYED_RELEASE_TABLET | Freq: Two times a day (BID) | ORAL | Status: DC

## 2014-11-14 NOTE — Patient Instructions (Signed)

## 2014-11-14 NOTE — Progress Notes (Signed)
   Subjective:    Patient ID: Tonya Perry, female    DOB: [birth year], 39 y.o.   MRN: [id number]  HPI  Pt presents with plantar fascial pain on her left heel lasting 1 month, worse in the mornings  Review of Systems  HENT: Positive for sneezing and sore throat.   Musculoskeletal: Positive for myalgias and back pain.  All other systems reviewed and are negative.      Objective:   Physical Exam        Assessment & Plan:

## 2014-11-14 NOTE — Progress Notes (Signed)
Subjective:     Patient ID: Tonya Perry, female   DOB: 1975-05-01, 39 y.o.   MRN: [id number]  HPI patient states I have a painful left heel for several months worse over the last month and it's worse when I get up in the morning and after periods of sitting   Review of Systems  All other systems reviewed and are negative.      Objective:   Physical Exam  Constitutional: She is oriented to person, place, and time.  Cardiovascular: Intact distal pulses.   Musculoskeletal: Normal range of motion.  Neurological: She is oriented to person, place, and time.  Skin: Skin is warm.  Nursing note and vitals reviewed.  neurovascular status intact muscle strength was adequate with range of motion within normal limits. Patient is noted to have good digital perfusion is well oriented 3 with no equinus condition noted and is found to have exquisite discomfort left plantar fashion at the insertional point of the tendon into the calcaneus     Assessment:     Plantar fasciitis left with inflammation and fluid around the medial band    Plan:     H&P and x-rays reviewed and today I injected the plantar fascia 3 mg Kenalog 5 mg Xylocaine and applied fascial brace. Gave instructions on physical therapy and reappoint to recheck

## 2014-11-20 ENCOUNTER — Telehealth: Payer: Self-pay | Admitting: *Deleted

## 2014-11-20 NOTE — Telephone Encounter (Addendum)
Pt states the Voltaren is bothering her stomach.  Left message instructing pt to stop Voltaren to allow it to get out of her system, to take Tylenol for the pain if she was able to tolerate Tylenol and I would call 11/21/2014 with further instruction.  I also encouraged pt to add ice 10-15 mins 3-4 times a day for comfort and to decrease inflammation, being careful to have fabric between the ice and the skin.  Dr. Charlsie Merles states he really doesn't want to put her on anything else that might bother her stomach, he preferred she continue with rest, Ice and elevation and report for appt on 12/20/2014.  Pt agreed.

## 2014-11-22 ENCOUNTER — Encounter: Payer: Self-pay | Admitting: Podiatry

## 2014-11-22 ENCOUNTER — Ambulatory Visit (INDEPENDENT_AMBULATORY_CARE_PROVIDER_SITE_OTHER): Payer: BLUE CROSS/BLUE SHIELD | Admitting: Podiatry

## 2014-11-22 VITALS — BP 116/64 | HR 83 | Resp 16

## 2014-11-22 DIAGNOSIS — M722 Plantar fascial fibromatosis: Secondary | ICD-10-CM

## 2014-11-22 MED ORDER — TRIAMCINOLONE ACETONIDE 10 MG/ML IJ SUSP
10.0000 mg | Freq: Once | INTRAMUSCULAR | Status: AC
  Administered 2014-11-22: 10 mg

## 2014-11-22 NOTE — Progress Notes (Signed)
Subjective:     Patient ID: Tonya Perry, female   DOB: [birth year], 39 y.o.   MRN: [id number]  HPI patient states my heel is still hurting me but it's more on the outside now than the inside and makes it hard to bear good weight. It's bad when I get up in the morning or after periods of sitting   Review of Systems     Objective:   Physical Exam Neurovascular status intact muscle strength adequate with pain noted to be present on the lateral plantar and central plantar surface of the calcaneus with fluid buildup noted and moderate depression of the arch also noted    Assessment:     Lateral and central band plantar fasciitis left with continued discomfort especially after periods of sitting or in the morning    Plan:     Injected the lateral and central band of the plantar fascia 3 mg Kenalog 5 mg Xylocaine and dispensed night splint with all instructions on usage along with ice packs. Reappoint to recheck again in 4 weeks

## 2014-12-20 ENCOUNTER — Ambulatory Visit (INDEPENDENT_AMBULATORY_CARE_PROVIDER_SITE_OTHER): Payer: BLUE CROSS/BLUE SHIELD | Admitting: Podiatry

## 2014-12-20 ENCOUNTER — Encounter: Payer: Self-pay | Admitting: Podiatry

## 2014-12-20 ENCOUNTER — Telehealth: Payer: Self-pay | Admitting: *Deleted

## 2014-12-20 VITALS — BP 127/69 | HR 94 | Resp 16

## 2014-12-20 DIAGNOSIS — M722 Plantar fascial fibromatosis: Secondary | ICD-10-CM

## 2014-12-20 MED ORDER — MELOXICAM 15 MG PO TABS: 15.0000 mg | ORAL_TABLET | Freq: Every day | ORAL | Status: DC

## 2014-12-20 NOTE — Telephone Encounter (Addendum)
Pt states she was seen today, but Dr. Charlsie Merles did not call her medication in to the [facility] on [address].  Dr. Charlsie Merles ordered Mobic  #30 1 tablet daily.  Pt is informed.

## 2014-12-20 NOTE — Progress Notes (Signed)
Subjective:     Patient ID: Tonya Perry, female   DOB: [birth year], 39 y.o.   MRN: [id number]  HPI patient states the bottom of my left heel is still really bothering me and it's more irritated when I been ambulatory and when I try to work.   Review of Systems     Objective:   Physical Exam Neurovascular status intact muscle strength adequate range of motion within normal limits with patient noted to have discomfort in the plantar aspect left heel both medial lateral side and also central and more proximal within the heel pad itself. Depression of the arch is also noted    Assessment:     Plantar fasciitis that so far is not responding to conservative care with moderate depression of the arch as complicating factor    Plan:     Reviewed condition and discussed different treatment options. At this point I have recommended a cam walker to reduce all stress on the plantar foot and allowed to rest. I then scanned for orthotics due to her flattening of the arch and patient will be seen back when those are ready in 3 weeks or earlier if any issues should occur

## 2014-12-28 ENCOUNTER — Encounter: Payer: Self-pay | Admitting: Family Medicine

## 2014-12-28 DIAGNOSIS — M5137 Other intervertebral disc degeneration, lumbosacral region: Secondary | ICD-10-CM | POA: Insufficient documentation

## 2015-01-06 ENCOUNTER — Other Ambulatory Visit: Payer: Self-pay | Admitting: Family Medicine

## 2015-01-10 ENCOUNTER — Ambulatory Visit (INDEPENDENT_AMBULATORY_CARE_PROVIDER_SITE_OTHER): Payer: BLUE CROSS/BLUE SHIELD | Admitting: Podiatry

## 2015-01-10 ENCOUNTER — Encounter: Payer: Self-pay | Admitting: Podiatry

## 2015-01-10 VITALS — BP 115/84 | HR 106 | Resp 16

## 2015-01-10 DIAGNOSIS — M722 Plantar fascial fibromatosis: Secondary | ICD-10-CM | POA: Diagnosis not present

## 2015-01-10 NOTE — Progress Notes (Signed)
Subjective:     Patient ID: Tonya Perry, female   DOB: 11/11/1975, 39 y.o.   MRN: [id number]  HPI patient states I'm still having pain but it seems to be gradually improving with pain more in the arch now   Review of Systems     Objective:   Physical Exam  Neurovascular status intact muscle strength adequate with discomfort in the mid arch area left that appears to be present with palpation with the heel doing moderately better with discomfort still present upon deep palpation    Assessment:     Improving fasciitis left with arch-type pain    Plan:     H&P condition reviewed and went ahead today and scanned for the orthotics. Patient will be seen back to recheck

## 2015-01-10 NOTE — Patient Instructions (Signed)

## 2015-01-14 ENCOUNTER — Encounter: Payer: Self-pay | Admitting: Family Medicine

## 2015-01-14 ENCOUNTER — Other Ambulatory Visit: Payer: Self-pay | Admitting: Family Medicine

## 2015-01-16 ENCOUNTER — Other Ambulatory Visit: Payer: Self-pay | Admitting: Podiatry

## 2015-01-16 NOTE — Telephone Encounter (Signed)
Pt was instructed to make a follow up appt at last visit, pt must have an appt prior to future refills.

## 2015-01-17 ENCOUNTER — Telehealth: Payer: Self-pay

## 2015-01-17 NOTE — Telephone Encounter (Signed)
[facility] called regarding a MRI scheduled for tomorrow. They are not sure if it's supposed to be an MRI or a CT scan. [facility] says they received a fax regarding a CT. There is no CT referral in pt's chart and no mention of a CT. Not sure where the fax came from. I spoke to Dr. Clelia CroftShaw regarding situation. MRI was briefly brought up by patient during last visit with Dr. Clelia CroftShaw. Pt was to discuss having an MRI with Dr. Clelia CroftShaw at her next visit.   I called and spoke to patient regarding the situation. Pt says she is scheduled for an MRI of her wrist tomorrow and that [facility] was also waiting for information for MRI of her brain to be faxed. We have no referral for MRI of the brain. I told pt she will have to discuss MRI of the brain at her next appt with Dr. Clelia CroftShaw on 10/20.  Pt says she did not receive any referral from her orthopaedist.   [facility] to [facility] at [facility]. [phone] Had to leave VM.

## 2015-01-24 ENCOUNTER — Encounter: Payer: Self-pay | Admitting: Family Medicine

## 2015-01-24 ENCOUNTER — Ambulatory Visit (INDEPENDENT_AMBULATORY_CARE_PROVIDER_SITE_OTHER): Payer: BLUE CROSS/BLUE SHIELD | Admitting: Family Medicine

## 2015-01-24 VITALS — BP 126/79 | HR 107 | Temp 97.8°F | Resp 16 | Wt 205.8 lb

## 2015-01-24 DIAGNOSIS — J069 Acute upper respiratory infection, unspecified: Secondary | ICD-10-CM | POA: Diagnosis not present

## 2015-01-24 DIAGNOSIS — J019 Acute sinusitis, unspecified: Secondary | ICD-10-CM | POA: Diagnosis not present

## 2015-01-24 DIAGNOSIS — Z5181 Encounter for therapeutic drug level monitoring: Secondary | ICD-10-CM

## 2015-01-24 LAB — POCT CBC
GRANULOCYTE PERCENT: 87.1 % — AB (ref 37–80)
HEMATOCRIT: 39.3 % (ref 37.7–47.9)
Hemoglobin: 13.2 g/dL (ref 12.2–16.2)
Lymph, poc: 1.5 (ref 0.6–3.4)
MCH: 30.8 pg (ref 27–31.2)
MCHC: 33.5 g/dL (ref 31.8–35.4)
MCV: 91.8 fL (ref 80–97)
MID (CBC): 0.6 (ref 0–0.9)
MPV: 6.8 fL (ref 0–99.8)
PLATELET COUNT, POC: 320 10*3/uL (ref 142–424)
POC GRANULOCYTE: 14.2 — AB (ref 2–6.9)
POC LYMPH PERCENT: 9 %L — AB (ref 10–50)
POC MID %: 3.9 %M (ref 0–12)
RBC: 4.29 M/uL (ref 4.04–5.48)
RDW, POC: 13.3 %
WBC: 16.3 10*3/uL — AB (ref 4.6–10.2)

## 2015-01-24 LAB — COMPREHENSIVE METABOLIC PANEL
ALK PHOS: 59 U/L (ref 33–115)
ALT: 16 U/L (ref 6–29)
AST: 16 U/L (ref 10–30)
Albumin: 4.3 g/dL (ref 3.6–5.1)
BUN: 14 mg/dL (ref 7–25)
CO2: 25 mmol/L (ref 20–31)
Calcium: 9.1 mg/dL (ref 8.6–10.2)
Chloride: 105 mmol/L (ref 98–110)
Creat: 0.68 mg/dL (ref 0.50–1.10)
GLUCOSE: 89 mg/dL (ref 65–99)
POTASSIUM: 4.1 mmol/L (ref 3.5–5.3)
Sodium: 138 mmol/L (ref 135–146)
Total Bilirubin: 0.2 mg/dL (ref 0.2–1.2)
Total Protein: 6.8 g/dL (ref 6.1–8.1)

## 2015-01-24 MED ORDER — METHYLPREDNISOLONE ACETATE 80 MG/ML IJ SUSP
80.0000 mg | Freq: Once | INTRAMUSCULAR | Status: AC
  Administered 2015-01-24: 80 mg via INTRAMUSCULAR

## 2015-01-24 MED ORDER — CEFDINIR 300 MG PO CAPS: 300.0000 mg | ORAL_CAPSULE | Freq: Two times a day (BID) | ORAL | Status: DC

## 2015-01-24 MED ORDER — TERBINAFINE HCL 250 MG PO TABS: 250.0000 mg | ORAL_TABLET | Freq: Every day | ORAL | Status: DC

## 2015-01-24 NOTE — Patient Instructions (Signed)
INDICATION: LEFT WRIST JOINT PAIN  ADDITIONAL HISTORY: None  TECHNIQUE: Multiplanar multisequence surface coil imaging of the Left wrist was performed without contrast.  COMPARISON: None.  FINDINGS:  BONES and JOINTS: Decreased T1 signal within the lunate bone which is increased in signal on the PD fat-sat and STIR images. There is very slight irregularity along the inferior margin but no obvious fragmentation or fracture. The carpus otherwise  demonstrates normal marrow signal. There is normal alignment and positioning with borderline 1 to 2 mm ulnar minus variant. No effusion  DRUJ/TFCC: Slightly thickened with minimal degenerative signal within it no tear. There is very slight tendinosis of the ECU and minimal tenosynovitis.Tonya Perry.   INTEROSSEOUS LIGAMENTS: There is mild degeneration and fraying of the membranous portion of the scapholunate ligament, coronal PD fat-sat image 8. A full-thickness tear is not definitively identified. The lunotriquetral ligament appears intact.  EXTRINSIC LIGAMENTS/CAPSULE: The dorsal and volar extrinsic ligaments and capsule are unremarkable.  EXTENSOR TENDONS: Normal  CARPAL TUNNEL: The flexor tendons are normal. The medial nerve is normal. The flexor retinaculum is normal.  GUYONS CANAL: The ulnar nerve is normal  SOFT TISSUE: Tiny 5 mm cyst arising from the radiocarpal joint space just distal to the pronator quadratus muscle. There is no mass effect.   IMPRESSION:  1.   Signal changes within the lunate bone of uncertain etiology. 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 Recommend radiographic correlation for increasing sclerosis. Other possibilities such as stress change from  impaction syndrome, intraosseous ganglions or cysts or lunate fracture are less likely. 2.  Very slight ulnar minus variant with a slightly thickened and minimally degenerated TFCC. 3.   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  Electronically Signed by Corwin LevinsWilliam Smith

## 2015-01-24 NOTE — Progress Notes (Signed)
Subjective:    Patient ID: Tonya Perry, female    DOB: [birth year], 39 y.o.   MRN: [id number] Chief Complaint  Patient presents with  . Medication Refill    lamisil  . consultation  . depression screening    17    HPI   Has inserts in her shoes for plantar fasciitis for 1-2 wks - has had shots and a boot without improvement. Is still supposed to wear boot at night but she already struggles with insomnia so this keeps her.  She is getting some improvement with this - Dr. Charlsie Merlesegal for podiaty.  Went to see Dr. Raeanne BarryIbazibo at [facility] once in April - he thought she had FM and didn't have anything to over her and red  [facility] who recommended that she stay on cymbalta but she was having weight gain and evening appetite worse and hot flashes/sweaitng more so she is off of it but she is having pins/needing/tickling/crawling across her feet which is back (the cymbalta did help this.) On skelexin tid She is def doing better on zoloft.  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  She had a psychiatrist evaluate her who noted her issues but stated that she was not completely disabled.  Is working with [employer] and is second appeal.  Still seeing Dr. Dub MikesLugo - just had an appt with him - is only on zoloft but no cymbalta - up to adderall 3x/d. On neurontin tid and skelaxin. Benadryl qhs  Worsening diizness, imbalance. chronic HAs -> would like proceed to brain MRI.  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 Allergy   . Depression   . Anxiety   . Asthma   . Arthritis   . Substance abuse    Past Surgical History  Procedure Laterality Date  . Eye surgery Bilateral 2006  . Laparoscopic appendectomy N/A 11/04/2013    Procedure: APPENDECTOMY LAPAROSCOPIC;  Surgeon: Valarie MerinoMatthew B Martin, MD;  Location: [facility];   Service: General;  Laterality: N/A;  . Appendectomy  8/15   Current Outpatient Prescriptions on File Prior to Visit  Medication Sig Dispense Refill  . acetaminophen (TYLENOL) 500 MG tablet Take 500 mg by mouth.    . cetirizine (ZYRTEC) 10 MG tablet Take 1 tablet (10 mg total) by mouth at bedtime. (Patient taking differently: Take 10 mg by mouth daily as needed. ) 90 tablet 3  . Dextroamphetamine Sulfate 20 MG TABS Take 10 mg by mouth 3 (three) times daily.     . diclofenac sodium (VOLTAREN) 1 % GEL Apply 2 g topically 4 (four) times daily. 100 g 3  . diphenhydrAMINE (BENADRYL) 25 mg capsule Take 25 mg by mouth at bedtime as needed (1 -2 tablets as needed at bedtime). May do 50 mg    . EPIPEN 2-PAK 0.3 MG/0.3ML SOAJ injection Inject 0.3 mg as directed as needed (systemic reaction to seafood allergy).   1  . fish oil-omega-3 fatty acids 1000 MG capsule Take 1 g by mouth daily.    Marland Kitchen. gabapentin (NEURONTIN) 400 MG capsule Take 1 capsule (400 mg total) by mouth 4 (four) times daily. (Patient taking differently: Take 400 mg by mouth 3 (three) times daily. ) 360 capsule 0  . metaxalone (SKELAXIN) 800 MG tablet Take 1 tablet (800 mg total)  by mouth 3 (three) times daily. 270 tablet 1  . Multiple Vitamins-Minerals (MULTIVITAMIN WITH MINERALS) tablet Take 1 tablet by mouth daily.    Marland Kitchen PROAIR HFA 108 (90 BASE) MCG/ACT inhaler Inhale 2 puffs into the lungs as needed for wheezing (2 puffs as needed every 4-6h as needed for cough/wheeze).   0  . sertraline (ZOLOFT) 100 MG tablet Take 100 mg by mouth 2 (two) times daily.    Marland Kitchen Spacer/Aero-Holding Chambers (OPTICHAMBER DIAMOND) MISC   1  . fluticasone (FLONASE) 50 MCG/ACT nasal spray Place 2 sprays into both nostrils daily. 16 g 11   No current facility-administered medications on file prior to visit.   Allergies  Allergen Reactions  . Azithromycin Diarrhea    diarrhea  . Hydrocodone-Acetaminophen Itching  . Other Itching and Other (See Comments)    Peas,  Mango, Melons, Cantelope, Watermelons.  Dust mites, trees, grass and pollen.  Causes throat to swell with itching. Tree nuts, causes throat to swell with itching.  Marland Kitchen Pineapple Other (See Comments)    Causes throat to swell with itching.  . Shellfish Allergy   . Voltaren [Diclofenac Sodium] Other (See Comments)    Upset stomach; made pt feel like had to go to bathroom  . Penicillins Rash   Family History  Problem Relation Age of Onset  . Depression Mother   . Diabetes Mother   . Mental illness Mother   . COPD Father   . Mental illness Father   . Hypertension Father   . Hyperlipidemia Father   . Depression Maternal Grandmother   . Mental illness Maternal Grandmother   . Diabetes Maternal Grandfather   . Mental illness Maternal Grandfather   . Cancer Paternal Grandfather   . Mental illness Paternal Grandfather   . Mental illness Sister   . Mental illness Sister    Social History   Social History  . Marital Status: Married    Spouse Name: [place]  . Number of Children: 1  . Years of Education: Assoc   Occupational History  .      not employed   Social History Main Topics  . Smoking status: Former Smoker    Quit date: 04/06/1996  . Smokeless tobacco: Never Used  . Alcohol Use: 0.0 oz/week    0 Standard drinks or equivalent per week     Comment: occas.  . Drug Use: No  . Sexual Activity: Yes   Other Topics Concern  . None   Social History Narrative   Patient consumes 4-5 cups of caffeine daily.    Review of Systems  Constitutional: Positive for diaphoresis, activity change, fatigue and unexpected weight change. Negative for fever, chills and appetite change.  Musculoskeletal: Positive for myalgias, back pain, joint swelling, arthralgias, gait problem, neck pain and neck stiffness.  Skin: Positive for rash. Negative for color change.  Allergic/Immunologic: Positive for environmental allergies.  Neurological: Positive for dizziness, weakness, light-headedness,  numbness and headaches. Negative for syncope and facial asymmetry.  Psychiatric/Behavioral: Positive for sleep disturbance, dysphoric mood, decreased concentration and agitation. Negative for suicidal ideas and self-injury. The patient is nervous/anxious.        Objective:  BP 126/79 mmHg  Pulse 107  Temp(Src) 97.8 F (36.6 C) (Oral)  Resp 16  Wt 205 lb 12.8 oz (93.35 kg)  Physical Exam  Constitutional: She is oriented to person, place, and time. She appears well-developed and well-nourished. No distress.  HENT:  Head: Normocephalic and atraumatic.  Right Ear: External ear normal.  Left Ear: External ear normal.  Eyes: Conjunctivae are normal. No scleral icterus.  Neck: Normal range of motion. Neck supple. No thyromegaly present.  Cardiovascular: Normal rate, regular rhythm, normal heart sounds and intact distal pulses.   Pulmonary/Chest: Effort normal and breath sounds normal. No respiratory distress.  Musculoskeletal: She exhibits no edema.  Lymphadenopathy:    She has no cervical adenopathy.  Neurological: She is alert and oriented to person, place, and time.  Skin: Skin is warm and dry. She is not diaphoretic. No erythema.  Psychiatric: Her speech is normal and behavior is normal. Her mood appears anxious. Her affect is not angry, not blunt and not inappropriate. Cognition and memory are not impaired. She exhibits a depressed mood. She exhibits normal recent memory and normal remote memory.          Assessment & Plan:  Refer to hand surgery for Left wrist abnormal MRI and pain. Had NCV/EMG 1 yr prior 02/2014 that was normal. 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  COMPARISON: None.  FINDINGS:  BONES and JOINTS: Decreased T1 signal within the lunate bone which is increased in signal on the PD fat-sat and  STIR images. There is very slight irregularity along the inferior margin but no obvious fragmentation or fracture. The carpus otherwise  demonstrates normal marrow signal. There is normal alignment and positioning with borderline 1 to 2 mm ulnar minus variant. No effusion  DRUJ/TFCC: Slightly thickened with minimal degenerative signal within it no tear. There is very slight tendinosis of the ECU and minimal tenosynovitis.Rennie Plowman LIGAMENTS: There is mild degeneration and fraying of the membranous portion of the scapholunate ligament, coronal PD fat-sat image 8. A full-thickness tear is not definitively identified. The lunotriquetral ligament appears intact.  EXTRINSIC LIGAMENTS/CAPSULE: The dorsal and volar extrinsic ligaments and capsule are unremarkable.  EXTENSOR TENDONS: Normal  CARPAL TUNNEL: The flexor tendons are normal. The medial nerve is normal. The flexor retinaculum is normal.  GUYONS CANAL: The ulnar nerve is normal  SOFT TISSUE: Tiny 5 mm cyst arising from the radiocarpal joint space just distal to the pronator quadratus muscle. There is no mass effect.   IMPRESSION:  1.   Signal changes within the lunate bone of uncertain etiology. 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 Recommend radiographic correlation for increasing sclerosis. Other possibilities such as stress change from impaction syndrome, intraosseous ganglions or cysts or lunate fracture are less likely. 2.  Very slight ulnar minus variant with a slightly thickened and minimally degenerated TFCC. 3.   Degeneration of the scapholunate ligament but no discrete tear..  Electronically Signed by Corwin Levins  1. URI, acute   2. Medication monitoring encounter   3. Acute sinusitis, recurrence not specified, unspecified location     Orders Placed This Encounter  Procedures  . Comprehensive metabolic panel  . POCT CBC    Meds ordered this encounter  Medications  .  cefdinir (OMNICEF) 300 MG capsule    Sig: Take 1 capsule (300 mg total) by mouth 2 (two) times daily.    Dispense:  20 capsule    Refill:  0  . methylPREDNISolone acetate (DEPO-MEDROL) injection 80 mg    Sig:   . terbinafine (LAMISIL) 250 MG tablet    Sig: Take 1 tablet (250 mg total) by mouth daily.    Dispense:  90  tablet    Refill:  0   Over 40 min spent in face-to-face evaluation of and consultation with patient and coordination of care.  Over 50% of this time was spent counseling this patient.   [name], MD MPH  Results for orders placed or performed in visit on 01/24/15  Comprehensive metabolic panel  Result Value Ref Range   Sodium 138 135 - 146 mmol/L   Potassium 4.1 3.5 - 5.3 mmol/L   Chloride 105 98 - 110 mmol/L   CO2 25 20 - 31 mmol/L   Glucose, Bld 89 65 - 99 mg/dL   BUN 14 7 - 25 mg/dL   Creat 1.61 0.96 - 0.45 mg/dL   Total Bilirubin 0.2 0.2 - 1.2 mg/dL   Alkaline Phosphatase 59 33 - 115 U/L   AST 16 10 - 30 U/L   ALT 16 6 - 29 U/L   Total Protein 6.8 6.1 - 8.1 g/dL   Albumin 4.3 3.6 - 5.1 g/dL   Calcium 9.1 8.6 - 40.9 mg/dL  POCT CBC  Result Value Ref Range   WBC 16.3 (A) 4.6 - 10.2 K/uL   Lymph, poc 1.5 0.6 - 3.4   POC LYMPH PERCENT 9.0 (A) 10 - 50 %L   MID (cbc) 0.6 0 - 0.9   POC MID % 3.9 0 - 12 %M   POC Granulocyte 14.2 (A) 2 - 6.9   Granulocyte percent 87.1 (A) 37 - 80 %G   RBC 4.29 4.04 - 5.48 M/uL   Hemoglobin 13.2 12.2 - 16.2 g/dL   HCT, POC 81.1 91.4 - 47.9 %   MCV 91.8 80 - 97 fL   MCH, POC 30.8 27 - 31.2 pg   MCHC 33.5 31.8 - 35.4 g/dL   RDW, POC 78.2 %   Platelet Count, POC 320.0 142 - 424 K/uL   MPV 6.8 0 - 99.8 fL    .

## 2015-01-26 ENCOUNTER — Encounter: Payer: Self-pay | Admitting: Family Medicine

## 2015-02-07 ENCOUNTER — Encounter: Payer: Self-pay | Admitting: Family Medicine

## 2015-02-07 ENCOUNTER — Ambulatory Visit (INDEPENDENT_AMBULATORY_CARE_PROVIDER_SITE_OTHER): Payer: BLUE CROSS/BLUE SHIELD | Admitting: Family Medicine

## 2015-02-07 VITALS — BP 138/83 | HR 109 | Temp 97.8°F | Resp 16 | Wt 205.0 lb

## 2015-02-07 DIAGNOSIS — J029 Acute pharyngitis, unspecified: Secondary | ICD-10-CM

## 2015-02-07 DIAGNOSIS — M25532 Pain in left wrist: Secondary | ICD-10-CM | POA: Diagnosis not present

## 2015-02-07 DIAGNOSIS — M1990 Unspecified osteoarthritis, unspecified site: Secondary | ICD-10-CM

## 2015-02-07 DIAGNOSIS — M797 Fibromyalgia: Secondary | ICD-10-CM

## 2015-02-07 DIAGNOSIS — M5137 Other intervertebral disc degeneration, lumbosacral region: Secondary | ICD-10-CM

## 2015-02-07 DIAGNOSIS — J028 Acute pharyngitis due to other specified organisms: Secondary | ICD-10-CM

## 2015-02-07 DIAGNOSIS — B9789 Other viral agents as the cause of diseases classified elsewhere: Secondary | ICD-10-CM

## 2015-02-07 LAB — POCT CBC
GRANULOCYTE PERCENT: 74.8 % (ref 37–80)
HCT, POC: 39.2 % (ref 37.7–47.9)
HEMOGLOBIN: 12.9 g/dL (ref 12.2–16.2)
Lymph, poc: 1.8 (ref 0.6–3.4)
MCH: 30.7 pg (ref 27–31.2)
MCHC: 33 g/dL (ref 31.8–35.4)
MCV: 93 fL (ref 80–97)
MID (cbc): 0.5 (ref 0–0.9)
MPV: 6.8 fL (ref 0–99.8)
PLATELET COUNT, POC: 358 10*3/uL (ref 142–424)
POC Granulocyte: 6.8 (ref 2–6.9)
POC LYMPH PERCENT: 19.4 %L (ref 10–50)
POC MID %: 5.8 % (ref 0–12)
RBC: 4.21 M/uL (ref 4.04–5.48)
RDW, POC: 14.1 %
WBC: 9.1 10*3/uL (ref 4.6–10.2)

## 2015-02-07 LAB — POCT RAPID STREP A (OFFICE): Rapid Strep A Screen: NEGATIVE

## 2015-02-07 MED ORDER — HYDROCODONE-HOMATROPINE 5-1.5 MG/5ML PO SYRP: 5.0000 mL | ORAL_SOLUTION | Freq: Three times a day (TID) | ORAL | Status: DC | PRN

## 2015-02-07 MED ORDER — MAGIC MOUTHWASH W/LIDOCAINE: 10.0000 mL | ORAL | Status: DC | PRN

## 2015-02-07 NOTE — Patient Instructions (Signed)
You can call the 24-hour [facility] HelpLine at [phone] or [phone] for immediate assistance. Among several different types of services, they offer an Intensive oupatient program for mood disorders - which is a group type setting Monday-Friday 9-noon.  You can schedule an assessment by calling the above numbers during which the costs for the program and insurance benefits will be reviewed.    No psychological or psychiatric services take physician referrals - they always want the patient to call. Some excellent private psychiatrists for individual counseling are:  [facility] - therapist - not sure if they have a psychiatrist. [address]  [place], [place] [address] Phone: [phone]  [facility]  [address], [place], [place] [address]  [phone]  [facility] [address], [place], [place] [address]  Phone: [phone]) [phone]   [facility] [address], [place], [place] [address]  [phone]  [facility] [address] [place], [place] Phone: [phone]   [facility]  [address], [place], [place] [address]  [phone]) [phone]   Charlies SilversParish A. McKinney, MD, PA [address], [address] [place], [place] [address] Phone: [phone]

## 2015-02-07 NOTE — Progress Notes (Signed)
Subjective:    Patient ID: Tonya Perry, female    DOB: 09-26-1975, 39 y.o.   MRN: [id number] Chief Complaint  Patient presents with  . recheck illness    somthing swollen in throat  . left wrist    needs splint  . depression screening 14    HPI  Tonya Perry is here today as an  Urgent work in visit as she has still be feeling poorly.  I saw her 2 wks ago and we put her on omnicef bid x 10d which she completed 4d ago and tolerated well but she never got all the way better. Since antibiotic was comleted she has had worsening pharyngitis Lt>Rt and trouble swallowing. 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 Lots of illness exposures, but no known strep.  Is having a productivce cough productive of greens mucous but no SHoB or CP. She is having palpitations this mroning and has neot been sleeping well due to drainage and and pharyngitis pain.  No using any otc meds other than zyrtec. Not using the benadrul, flonase, albuterol or any decongestants.  No f/c. Is still having a lot of sinus pressure pain and neck/muscle tightness.  Her left wrist is getting worse. She had though she had a splint at home to use but it was to old and fallinbg apparty. She had an abnormal left wirst MRI last mo and has been referred to orthopedics. Did not place hand surg appt which she needs  Plantar fasciit is starting to improve and is being good about wearing inserts. SHe has f/u appt w/ podiatry next wk.  Past Medical History  Diagnosis Date  . Allergy   . Depression   . Anxiety   . Asthma   . Arthritis   . Substance abuse    Current Outpatient Prescriptions on File Prior to Visit  Medication Sig Dispense Refill  . acetaminophen (TYLENOL) 500 MG tablet Take 500 mg by mouth.    . cefdinir (OMNICEF) 300 MG capsule Take 1 capsule (300 mg total) by mouth 2 (two) times daily. 20 capsule 0  . cetirizine (ZYRTEC) 10 MG tablet Take 1 tablet (10 mg total)  by mouth at bedtime. (Patient taking differently: Take 10 mg by mouth daily as needed. ) 90 tablet 3  . Dextroamphetamine Sulfate 20 MG TABS Take 10 mg by mouth 3 (three) times daily.     . diclofenac sodium (VOLTAREN) 1 % GEL Apply 2 g topically 4 (four) times daily. 100 g 3  . diphenhydrAMINE (BENADRYL) 25 mg capsule Take 25 mg by mouth at bedtime as needed (1 -2 tablets as needed at bedtime). May do 50 mg    . EPIPEN 2-PAK 0.3 MG/0.3ML SOAJ injection Inject 0.3 mg as directed as needed (systemic reaction to seafood allergy).   1  . fish oil-omega-3 fatty acids 1000 MG capsule Take 1 g by mouth daily.    . fluticasone (FLONASE) 50 MCG/ACT nasal spray Place 2 sprays into both nostrils daily. 16 g 11  . gabapentin (NEURONTIN) 400 MG capsule Take 1 capsule (400 mg total) by mouth 4 (four) times daily. (Patient taking differently: Take 400 mg by mouth 3 (three) times daily. ) 360 capsule 0  . metaxalone (SKELAXIN) 800 MG tablet Take 1 tablet (800 mg total) by mouth 3 (three) times daily. 270 tablet 1  . Multiple Vitamins-Minerals (MULTIVITAMIN WITH MINERALS) tablet Take 1 tablet by  mouth daily.    Marland Kitchen PROAIR HFA 108 (90 BASE) MCG/ACT inhaler Inhale 2 puffs into the lungs as needed for wheezing (2 puffs as needed every 4-6h as needed for cough/wheeze).   0  . sertraline (ZOLOFT) 100 MG tablet Take 100 mg by mouth 2 (two) times daily.    Marland Kitchen Spacer/Aero-Holding Chambers [facility] DIAMOND) MISC   1  . terbinafine (LAMISIL) 250 MG tablet Take 1 tablet (250 mg total) by mouth daily. 90 tablet 0   No current facility-administered medications on file prior to visit.   Allergies  Allergen Reactions  . Azithromycin Diarrhea    diarrhea  . Hydrocodone-Acetaminophen Itching  . Other Itching and Other (See Comments)    Peas, Mango, Melons, Cantelope, Watermelons.  Dust mites, trees, grass and pollen.  Causes throat to swell with itching. Tree nuts, causes throat to swell with itching.  Marland Kitchen Pineapple Other  (See Comments)    Causes throat to swell with itching.  . Shellfish Allergy   . Voltaren [Diclofenac Sodium] Other (See Comments)    Upset stomach; made pt feel like had to go to bathroom  . Penicillins Rash    Review of Systems  Constitutional: Positive for diaphoresis, activity change and fatigue. Negative for fever, chills, appetite change and unexpected weight change.  HENT: Positive for congestion, ear pain, postnasal drip, rhinorrhea and sore throat. Negative for facial swelling, nosebleeds and voice change.   Respiratory: Positive for cough and chest tightness. Negative for shortness of breath.   Cardiovascular: Positive for leg swelling.  Gastrointestinal: Negative for nausea, vomiting, abdominal pain, diarrhea and constipation.  Genitourinary: Positive for flank pain. Negative for urgency, frequency, decreased urine volume and difficulty urinating.  Musculoskeletal: Positive for myalgias, back pain, joint swelling, arthralgias and gait problem.  Neurological: Positive for headaches. Negative for facial asymmetry, weakness and numbness.  Hematological: Negative for adenopathy. Does not bruise/bleed easily.  Psychiatric/Behavioral: Positive for sleep disturbance.       Objective:  BP 138/83 mmHg  Pulse 109  Temp(Src) 97.8 F (36.6 C) (Oral)  Resp 16  Wt 205 lb (92.987 kg)  Physical Exam  Constitutional: She is oriented to person, place, and time. She appears well-developed and well-nourished. She appears lethargic. She appears ill. No distress.  HENT:  Head: Normocephalic and atraumatic.  Right Ear: External ear and ear canal normal. Tympanic membrane is retracted. A middle ear effusion is present.  Left Ear: External ear and ear canal normal. Tympanic membrane is retracted. A middle ear effusion is present.  Nose: Mucosal edema and rhinorrhea present. Right sinus exhibits maxillary sinus tenderness. Left sinus exhibits maxillary sinus tenderness.  Mouth/Throat: Uvula is  midline and mucous membranes are normal. Posterior oropharyngeal erythema present. No oropharyngeal exudate, posterior oropharyngeal edema or tonsillar abscesses.  Eyes: Conjunctivae are normal. Right eye exhibits no discharge. Left eye exhibits no discharge. No scleral icterus.  Neck: Normal range of motion. Neck supple.  Cardiovascular: Normal rate, regular rhythm, normal heart sounds and intact distal pulses.   Pulmonary/Chest: Effort normal and breath sounds normal.  Lymphadenopathy:       Head (right side): Submandibular adenopathy present. No preauricular and no posterior auricular adenopathy present.       Head (left side): Submandibular adenopathy present. No preauricular and no posterior auricular adenopathy present.    She has no cervical adenopathy.       Right: No supraclavicular adenopathy present.       Left: No supraclavicular adenopathy present.  Neurological: She is oriented  to person, place, and time. She appears lethargic.  Skin: Skin is warm and dry. She is not diaphoretic. No erythema.  Psychiatric: Her speech is normal. Her mood appears anxious. Cognition and memory are normal. She exhibits a depressed mood.          Results for orders placed or performed in visit on 02/07/15  Culture, Group A Strep  Result Value Ref Range   Organism ID, Bacteria Normal Upper Respiratory Flora    Organism ID, Bacteria No Beta Hemolytic Streptococci Isolated   POCT CBC  Result Value Ref Range   WBC 9.1 4.6 - 10.2 K/uL   Lymph, poc 1.8 0.6 - 3.4   POC LYMPH PERCENT 19.4 10 - 50 %L   MID (cbc) 0.5 0 - 0.9   POC MID % 5.8 0 - 12 %M   POC Granulocyte 6.8 2 - 6.9   Granulocyte percent 74.8 37 - 80 %G   RBC 4.21 4.04 - 5.48 M/uL   Hemoglobin 12.9 12.2 - 16.2 g/dL   HCT, POC 04.539.2 40.937.7 - 47.9 %   MCV 93.0 80 - 97 fL   MCH, POC 30.7 27 - 31.2 pg   MCHC 33.0 31.8 - 35.4 g/dL   RDW, POC 81.114.1 %   Platelet Count, POC 358.0 142 - 424 K/uL   MPV 6.8 0 - 99.8 fL  POCT rapid strep A    Result Value Ref Range   Rapid Strep A Screen Negative Negative    Assessment & Plan:   1. Acute pharyngitis, unspecified pharyngitis type - pt reassured  2. Acute viral pharyngitis   3. Arthralgia of left wrist   4. Fibromyalgia   5. Degeneration, intervertebral disc, lumbosacral   6. Arthritis     Orders Placed This Encounter  Procedures  . Culture, Group A Strep  . Ambulatory referral to Orthopedic Surgery    Referral Priority:  Routine    Referral Type:  Surgical    Referral Reason:  Specialty Services Required    Requested Specialty:  Orthopedic Surgery    Number of Visits Requested:  1  . Ambulatory referral to Physical Therapy    Referral Priority:  Routine    Referral Type:  Physical Medicine    Referral Reason:  Specialty Services Required    Requested Specialty:  Physical Therapy    Number of Visits Requested:  1  . POCT CBC  . POCT rapid strep A    Meds ordered this encounter  Medications  . magic mouthwash w/lidocaine SOLN    Sig: Take 10 mLs by mouth every 2 (two) hours as needed for mouth pain.    Dispense:  360 mL    Refill:  0    Ok to use pharmacy formulary.  Or Nystatin, diphenhydromine, hydrocortisone, and lidocaine in 1:1:1:1 ratio  . HYDROcodone-homatropine (HYCODAN) 5-1.5 MG/5ML syrup    Sig: Take 5 mLs by mouth every 8 (eight) hours as needed for cough.    Dispense:  90 mL    Refill:  0    Norberto SorensonEva Shaw, MD MPH

## 2015-02-08 LAB — CULTURE, GROUP A STREP: Organism ID, Bacteria: NORMAL

## 2015-02-14 ENCOUNTER — Ambulatory Visit: Payer: BLUE CROSS/BLUE SHIELD

## 2015-02-14 ENCOUNTER — Ambulatory Visit (INDEPENDENT_AMBULATORY_CARE_PROVIDER_SITE_OTHER): Payer: BLUE CROSS/BLUE SHIELD | Admitting: Podiatry

## 2015-02-14 ENCOUNTER — Ambulatory Visit (INDEPENDENT_AMBULATORY_CARE_PROVIDER_SITE_OTHER): Payer: BLUE CROSS/BLUE SHIELD

## 2015-02-14 ENCOUNTER — Encounter: Payer: Self-pay | Admitting: Podiatry

## 2015-02-14 VITALS — BP 123/83 | HR 105 | Resp 16

## 2015-02-14 DIAGNOSIS — M79672 Pain in left foot: Secondary | ICD-10-CM

## 2015-02-14 DIAGNOSIS — M21619 Bunion of unspecified foot: Secondary | ICD-10-CM

## 2015-02-14 DIAGNOSIS — M722 Plantar fascial fibromatosis: Secondary | ICD-10-CM | POA: Diagnosis not present

## 2015-02-14 DIAGNOSIS — M79671 Pain in right foot: Secondary | ICD-10-CM

## 2015-02-14 NOTE — Patient Instructions (Signed)
Bunionectomy A bunionectomy is a surgical procedure to remove a bunion. A bunion is a visible bump of bone on the inside of your foot where your big toe meets the rest of your foot. A bunion can develop when pressure turns this bone (first metatarsal) toward the other toes. Shoes that are too tight are the most common cause of bunions. Bunions can also be caused by diseases, such as arthritis and polio. You may need a bunionectomy if your bunion is very large and painful or it affects your ability to walk. LET YOUR HEALTH CARE PROVIDER KNOW ABOUT:  Any allergies you have.  All medicines you are taking, including vitamins, herbs, eye drops, creams, and over-the-counter medicines.  Previous problems you or members of your family have had with the use of anesthetics.  Any blood disorders you have.  Previous surgeries you have had.  Medical conditions you have. RISKS AND COMPLICATIONS  Generally, this is a safe procedure. However, problems may occur, including:  Infection.  Pain.  Nerve damage.  Bleeding or blood clots.  Reactions to medicines.  Numbness, stiffness, or arthritis in your toe.  Foot problems that continue even after the procedure. BEFORE THE PROCEDURE  Ask your health care provider about:  Changing or stopping your regular medicines. This is especially important if you are taking diabetes medicines or blood thinners.  Taking medicines such as aspirin and ibuprofen. These medicines can thin your blood. Do not take these medicines before your procedure if your health care provider instructs you not to.  Do not drink alcohol before the procedure as directed by your health care provider.  Do not use tobacco products, including cigarettes, chewing tobacco, or electronic cigarettes, before the procedure as directed by your health care provider. If you need help quitting, ask your health care provider.  Ask your health care provider what kind of medicine you will be  given during your procedure. A bunionectomy may be done using one of these:  A medicine that numbs the area (local anesthetic).  A medicine that makes you go to sleep (general anesthetic). If you will be given general anesthetic, do not eat or drink anything after midnight on the night before the procedure or as directed by your health care provider. PROCEDURE  An IV tube may be inserted into a vein.  You will be given local anesthetic or general anesthetic.  The surgeon will make a cut (incision) over the enlarged area at the first joint of the big toe. The surgeon will remove the bunion.  You may have more than one incision if any of the bones in your big toe need to be moved. A bone itself may need to be cut.  Sometimes the tissues around the big toe may also need to be cut then tightened or loosened to reposition the toe.  Screws or other hardware may be used to keep your foot in thecorrect position.  The incision will be closed with stitches (sutures) and covered with adhesive strips or another type of bandage (dressing). AFTER THE PROCEDURE  You may spend some time in a recovery area.  Your blood pressure, heart rate, breathing rate, and blood oxygen level will be monitored often until the medicines you were given have worn off.   This information is not intended to replace advice given to you by your health care provider. Make sure you discuss any questions you have with your health care provider.   Document Released: 03/06/2005 Document Revised: 12/12/2014 Document Reviewed: 11/08/2013   Elsevier Interactive Patient Education 2016 [employer]  

## 2015-02-15 NOTE — Progress Notes (Signed)
Subjective:     Patient ID: Tonya Perry, female   DOB: February 25, 1976, 39 y.o.   MRN: [id number]  HPI patient states the pain is improving but I'm still having some discomfort in my arch at times. Might bunions also have been more noticeable and can bother me at times   Review of Systems     Objective:   Physical Exam Neurovascular status intact muscle strength adequate with continued discomfort in the left arch of a moderate nature. Patient does have moderate structural bunion deformity with redness around the first metatarsal head bilateral with significant family history    Assessment:     Plantar fasciitis that's moderating along with structural bunion deformity bilateral    Plan:     Reviewed conditions and x-rays from today concerning bunions. These can be corrected and I discussed Austin-type osteotomy which would be necessary and at this time patient will contemplate this for the future. As far as the arch we will continue orthotics stretching and supportive shoes and patient will be seen back as needed

## 2015-02-22 ENCOUNTER — Encounter: Payer: Self-pay | Admitting: Family Medicine

## 2015-03-15 ENCOUNTER — Telehealth: Payer: Self-pay | Admitting: Family Medicine

## 2015-03-15 NOTE — Telephone Encounter (Signed)
Patient would like for Dr Clelia CroftShaw to refer her to another PT for her back its [facility] wherever her insurance will accept her she does not like the PT that she has been going to [facility] call patient at [phone]

## 2015-03-15 NOTE — Telephone Encounter (Signed)
That's fine

## 2015-04-03 DIAGNOSIS — F419 Anxiety disorder, unspecified: Secondary | ICD-10-CM

## 2015-04-03 HISTORY — DX: Anxiety disorder, unspecified: F41.9

## 2015-04-13 ENCOUNTER — Encounter: Payer: Self-pay | Admitting: Family Medicine

## 2015-04-15 HISTORY — PX: WRIST RECONSTRUCTION: SHX2675

## 2015-04-21 ENCOUNTER — Other Ambulatory Visit: Payer: Self-pay | Admitting: Family Medicine

## 2015-05-02 ENCOUNTER — Ambulatory Visit: Payer: BLUE CROSS/BLUE SHIELD | Admitting: Family Medicine

## 2015-05-09 ENCOUNTER — Encounter: Payer: Self-pay | Admitting: Family Medicine

## 2015-05-09 ENCOUNTER — Ambulatory Visit (INDEPENDENT_AMBULATORY_CARE_PROVIDER_SITE_OTHER): Payer: BLUE CROSS/BLUE SHIELD | Admitting: Family Medicine

## 2015-05-09 VITALS — BP 128/80 | HR 101 | Temp 98.2°F | Resp 16 | Wt 205.4 lb

## 2015-05-09 DIAGNOSIS — M797 Fibromyalgia: Secondary | ICD-10-CM

## 2015-05-09 DIAGNOSIS — G5691 Unspecified mononeuropathy of right upper limb: Secondary | ICD-10-CM | POA: Diagnosis not present

## 2015-05-09 DIAGNOSIS — M25531 Pain in right wrist: Secondary | ICD-10-CM | POA: Diagnosis not present

## 2015-05-09 DIAGNOSIS — B353 Tinea pedis: Secondary | ICD-10-CM | POA: Diagnosis not present

## 2015-05-09 DIAGNOSIS — G8929 Other chronic pain: Secondary | ICD-10-CM

## 2015-05-09 MED ORDER — OXYCODONE-ACETAMINOPHEN 5-325 MG PO TABS: 1.0000 | ORAL_TABLET | Freq: Four times a day (QID) | ORAL | Status: DC | PRN

## 2015-05-09 MED ORDER — CLOTRIMAZOLE 1 % EX CREA
1.0000 | TOPICAL_CREAM | Freq: Two times a day (BID) | CUTANEOUS | Status: DC
Start: 2015-05-09 — End: 2015-12-05

## 2015-05-09 NOTE — Progress Notes (Signed)
Subjective:    Patient ID: Tonya Perry, female    DOB: 1975/08/25, 40 y.o.   MRN: [id number] Chief Complaint  Patient presents with  . Fibromyalgia  . Anxiety  . Depression    HPI Her 2 week follow-up  Surg Jan 9 went fine but she has had horrible pain since - been very difficult to control.  She has an appoint on 2/7 and put her under to take out a metal piece. She is in a cast now.  She had to get on dilaudid to control pain but caused constipation. And then switched to percocet but making her depressed. She is feeling like her right wrist She is having worsening numbness and tingling in her right wrist and feeling more pain in the dorsal radial aspect of her hand - is having numbness of 1st through 3rd.  She is out of percocet. THis was done by Dr. Gilman Schmidt.   More depressed. Car, husband, daughter. Started doing psychiatry with Tamela Oddi and Dr. Betti Cruz - seeing an old therapist at [facility].  They started her resulti (new add-on) for her me.  [facility] and they did some work on the trigger points which was helpful - she has taken a break for the past month for her surgery but has to wait until her insurance resumes.  FM worse.  Patella alta is also chronic.  Also saw Dr. Charlsie Merles with podiatry - has inserts now which help but has bad bunions. Saw rheumatology - switched her to a new woman -  Planning to see her back one more time to r/o RA. Has f/u in May.  Think gabapentin helps with anxiety or depression - not sure if it is helping with pain. \still taking the skelaxin.  Past Medical History  Diagnosis Date  . Allergy   . Depression   . Anxiety   . Asthma   . Arthritis   . Substance abuse    Current Outpatient Prescriptions on File Prior to Visit  Medication Sig Dispense Refill  . acetaminophen (TYLENOL) 500 MG tablet Take 500 mg by mouth.    . cetirizine (ZYRTEC) 10 MG tablet Take 1 tablet (10 mg total) by mouth  at bedtime. (Patient taking differently: Take 10 mg by mouth daily as needed. ) 90 tablet 3  . Dextroamphetamine Sulfate 20 MG TABS Take 10 mg by mouth 3 (three) times daily.     . diclofenac sodium (VOLTAREN) 1 % GEL Apply 2 g topically 4 (four) times daily. 100 g 3  . diphenhydrAMINE (BENADRYL) 25 mg capsule Take 25 mg by mouth at bedtime as needed (1 -2 tablets as needed at bedtime). May do 50 mg    . EPIPEN 2-PAK 0.3 MG/0.3ML SOAJ injection Inject 0.3 mg as directed as needed (systemic reaction to seafood allergy).   1  . fish oil-omega-3 fatty acids 1000 MG capsule Take 1 g by mouth daily.    . fluticasone (FLONASE) 50 MCG/ACT nasal spray Place 2 sprays into both nostrils daily. 16 g 11  . gabapentin (NEURONTIN) 400 MG capsule Take 1 capsule (400 mg total) by mouth 4 (four) times daily. (Patient taking differently: Take 400 mg by mouth 3 (three) times daily. ) 360 capsule 0  . HYDROcodone-homatropine (HYCODAN) 5-1.5 MG/5ML syrup Take 5 mLs by mouth every 8 (eight) hours as needed for cough. 90 mL 0  . magic mouthwash w/lidocaine SOLN Take 10 mLs by mouth every 2 (two)  hours as needed for mouth pain. 360 mL 0  . metaxalone (SKELAXIN) 800 MG tablet Take 1 tablet (800 mg total) by mouth 3 (three) times daily. 270 tablet 1  . Multiple Vitamins-Minerals (MULTIVITAMIN WITH MINERALS) tablet Take 1 tablet by mouth daily.    Marland Kitchen PROAIR HFA 108 (90 BASE) MCG/ACT inhaler Inhale 2 puffs into the lungs as needed for wheezing (2 puffs as needed every 4-6h as needed for cough/wheeze).   0  . sertraline (ZOLOFT) 100 MG tablet Take 100 mg by mouth 2 (two) times daily.    Marland Kitchen Spacer/Aero-Holding Chambers (OPTICHAMBER DIAMOND) MISC   1  . terbinafine (LAMISIL) 250 MG tablet TAKE 1 TABLET (250 MG TOTAL) BY MOUTH DAILY 90 tablet 0   No current facility-administered medications on file prior to visit.   Allergies  Allergen Reactions  . Azithromycin Diarrhea    diarrhea  . Hydrocodone-Acetaminophen Itching  .  Other Itching and Other (See Comments)    Peas, Mango, Melons, Cantelope, Watermelons.  Dust mites, trees, grass and pollen.  Causes throat to swell with itching. Tree nuts, causes throat to swell with itching.  Marland Kitchen Pineapple Other (See Comments)    Causes throat to swell with itching.  . Shellfish Allergy   . Voltaren [Diclofenac Sodium] Other (See Comments)    Upset stomach; made pt feel like had to go to bathroom  . Penicillins Rash   Depression screen [facility] 2/9 05/09/2015 02/07/2015 01/24/2015 09/07/2014 07/06/2014  Decreased Interest Down, Depressed, Hopeless PHQ - 2 Score Altered sleeping 0 1  Tired, decreased energy Change in appetite 0 - Feeling bad or failure about yourself  Trouble concentrating Moving slowly or fidgety/restless 0 0 0 3 1  Suicidal thoughts 0 0 0 0 0  PHQ-9 Score Difficult doing work/chores - - - Extremely dIfficult -      Review of Systems  Constitutional: Positive for activity change and fatigue. Negative for fever, chills, appetite change and unexpected weight change.  Musculoskeletal: Positive for myalgias, back pain, joint swelling and arthralgias. Negative for gait problem.  Skin: Positive for wound. Negative for color change and rash.  Neurological: Positive for weakness and numbness.  Psychiatric/Behavioral: Positive for sleep disturbance, dysphoric mood and decreased concentration. Negative for suicidal ideas. The patient is nervous/anxious.        Objective:  BP 128/80 mmHg  Pulse 101  Temp(Src) 98.2 F (36.8 C) (Oral)  Resp 16  Wt 205 lb 6.4 oz (93.169 kg)  Physical Exam  Constitutional: She is oriented to person, place, and time. She appears well-developed and well-nourished. No distress.  HENT:  Head: Normocephalic and atraumatic.  Right Ear: External ear normal.  Left Ear: External ear normal.  Eyes: Conjunctivae are normal. No scleral icterus.    Neck: Normal range of motion. Neck supple. No thyromegaly present.  Cardiovascular: Normal rate, regular rhythm, normal heart sounds and intact distal pulses.   Pulmonary/Chest: Effort normal and breath sounds normal. No respiratory distress.  Musculoskeletal: She exhibits no edema.  Lymphadenopathy:    She has no cervical adenopathy.  Neurological: She is alert and oriented to person, place, and time.  Skin: Skin is warm and dry. She is not diaphoretic. No erythema.  Psychiatric:  She has a normal mood and affect. Her behavior is normal.          Assessment & Plan:  Encouraged pt to let her orthopedist know about worsening pain in her rigtht wrist again at next her appt.  May need MRI. Left Wrist surg 2 wks prior, will provide one-time refill of oxycodone for acute surgical pain.   1. Tinea pedis of both feet   2. Fibromyalgia   3. Wrist pain, chronic, right   4. Neuropathy of right upper extremity     Meds ordered this encounter  Medications  . Brexpiprazole (REXULTI) 1 MG TABS    Sig: Take by mouth.  . oxyCODONE-acetaminophen (ROXICET) 5-325 MG tablet    Sig: Take 1 tablet by mouth every 6 (six) hours as needed for severe pain.    Dispense:  40 tablet    Refill:  0  . clotrimazole (LOTRIMIN) 1 % cream    Sig: Apply 1 application topically 2 (two) times daily.    Dispense:  113 g    Refill:  0    Over 25 min spent in face-to-face evaluation of and consultation with patient and coordination of care.  Over 50% of this time was spent counseling this patient.  [name], MD MPH

## 2015-05-28 ENCOUNTER — Telehealth: Payer: Self-pay

## 2015-05-28 DIAGNOSIS — M25531 Pain in right wrist: Secondary | ICD-10-CM

## 2015-05-28 DIAGNOSIS — G8929 Other chronic pain: Secondary | ICD-10-CM

## 2015-05-28 DIAGNOSIS — M25532 Pain in left wrist: Principal | ICD-10-CM

## 2015-05-28 NOTE — Telephone Encounter (Signed)
Waiting on peroset refill - her dr is out of town.   [facility]eds [facility].   [phone]

## 2015-05-28 NOTE — Telephone Encounter (Signed)
PATIENT WOULD LIKE A NEW REFERRAL TO [facility].   SHE WANTED A REFILL ON HER PERCOCET BUT SHE HAD ALREADY CONTACTED HER SURGEON FOR THE REFILL SO I ASKED HER TO LET HIM TAKE CARE OF THAT PRESCRIPTION SINCE HE WROTE IT FOR HER LAST TIME.   HOPE THAT IS THE CORRECT RESPONSE,.

## 2015-05-29 ENCOUNTER — Other Ambulatory Visit: Payer: Self-pay | Admitting: Family Medicine

## 2015-05-30 NOTE — Telephone Encounter (Signed)
Excellent response gail. PT refill placed. Yes, pt needs to get any additional narcotic refills from her surgeon since that is what the oxycodone is being used to treat. We need to get her off of narcotics as soon as we can - i know she has had a lot of more pain from her surgery than she expected but now that she is a month out, it is time to come off of it.  I know she has chronic pain but I think staying on the opiates would be mentally and physically harmful for her in the long run but if this is going to be necessary, let me know so we can refer her to a pain clinic as will usually take 1-2 mos to get it.

## 2015-05-31 NOTE — Telephone Encounter (Signed)
Left a message for patient to return call.

## 2015-06-06 ENCOUNTER — Encounter (HOSPITAL_BASED_OUTPATIENT_CLINIC_OR_DEPARTMENT_OTHER): Payer: Self-pay | Admitting: *Deleted

## 2015-06-06 NOTE — Progress Notes (Signed)
NPO AFTER MN WITH EXCEPTION CLEAR LIQUIDS UNTIL 0830 (NO CREAM/ MILK PRODUCTS).  ARRIVE AT 1300.  NEEDS HG AND URINE PREG. WILL TAKE NEURONTIN AND ZOLOFT AM DOS W/ SIPS OF WATER.

## 2015-06-13 ENCOUNTER — Encounter (HOSPITAL_BASED_OUTPATIENT_CLINIC_OR_DEPARTMENT_OTHER): Payer: Self-pay | Admitting: Certified Registered"

## 2015-06-13 ENCOUNTER — Ambulatory Visit (HOSPITAL_BASED_OUTPATIENT_CLINIC_OR_DEPARTMENT_OTHER)
Admission: RE | Admit: 2015-06-13 | Discharge: 2015-06-13 | Disposition: A | Discharge status: Home / Self Care | Payer: BLUE CROSS/BLUE SHIELD | Source: Ambulatory Visit | Attending: Orthopedic Surgery | Admitting: Orthopedic Surgery

## 2015-06-13 ENCOUNTER — Ambulatory Visit (HOSPITAL_BASED_OUTPATIENT_CLINIC_OR_DEPARTMENT_OTHER): Payer: BLUE CROSS/BLUE SHIELD | Admitting: Anesthesiology

## 2015-06-13 ENCOUNTER — Encounter (HOSPITAL_BASED_OUTPATIENT_CLINIC_OR_DEPARTMENT_OTHER)
Admission: RE | Disposition: A | Discharge status: Home / Self Care | Payer: Self-pay | Source: Ambulatory Visit | Attending: Orthopedic Surgery

## 2015-06-13 DIAGNOSIS — Z6835 Body mass index (BMI) 35.0-35.9, adult: Secondary | ICD-10-CM | POA: Insufficient documentation

## 2015-06-13 DIAGNOSIS — Z79899 Other long term (current) drug therapy: Secondary | ICD-10-CM | POA: Insufficient documentation

## 2015-06-13 DIAGNOSIS — M797 Fibromyalgia: Secondary | ICD-10-CM | POA: Diagnosis not present

## 2015-06-13 DIAGNOSIS — F1120 Opioid dependence, uncomplicated: Secondary | ICD-10-CM | POA: Insufficient documentation

## 2015-06-13 DIAGNOSIS — Z4689 Encounter for fitting and adjustment of other specified devices: Secondary | ICD-10-CM | POA: Insufficient documentation

## 2015-06-13 DIAGNOSIS — D509 Iron deficiency anemia, unspecified: Secondary | ICD-10-CM | POA: Diagnosis not present

## 2015-06-13 DIAGNOSIS — M1990 Unspecified osteoarthritis, unspecified site: Secondary | ICD-10-CM | POA: Diagnosis not present

## 2015-06-13 DIAGNOSIS — Z791 Long term (current) use of non-steroidal anti-inflammatories (NSAID): Secondary | ICD-10-CM | POA: Insufficient documentation

## 2015-06-13 DIAGNOSIS — E669 Obesity, unspecified: Secondary | ICD-10-CM | POA: Insufficient documentation

## 2015-06-13 DIAGNOSIS — F329 Major depressive disorder, single episode, unspecified: Secondary | ICD-10-CM | POA: Diagnosis not present

## 2015-06-13 DIAGNOSIS — F419 Anxiety disorder, unspecified: Secondary | ICD-10-CM | POA: Diagnosis not present

## 2015-06-13 DIAGNOSIS — T402X5A Adverse effect of other opioids, initial encounter: Secondary | ICD-10-CM | POA: Diagnosis not present

## 2015-06-13 DIAGNOSIS — Z87891 Personal history of nicotine dependence: Secondary | ICD-10-CM | POA: Diagnosis not present

## 2015-06-13 DIAGNOSIS — J45909 Unspecified asthma, uncomplicated: Secondary | ICD-10-CM | POA: Insufficient documentation

## 2015-06-13 DIAGNOSIS — S63005D Unspecified dislocation of left wrist and hand, subsequent encounter: Secondary | ICD-10-CM

## 2015-06-13 HISTORY — DX: Fibromyalgia: M79.7

## 2015-06-13 HISTORY — DX: Osteochondrosis (juvenile) of carpal lunate (kienbock), unspecified hand: M92.219

## 2015-06-13 HISTORY — DX: Pain in left wrist: M25.532

## 2015-06-13 HISTORY — PX: REMOVAL OF IMPLANT: SHX6451

## 2015-06-13 HISTORY — DX: Iron deficiency anemia, unspecified: D50.9

## 2015-06-13 LAB — POCT HEMOGLOBIN-HEMACUE: Hemoglobin: 12.8 g/dL (ref 12.0–15.0)

## 2015-06-13 LAB — POCT PREGNANCY, URINE: PREG TEST UR: NEGATIVE

## 2015-06-13 SURGERY — REMOVAL OF IMPLANT
Anesthesia: General | Site: Wrist | Laterality: Left

## 2015-06-13 MED ORDER — PROPOFOL 10 MG/ML IV BOLUS
INTRAVENOUS | Status: AC
  Filled 2015-06-13: qty 20

## 2015-06-13 MED ORDER — SODIUM CHLORIDE 0.9 % IR SOLN
Status: DC | PRN
  Administered 2015-06-13: 500 mL

## 2015-06-13 MED ORDER — BUPIVACAINE HCL 0.25 % IJ SOLN
INTRAMUSCULAR | Status: DC | PRN
  Administered 2015-06-13: 10 mL

## 2015-06-13 MED ORDER — MIDAZOLAM HCL 2 MG/2ML IJ SOLN
INTRAMUSCULAR | Status: AC
  Filled 2015-06-13: qty 2

## 2015-06-13 MED ORDER — CLINDAMYCIN PHOSPHATE 900 MG/50ML IV SOLN
900.0000 mg | INTRAVENOUS | Status: AC
  Administered 2015-06-13: 900 mg via INTRAVENOUS
  Filled 2015-06-13: qty 50

## 2015-06-13 MED ORDER — LIDOCAINE HCL (CARDIAC) 20 MG/ML IV SOLN
INTRAVENOUS | Status: DC | PRN
  Administered 2015-06-13: 60 mg via INTRAVENOUS

## 2015-06-13 MED ORDER — DEXAMETHASONE SODIUM PHOSPHATE 4 MG/ML IJ SOLN
INTRAMUSCULAR | Status: DC | PRN
  Administered 2015-06-13: 10 mg via INTRAVENOUS

## 2015-06-13 MED ORDER — FENTANYL CITRATE (PF) 100 MCG/2ML IJ SOLN
INTRAMUSCULAR | Status: AC
  Filled 2015-06-13: qty 2

## 2015-06-13 MED ORDER — FENTANYL CITRATE (PF) 100 MCG/2ML IJ SOLN
25.0000 ug | INTRAMUSCULAR | Status: DC | PRN
  Administered 2015-06-13: 50 ug via INTRAVENOUS
  Filled 2015-06-13: qty 1

## 2015-06-13 MED ORDER — ONDANSETRON HCL 4 MG/2ML IJ SOLN
INTRAMUSCULAR | Status: AC
  Filled 2015-06-13: qty 2

## 2015-06-13 MED ORDER — OXYCODONE HCL 5 MG PO TABS
ORAL_TABLET | ORAL | Status: AC
  Filled 2015-06-13: qty 2

## 2015-06-13 MED ORDER — LIDOCAINE HCL 1 % IJ SOLN: INTRAMUSCULAR | Status: DC | PRN

## 2015-06-13 MED ORDER — PROPOFOL 10 MG/ML IV BOLUS
INTRAVENOUS | Status: DC | PRN
  Administered 2015-06-13: 200 mg via INTRAVENOUS

## 2015-06-13 MED ORDER — DEXAMETHASONE SODIUM PHOSPHATE 10 MG/ML IJ SOLN
INTRAMUSCULAR | Status: AC
  Filled 2015-06-13: qty 1

## 2015-06-13 MED ORDER — DOCUSATE SODIUM 100 MG PO CAPS: 100.0000 mg | ORAL_CAPSULE | Freq: Two times a day (BID) | ORAL | Status: DC

## 2015-06-13 MED ORDER — ONDANSETRON HCL 4 MG/2ML IJ SOLN
INTRAMUSCULAR | Status: DC | PRN
Start: 2015-06-13 — End: 2015-06-13
  Administered 2015-06-13: 4 mg via INTRAVENOUS

## 2015-06-13 MED ORDER — CHLORHEXIDINE GLUCONATE 4 % EX LIQD
60.0000 mL | Freq: Once | CUTANEOUS | Status: DC
Start: 2015-06-13 — End: 2015-06-13
  Filled 2015-06-13: qty 60

## 2015-06-13 MED ORDER — OXYCODONE HCL 5 MG/5ML PO SOLN
5.0000 mg | Freq: Once | ORAL | Status: AC | PRN
  Filled 2015-06-13: qty 5

## 2015-06-13 MED ORDER — ONDANSETRON HCL 4 MG/2ML IJ SOLN
4.0000 mg | Freq: Once | INTRAMUSCULAR | Status: DC | PRN
  Filled 2015-06-13: qty 2

## 2015-06-13 MED ORDER — FENTANYL CITRATE (PF) 100 MCG/2ML IJ SOLN
INTRAMUSCULAR | Status: DC | PRN
  Administered 2015-06-13 (×2): 50 ug via INTRAVENOUS

## 2015-06-13 MED ORDER — VITAMIN C 500 MG PO TABS: 500.0000 mg | ORAL_TABLET | Freq: Every day | ORAL | Status: DC

## 2015-06-13 MED ORDER — CLINDAMYCIN PHOSPHATE 900 MG/50ML IV SOLN
INTRAVENOUS | Status: AC
  Filled 2015-06-13: qty 50

## 2015-06-13 MED ORDER — LACTATED RINGERS IV SOLN
INTRAVENOUS | Status: DC
  Administered 2015-06-13: 14:00:00 via INTRAVENOUS
  Filled 2015-06-13: qty 1000

## 2015-06-13 MED ORDER — OXYCODONE-ACETAMINOPHEN 10-325 MG PO TABS: 1.0000 | ORAL_TABLET | ORAL | Status: DC | PRN

## 2015-06-13 MED ORDER — MIDAZOLAM HCL 5 MG/5ML IJ SOLN
INTRAMUSCULAR | Status: DC | PRN
  Administered 2015-06-13: 2 mg via INTRAVENOUS

## 2015-06-13 MED ORDER — OXYCODONE HCL 5 MG PO TABS
10.0000 mg | ORAL_TABLET | Freq: Once | ORAL | Status: AC | PRN
  Administered 2015-06-13: 10 mg via ORAL
  Filled 2015-06-13: qty 2

## 2015-06-13 SURGICAL SUPPLY — 42 items
BANDAGE ELASTIC 4 VELCRO ST LF (GAUZE/BANDAGES/DRESSINGS) ×6 IMPLANT
BLADE SURG 15 STRL LF DISP TIS (BLADE) ×1 IMPLANT
BLADE SURG 15 STRL SS (BLADE) ×2
BNDG ESMARK 4X9 LF (GAUZE/BANDAGES/DRESSINGS) ×3 IMPLANT
COVER BACK TABLE 60X90IN (DRAPES) ×3 IMPLANT
CUFF TOURN SGL QUICK 18 (TOURNIQUET CUFF) ×3 IMPLANT
DRAPE EXTREMITY T 121X128X90 (DRAPE) ×3 IMPLANT
DRAPE LG THREE QUARTER DISP (DRAPES) ×3 IMPLANT
DRAPE OEC MINIVIEW 54X84 (DRAPES) ×3 IMPLANT
GAUZE XEROFORM 1X8 LF (GAUZE/BANDAGES/DRESSINGS) ×3 IMPLANT
GLOVE BIOGEL PI IND STRL 8.5 (GLOVE) ×1 IMPLANT
GLOVE BIOGEL PI INDICATOR 8.5 (GLOVE) ×2
GLOVE SURG ORTHO 8.0 STRL STRW (GLOVE) ×3 IMPLANT
GOWN STRL REUS W/ TWL LRG LVL3 (GOWN DISPOSABLE) ×1 IMPLANT
GOWN STRL REUS W/TWL LRG LVL3 (GOWN DISPOSABLE) ×2
KIT ROOM TURNOVER WOR (KITS) ×3 IMPLANT
NEEDLE HYPO 25X1 1.5 SAFETY (NEEDLE) ×3 IMPLANT
NS IRRIG 500ML POUR BTL (IV SOLUTION) ×3 IMPLANT
PACK BASIN DAY SURGERY FS (CUSTOM PROCEDURE TRAY) ×3 IMPLANT
PAD CAST 3X4 CTTN HI CHSV (CAST SUPPLIES) ×2 IMPLANT
PAD CAST 4YDX4 CTTN HI CHSV (CAST SUPPLIES) ×1 IMPLANT
PADDING CAST ABS 4INX4YD NS (CAST SUPPLIES) ×4
PADDING CAST ABS COTTON 4X4 ST (CAST SUPPLIES) ×2 IMPLANT
PADDING CAST COTTON 3X4 STRL (CAST SUPPLIES) ×4
PADDING CAST COTTON 4X4 STRL (CAST SUPPLIES) ×2
SLEEVE SCD COMPRESS KNEE MED (MISCELLANEOUS) ×3 IMPLANT
SPLINT FIBERGLASS 3X35 (CAST SUPPLIES) ×3 IMPLANT
SPONGE GAUZE 4X4 12PLY (GAUZE/BANDAGES/DRESSINGS) ×3 IMPLANT
SPONGE GAUZE 4X4 12PLY STER LF (GAUZE/BANDAGES/DRESSINGS) ×3 IMPLANT
STOCKINETTE 4X48 STRL (DRAPES) ×3 IMPLANT
SUT MNCRL AB 3-0 PS2 18 (SUTURE) IMPLANT
SUT PROLENE 3 0 PS 1 (SUTURE) IMPLANT
SUT PROLENE 4 0 PS 2 18 (SUTURE) ×3 IMPLANT
SUT VIC AB 2-0 SH 27 (SUTURE)
SUT VIC AB 2-0 SH 27XBRD (SUTURE) IMPLANT
SYR BULB 3OZ (MISCELLANEOUS) ×3 IMPLANT
SYR CONTROL 10ML LL (SYRINGE) ×6 IMPLANT
TOWEL OR 17X24 6PK STRL BLUE (TOWEL DISPOSABLE) ×3 IMPLANT
TRAY DSU PREP LF (CUSTOM PROCEDURE TRAY) ×3 IMPLANT
TUBE CONNECTING 12'X1/4 (SUCTIONS)
TUBE CONNECTING 12X1/4 (SUCTIONS) IMPLANT
UNDERPAD 30X30 INCONTINENT (UNDERPADS AND DIAPERS) ×3 IMPLANT

## 2015-06-13 NOTE — Anesthesia Preprocedure Evaluation (Signed)
Anesthesia Evaluation  Patient identified by MRN, date of birth, ID band Patient awake    Reviewed: Allergy & Precautions, H&P , NPO status , Patient's Chart, lab work & pertinent test results  Airway Mallampati: II  TM Distance: >3 FB Neck ROM: Full    Dental no notable dental hx.    Pulmonary asthma , former smoker,    Pulmonary exam normal breath sounds clear to auscultation       Cardiovascular negative cardio ROS Normal cardiovascular exam Rhythm:Regular Rate:Normal     Neuro/Psych PSYCHIATRIC DISORDERS Anxiety Depression  Neuromuscular disease negative neurological ROS     GI/Hepatic negative GI ROS, Neg liver ROS,   Endo/Other  negative endocrine ROS  Renal/GU negative Renal ROS  negative genitourinary   Musculoskeletal negative musculoskeletal ROS (+) Arthritis , Fibromyalgia -, narcotic dependent  Abdominal (+) + obese,   Peds negative pediatric ROS (+)  Hematology negative hematology ROS (+)   Anesthesia Other Findings   Reproductive/Obstetrics negative OB ROS                             Anesthesia Physical  Anesthesia Plan  ASA: III  Anesthesia Plan: General   Post-op Pain Management:    Induction: Intravenous  Airway Management Planned: LMA  Additional Equipment:   Intra-op Plan:   Post-operative Plan: Extubation in OR  Informed Consent: I have reviewed the patients History and Physical, chart, labs and discussed the procedure including the risks, benefits and alternatives for the proposed anesthesia with the patient or authorized representative who has indicated his/her understanding and acceptance.   Dental advisory given  Plan Discussed with: CRNA and Anesthesiologist  Anesthesia Plan Comments:         Anesthesia Quick Evaluation

## 2015-06-13 NOTE — H&P (Signed)
Tonya Perry is an 40 y.o. female.   Chief Complaint: LEFT WRIST RETAINED DEEP IMPLANTS HPI: PT WITH LEFT WRIST BURIED PINS PT HERE FOR SCHEDULED PROCEDURE TO REMOVE PINS   Past Medical History  Diagnosis Date  . Depression   . Anxiety   . Asthma   . Arthritis   . Left wrist pain     retained deep implant  . Kienbck's disease     LEFT WRIST  . Iron deficiency anemia   . Fibromyalgia     Past Surgical History  Procedure Laterality Date  . Laparoscopic appendectomy N/A 11/04/2013    Procedure: APPENDECTOMY LAPAROSCOPIC;  Surgeon: Valarie Merino, MD;  Location: [facility];  Service: General;  Laterality: N/A;  . Wrist reconstruction Left 04-15-2015     for Kienbock's disease     Family History  Problem Relation Age of Onset  . Depression Mother   . Diabetes Mother   . Mental illness Mother   . COPD Father   . Mental illness Father   . Hypertension Father   . Hyperlipidemia Father   . Depression Maternal Grandmother   . Mental illness Maternal Grandmother   . Diabetes Maternal Grandfather   . Mental illness Maternal Grandfather   . Cancer Paternal Grandfather   . Mental illness Paternal Grandfather   . Mental illness Sister   . Mental illness Sister    Social History:  reports that she quit smoking about 19 years ago. She has never used smokeless tobacco. She reports that she drinks alcohol. She reports that she does not use illicit drugs.  Allergies:  Allergies  Allergen Reactions  . Azithromycin Diarrhea  . Hydrocodone-Acetaminophen Itching  . Latex Itching  . Other Itching and Other (See Comments)    Peas, Pineapple, Mango, Melons, Cantelope, Watermelons.  Dust mites, trees, grass and pollen.  Causes throat to swell with itching. Tree nuts, causes throat to swell with itching.  . Shellfish Allergy Itching and Swelling  . Voltaren [Diclofenac Sodium] Other (See Comments)    Upset stomach; made pt feel like had to go to bathroom  . Penicillins Rash     Medications Prior to Admission  Medication Sig Dispense Refill  . acetaminophen (TYLENOL) 500 MG tablet Take 500 mg by mouth.    . Brexpiprazole (REXULTI) 1 MG TABS Take 1 tablet by mouth every morning.     . cetirizine (ZYRTEC) 10 MG tablet Take 1 tablet (10 mg total) by mouth at bedtime. (Patient taking differently: Take 10 mg by mouth every morning. ) 90 tablet 3  . clotrimazole (LOTRIMIN) 1 % cream Apply 1 application topically 2 (two) times daily. 113 g 0  . Dextroamphetamine Sulfate 20 MG TABS Take 10 mg by mouth 2 (two) times daily.     . diclofenac sodium (VOLTAREN) 1 % GEL Apply 2 g topically 4 (four) times daily. (Patient taking differently: Apply 2 g topically as needed. ) 100 g 3  . diphenhydrAMINE (BENADRYL) 25 mg capsule Take 25 mg by mouth at bedtime as needed (1 -2 tablets as needed at bedtime). May do 50 mg    . EPIPEN 2-PAK 0.3 MG/0.3ML SOAJ injection Inject 0.3 mg as directed as needed (systemic reaction to seafood allergy).   1  . ferrous sulfate 325 (65 FE) MG tablet Take 325 mg by mouth daily with breakfast.    . fish oil-omega-3 fatty acids 1000 MG capsule Take 1 g by mouth daily.    . fluticasone (FLONASE) 50 MCG/ACT  nasal spray Place 2 sprays into both nostrils daily. (Patient taking differently: Place 2 sprays into both nostrils daily as needed. ) 16 g 11  . gabapentin (NEURONTIN) 400 MG capsule Take 1 capsule (400 mg total) by mouth 4 (four) times daily. (Patient taking differently: Take 400 mg by mouth 3 (three) times daily. ) 360 capsule 0  . ibuprofen (ADVIL,MOTRIN) 200 MG tablet Take 200 mg by mouth every 6 (six) hours as needed.    . metaxalone (SKELAXIN) 800 MG tablet Take 1 tablet (800 mg total) by mouth 3 (three) times daily. 270 tablet 1  . Multiple Vitamins-Minerals (MULTIVITAMIN WITH MINERALS) tablet Take 1 tablet by mouth daily.    Marland Kitchen. oxyCODONE-acetaminophen (ROXICET) 5-325 MG tablet Take 1 tablet by mouth every 6 (six) hours as needed for severe pain. 40  tablet 0  . PROAIR HFA 108 (90 BASE) MCG/ACT inhaler Inhale 2 puffs into the lungs as needed for wheezing (2 puffs as needed every 4-6h as needed for cough/wheeze).   0  . sertraline (ZOLOFT) 100 MG tablet Take 100 mg by mouth 2 (two) times daily.    Marland Kitchen. terbinafine (LAMISIL) 250 MG tablet TAKE 1 TABLET (250 MG TOTAL) BY MOUTH DAILY (Patient taking differently: Take 250 mg by mouth daily. TAKE 1 TABLET (250 MG TOTAL) BY MOUTH DAILY) 90 tablet 0    No results found for this or any previous visit (from the past 48 hour(s)). No results found.  ROS NO RECENT ILLNESSES OR HOSPITALIZATIONS  Blood pressure 125/77, pulse 79, temperature 98.6 F (37 C), temperature source Oral, resp. rate 12, height 5' 3.5" (1.613 m), weight 92.534 kg (204 lb), SpO2 99 %. Physical Exam   General Appearance:  Alert, cooperative, no distress, appears stated age  Head:  Normocephalic, without obvious abnormality, atraumatic  Eyes:  Pupils equal, conjunctiva/corneas clear,         Throat: Lips, mucosa, and tongue normal; teeth and gums normal  Neck: No visible masses     Lungs:   respirations unlabored  Chest Wall:  No tenderness or deformity  Heart:  Regular rate and rhythm,  Abdomen:   Soft, non-tender,         Extremities: LEFT HAND: CAST IN PLACE, WIGGLES THUMB FINGERS WARM WELL PERFUSED GOOD DIGITAL MOTION  Pulses: 2+ and symmetric  Skin: Skin color, texture, turgor normal, no rashes or lesions     Neurologic: Normal    Assessment/Plan LEFT WRIST RETAINED DEEP IMPLANT  LEFT WRIST DEEP IMPLANT, BURIED WIRE EXCISION  R/B/A DISCUSSED WITH PT IN OFFICE.  PT VOICED UNDERSTANDING OF PLAN CONSENT SIGNED DAY OF SURGERY PT SEEN AND EXAMINED PRIOR TO OPERATIVE PROCEDURE/DAY OF SURGERY SITE MARKED. QUESTIONS ANSWERED WILL GO HOME FOLLOWING SURGERY  WE ARE PLANNING SURGERY FOR YOUR UPPER EXTREMITY. THE RISKS AND BENEFITS OF SURGERY INCLUDE BUT NOT LIMITED TO BLEEDING INFECTION, DAMAGE TO NEARBY NERVES  ARTERIES TENDONS, FAILURE OF SURGERY TO ACCOMPLISH ITS INTENDED GOALS, PERSISTENT SYMPTOMS AND NEED FOR FURTHER SURGICAL INTERVENTION. WITH THIS IN MIND WE WILL PROCEED. I HAVE DISCUSSED WITH THE PATIENT THE PRE AND POSTOPERATIVE REGIMEN AND THE DOS AND DON'TS. PT VOICED UNDERSTANDING AND INFORMED CONSENT SIGNED.  Sharma CovertORTMANN,Jerald Hennington W 06/13/2015, 12:52 PM

## 2015-06-13 NOTE — Discharge Instructions (Signed)
KEEP BANDAGE CLEAN AND DRY CALL OFFICE FOR F/U APPT [phone] IN 14 DAYS KEEP HAND ELEVATED ABOVE HEART OK TO APPLY ICE TO OPERATIVE AREA CONTACT OFFICE IF ANY WORSENING PAIN OR CONCERNS.     Post Anesthesia Home Care Instructions  Activity: Get plenty of rest for the remainder of the day. A responsible adult should stay with you for 24 hours following the procedure.  For the next 24 hours, DO NOT: -Drive a car -Advertising copywriterperate machinery -Drink alcoholic beverages -Take any medication unless instructed by your physician -Make any legal decisions or sign important papers.  Meals: Start with liquid foods such as gelatin or soup. Progress to regular foods as tolerated. Avoid greasy, spicy, heavy foods. If nausea and/or vomiting occur, drink only clear liquids until the nausea and/or vomiting subsides. Call your physician if vomiting continues.  Special Instructions/Symptoms: Your throat may feel dry or sore from the anesthesia or the breathing tube placed in your throat during surgery. If this causes discomfort, gargle with warm salt water. The discomfort should disappear within 24 hours.  If you had a scopolamine patch placed behind your ear for the management of post- operative nausea and/or vomiting:  1. The medication in the patch is effective for 72 hours, after which it should be removed.  Wrap patch in a tissue and discard in the trash. Wash hands thoroughly with soap and water. 2. You may remove the patch earlier than 72 hours if you experience unpleasant side effects which may include dry mouth, dizziness or visual disturbances. 3. Avoid touching the patch. Wash your hands with soap and water after contact with the patch.

## 2015-06-13 NOTE — Transfer of Care (Signed)
Last Vitals:  Filed Vitals:   06/13/15 1246  BP: 125/77  Pulse: 79  Temp: 37 C  Resp: 12   Immediate Anesthesia Transfer of Care Note  Patient: Tonya Perry  Procedure(s) Performed: Procedure(s) (LRB): LEFT WRIST DEEP IMPLANT REMOVAL  (Left)  Patient Location: PACU  Anesthesia Type: General  Level of Consciousness: awake, alert  and oriented  Airway & Oxygen Therapy: Patient Spontanous Breathing and Patient connected to nasal cannula oxygen  Post-op Assessment: Report given to PACU RN and Post -op Vital signs reviewed and stable  Post vital signs: Reviewed and stable  Complications: No apparent anesthesia complications

## 2015-06-13 NOTE — Brief Op Note (Signed)
06/13/2015  1:32 PM  PATIENT:  Tonya Perry  40 y.o. female  PRE-OPERATIVE DIAGNOSIS:  left wrist retained deep implant  POST-OPERATIVE DIAGNOSIS:  * No post-op diagnosis entered *  PROCEDURE:  Procedure(s): LEFT WRIST DEEP IMPLANT REMOVAL  (Left)  SURGEON:  Surgeon(s) and Role:    * Bradly BienenstockFred Iniya Matzek, MD - Primary  PHYSICIAN ASSISTANT:   ASSISTANTS: none   ANESTHESIA:   general  EBL:     BLOOD ADMINISTERED:none  DRAINS: none   LOCAL MEDICATIONS USED:  MARCAINE     SPECIMEN:  No Specimen  DISPOSITION OF SPECIMEN:  N/A  COUNTS:  YES  TOURNIQUET:    DICTATION: .Other Dictation: Dictation Number [id number]  PLAN OF CARE: Discharge to home after PACU  PATIENT DISPOSITION:  PACU - hemodynamically stable.   Delay start of Pharmacological VTE agent (>24hrs) due to surgical blood loss or risk of bleeding: not applicable

## 2015-06-13 NOTE — Anesthesia Procedure Notes (Signed)
Procedure Name: LMA Insertion Date/Time: 06/13/2015 2:50 PM Performed by: Norva PavlovALLAWAY, Tonya Arif G Pre-anesthesia Checklist: Patient identified, Emergency Drugs available, Suction available and Patient being monitored Patient Re-evaluated:Patient Re-evaluated prior to inductionOxygen Delivery Method: Circle System Utilized Preoxygenation: Pre-oxygenation with 100% oxygen Intubation Type: IV induction Ventilation: Mask ventilation without difficulty LMA: LMA inserted LMA Size: 4.0 Number of attempts: 1 Airway Equipment and Method: bite block Placement Confirmation: positive ETCO2 Tube secured with: Tape Dental Injury: Teeth and Oropharynx as per pre-operative assessment

## 2015-06-14 ENCOUNTER — Encounter (HOSPITAL_BASED_OUTPATIENT_CLINIC_OR_DEPARTMENT_OTHER): Payer: Self-pay | Admitting: Orthopedic Surgery

## 2015-06-14 NOTE — Anesthesia Postprocedure Evaluation (Signed)
Anesthesia Post Note  Patient: Tonya PhenixLaurie W Perry  Procedure(s) Performed: Procedure(s) (LRB): LEFT WRIST DEEP IMPLANT REMOVAL  (Left)  Patient location during evaluation: PACU Anesthesia Type: General Level of consciousness: awake and alert Pain management: pain level controlled Vital Signs Assessment: post-procedure vital signs reviewed and stable Respiratory status: spontaneous breathing, nonlabored ventilation, respiratory function stable and patient connected to nasal cannula oxygen Cardiovascular status: blood pressure returned to baseline and stable Postop Assessment: no signs of nausea or vomiting Anesthetic complications: no    Last Vitals:  Filed Vitals:   06/13/15 1600 06/13/15 1645  BP: 120/86 119/75  Pulse: 83 67  Temp:  37 C  Resp: 21 18    Last Pain:  Filed Vitals:   06/13/15 1655  PainSc: 3                  [name]

## 2015-06-14 NOTE — Op Note (Signed)
NAME:  Tonya Perry, Tonya Perry            ACCOUNT NO.:  [id number]  MEDICAL RECORD NO.:  [id number]  LOCATION:                                 FACILITY:  PHYSICIAN:  Sharma CovertFred W. Lear Carstens IV, M.D.DATE OF BIRTH:  1976-02-25  DATE OF PROCEDURE:  06/13/2015 DATE OF DISCHARGE:                              OPERATIVE REPORT   POSTOPERATIVE DIAGNOSIS:  Left wrist deep implant retained hardware.  POSTOPERATIVE DIAGNOSIS:  Left wrist deep implant retained hardware.  ATTENDING PHYSICIAN:  Sharma CovertFred W. Shaquilla Kehres, M.D., who was scrubbed and present for Tonya entire procedure.  ASSISTANT SURGEON:  None.  ANESTHESIA:  General via LMA.  PROCEDURE: 1. Removal of buried implants in Tonya left wrist; 2 K-wires. 2. Radiographs, 2 views left wrist.  RADIOGRAPHS INTERPRETATION:  AP and lateral views of wrist did show Tonya internal fixation removed and good alignment of Tonya radiocarpal midcarpal joint interval.  SURGICAL INDICATIONS:  Tonya Perry is a right-hand-dominant female who had Tonya staged procedure in terms of vascularized bone grafting to Tonya lunate with Tonya scaphocapitate wires placed.  Tonya Perry is here for a removal of Tonya deep buried implants.  Risks, benefits, and alternatives were discussed in detail with Tonya Perry.  Signed informed consent was obtained.  Risks include, but not limited to bleeding, infection, damage to nearby nerves, arteries, or tendons; loss of motion of wrist and digits, incomplete relief of symptoms, and need for further surgical intervention.  DESCRIPTION OF PROCEDURE:  Tonya Perry was properly identified in Tonya preoperative holding area, marked with a permanent marker on Tonya left wrist to indicate Tonya correct operative site.  Tonya Perry was then brought back to Tonya operating room, placed supine on Tonya anesthesia room table.  General anesthesia was administered.  Tonya Perry tolerated this well.  A well-padded tourniquet was placed on Tonya left brachium and sealed with a  1000-drape.  Left upper extremity was then prepped and draped in normal sterile fashion.  Time-out was called.  Tonya correct site was identified, and procedure then begun.  Attention then turned to Tonya left wrist.  Limb was elevated and tourniquet insufflated.  A small skin incision was then made directly over Tonya wires.  Deep dissection carried down to Tonya subcutaneous tissue all Tonya way down to Tonya joint level where Tonya 2 K-wires were then identified and removed without any complicating features.  Tonya wound was then irrigated.  Tonya skin was then closed using horizontal mattress Prolene sutures.  Final radiographs were obtained.  Xeroform dressing and sterile compressive bandage were then applied; 10 mL of 0.25% Marcaine was infiltrated locally.  Tonya Perry was then placed in a well-padded thumb spica splint, extubated, and taken to Tonya recovery room in good condition.  POSTOPERATIVE PLAN:  Tonya Perry will be discharged home, seen back in Tonya office in approximately 2 weeks for wound check, suture removal, x- rays, and then a thumb spica brace and begin an outpatient therapy regimen.  Radiographs at each visit.     Madelynn DoneFred W. Chun Sellen IV, M.D.     FWO/MEDQ  D:  06/13/2015  T:  06/13/2015  Job:  [id number]

## 2015-06-19 ENCOUNTER — Encounter: Payer: Self-pay | Admitting: Family Medicine

## 2015-06-25 ENCOUNTER — Other Ambulatory Visit: Payer: Self-pay | Admitting: Family Medicine

## 2015-07-15 ENCOUNTER — Other Ambulatory Visit: Payer: Self-pay | Admitting: Family Medicine

## 2015-07-15 ENCOUNTER — Encounter: Payer: Self-pay | Admitting: Family Medicine

## 2015-07-24 ENCOUNTER — Encounter: Payer: Self-pay | Admitting: Family Medicine

## 2015-07-27 ENCOUNTER — Other Ambulatory Visit: Payer: Self-pay | Admitting: Family Medicine

## 2015-08-01 ENCOUNTER — Encounter: Payer: Self-pay | Admitting: Family Medicine

## 2015-08-01 ENCOUNTER — Ambulatory Visit (INDEPENDENT_AMBULATORY_CARE_PROVIDER_SITE_OTHER): Payer: BLUE CROSS/BLUE SHIELD | Admitting: Family Medicine

## 2015-08-01 VITALS — BP 121/81 | HR 81 | Temp 98.8°F | Resp 16 | Ht 64.0 in | Wt 214.0 lb

## 2015-08-01 DIAGNOSIS — M5137 Other intervertebral disc degeneration, lumbosacral region: Secondary | ICD-10-CM | POA: Diagnosis not present

## 2015-08-01 DIAGNOSIS — F32A Depression, unspecified: Secondary | ICD-10-CM

## 2015-08-01 DIAGNOSIS — M51379 Other intervertebral disc degeneration, lumbosacral region without mention of lumbar back pain or lower extremity pain: Secondary | ICD-10-CM

## 2015-08-01 DIAGNOSIS — B353 Tinea pedis: Secondary | ICD-10-CM

## 2015-08-01 DIAGNOSIS — F329 Major depressive disorder, single episode, unspecified: Secondary | ICD-10-CM

## 2015-08-01 DIAGNOSIS — N911 Secondary amenorrhea: Secondary | ICD-10-CM

## 2015-08-01 DIAGNOSIS — M92219 Osteochondrosis (juvenile) of carpal lunate [Kienbock], unspecified hand: Secondary | ICD-10-CM | POA: Insufficient documentation

## 2015-08-01 DIAGNOSIS — Z5181 Encounter for therapeutic drug level monitoring: Secondary | ICD-10-CM | POA: Diagnosis not present

## 2015-08-01 DIAGNOSIS — M797 Fibromyalgia: Secondary | ICD-10-CM

## 2015-08-01 DIAGNOSIS — M92212 Osteochondrosis (juvenile) of carpal lunate [Kienbock], left hand: Secondary | ICD-10-CM

## 2015-08-01 DIAGNOSIS — M1990 Unspecified osteoarthritis, unspecified site: Secondary | ICD-10-CM

## 2015-08-01 DIAGNOSIS — T50905A Adverse effect of unspecified drugs, medicaments and biological substances, initial encounter: Secondary | ICD-10-CM

## 2015-08-01 LAB — CBC
HEMATOCRIT: 40.7 % (ref 35.0–45.0)
Hemoglobin: 13.4 g/dL (ref 11.7–15.5)
MCH: 30.7 pg (ref 27.0–33.0)
MCHC: 32.9 g/dL (ref 32.0–36.0)
MCV: 93.3 fL (ref 80.0–100.0)
MPV: 8.8 fL (ref 7.5–12.5)
Platelets: 365 10*3/uL (ref 140–400)
RBC: 4.36 MIL/uL (ref 3.80–5.10)
RDW: 13.7 % (ref 11.0–15.0)
WBC: 9.3 10*3/uL (ref 3.8–10.8)

## 2015-08-01 LAB — COMPREHENSIVE METABOLIC PANEL
ALT: 14 U/L (ref 6–29)
AST: 16 U/L (ref 10–30)
Albumin: 4.4 g/dL (ref 3.6–5.1)
Alkaline Phosphatase: 65 U/L (ref 33–115)
BUN: 9 mg/dL (ref 7–25)
CHLORIDE: 101 mmol/L (ref 98–110)
CO2: 26 mmol/L (ref 20–31)
CREATININE: 0.77 mg/dL (ref 0.50–1.10)
Calcium: 9.7 mg/dL (ref 8.6–10.2)
Glucose, Bld: 80 mg/dL (ref 65–99)
Potassium: 4.1 mmol/L (ref 3.5–5.3)
SODIUM: 140 mmol/L (ref 135–146)
TOTAL PROTEIN: 7.1 g/dL (ref 6.1–8.1)
Total Bilirubin: 0.3 mg/dL (ref 0.2–1.2)

## 2015-08-01 LAB — THYROID PANEL WITH TSH
Free Thyroxine Index: 1.7 (ref 1.4–3.8)
T3 UPTAKE: 30 % (ref 22–35)
T4, Total: 5.6 ug/dL (ref 4.5–12.0)
TSH: 0.93 mIU/L

## 2015-08-01 LAB — C-REACTIVE PROTEIN: CRP: <0.5 mg/dL (ref <0.60)

## 2015-08-01 LAB — FERRITIN: FERRITIN: 104 ng/mL (ref 10–154)

## 2015-08-01 LAB — HEPATITIS C ANTIBODY: HCV Ab: NEGATIVE

## 2015-08-01 LAB — MAGNESIUM: MAGNESIUM: 2 mg/dL (ref 1.5–2.5)

## 2015-08-01 LAB — CK: Total CK: 81 U/L (ref 7–177)

## 2015-08-01 NOTE — Progress Notes (Signed)
   Subjective:    Patient ID: Tonya Perry, female    DOB: [birth year], 40 y.o.   Tonya Perry: [id number]  HPI Skelaxin Sertraline GMA for her fibromyalgia [place] - still having pain in her left wrist, she did have her right wrist t Depression has been bad, she tried to lower herself off percocet to fast and she had withdrawal symptoms and her depression worsened so now she is taking 1 tab a day for several weeks - she is often taking 1 tab in the evening.  Is seeing Tonya Perry for Vraylar for mood which hasn't seem to make a difference. Tonya Perry [facility] at [facility] - she started going there when she was [age] yol She is having worse headaches and fatigue. She was wondering if she could be going through menopause due to her sxs She has an IUD in so doesn't have a period. Does have soreness underneath both axilla and has hot flashes long standing. nGHer mother had menopausea at 40 yo.  Still walking w/ her mom. She admits to eating more carbs than she should and bothered by her weight gain. 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 Still in the hearing stage for her disability Review of Systems     Objective:  BP 121/81 mmHg  Pulse 81  Temp(Src) 98.8 F (37.1 C)  Resp 16  Ht 5\' 4"  (1.626 m)  Wt 214 lb (97.07 kg)  BMI 36.72 kg/m2  Physical Exam        Assessment & Plan:

## 2015-08-01 NOTE — Patient Instructions (Addendum)
[facility] - [place] [facility] - all are great - hear really good things about Dr. Noe Gens and Theodosia Quay at Dr. Carie Caddy office     IF you received an x-ray today, you will receive an invoice from [facility]. Please contact [facility] at [phone] with questions or concerns regarding your invoice.   IF you received labwork today, you will receive an invoice from [employer]. Please contact [facility] at [phone] with questions or concerns regarding your invoice.   Our billing staff will not be able to assist you with questions regarding bills from these companies.  You will be contacted with the lab results as soon as they are available. The fastest way to get your results is to activate your My Chart account. Instructions are located on the last page of this paperwork. If you have not heard from [place] regarding the results in 2 weeks, please contact this office.    Myofascial Pain Syndrome and Fibromyalgia Myofascial pain syndrome and fibromyalgia are both pain disorders. This pain may be felt mainly in your muscles.   Myofascial pain syndrome:  Always has trigger points or tender points in the muscle that will cause pain when pressed. The pain may come and go.  Usually affects your neck, upper back, and shoulder areas. The pain often radiates into your arms and hands.  Fibromyalgia:  Has muscle pains and tenderness that come and go.  Is often associated with fatigue and sleep disturbances.  Has trigger points.  Tends to be long-lasting (chronic), but is not life-threatening. Fibromyalgia and myofascial pain are not the same. However, they often occur together. If you have both conditions, each can make the other worse. Both are common and can cause enough pain and fatigue to make day-to-day activities difficult.  CAUSES  The exact causes of fibromyalgia and  myofascial pain are not known. People with certain gene types may be more likely to develop fibromyalgia. Some factors can be triggers for both conditions, such as:   Spine disorders.  Arthritis.  Severe injury (trauma) and other physical stressors.  Being under a lot of stress.  A medical illness. SIGNS AND SYMPTOMS  Fibromyalgia The main symptom of fibromyalgia is widespread pain and tenderness in your muscles. This can vary over time. Pain is sometimes described as stabbing, shooting, or burning. You may have tingling or numbness, too. You may also have sleep problems and fatigue. You may wake up feeling tired and groggy (fibro fog). Other symptoms may include:   Bowel and bladder problems.  Headaches.  Visual problems.  Problems with odors and noises.  Depression or mood changes.  Painful menstrual periods (dysmenorrhea).  Dry skin or eyes. Myofascial pain syndrome Symptoms of myofascial pain syndrome include:   Tight, ropy bands of muscle.   Uncomfortable sensations in muscular areas, such as:  Aching.  Cramping.  Burning.  Numbness.  Tingling.   Muscle weakness.  Trouble moving certain muscles freely (range of motion). DIAGNOSIS  There are no specific tests to diagnose fibromyalgia or myofascial pain syndrome. Both can be hard to diagnose because their symptoms are common in many other conditions. Your health care provider may suspect one or both of these conditions based on your symptoms and medical history. Your health care provider will also do a physical exam.  The key to diagnosing fibromyalgia is having pain, fatigue, and other symptoms for more than three months that cannot be explained by another  condition.  The key to diagnosing myofascial pain syndrome is finding trigger points in muscles that are tender and cause pain elsewhere in your body (referred pain). TREATMENT  Treating fibromyalgia and myofascial pain often requires a team of health care  providers. This usually starts with your primary provider and a physical therapist. You may also find it helpful to work with alternative health care providers, such as massage therapists or acupuncturists. Treatment for fibromyalgia may include medicines. This may include nonsteroidal anti-inflammatory drugs (NSAIDs), along with other medicines.  Treatment for myofascial pain may also include:  NSAIDs.  Cooling and stretching of muscles.  Trigger point injections.  Sound wave (ultrasound) treatments to stimulate muscles. HOME CARE INSTRUCTIONS   Take medicines only as directed by your health care provider.  Exercise as directed by your health care provider or physical therapist.  Try to avoid stressful situations.  Practice relaxation techniques to control your stress. You may want to try:  Biofeedback.  Visual imagery.  Hypnosis.  Muscle relaxation.  Yoga.  Meditation.  Talk to your health care provider about alternative treatments, such as acupuncture or massage treatment.  Maintain a healthy lifestyle. This includes eating a healthy diet and getting enough sleep.  Consider joining a support group.  Do not do activities that stress or strain your muscles. That includes repetitive motions and heavy lifting. SEEK MEDICAL CARE IF:   You have new symptoms.  Your symptoms get worse.  You have side effects from your medicines.  You have trouble sleeping.  Your condition is causing depression or anxiety. FOR MORE INFORMATION   National Fibromyalgia Association: http://www.fmaware.orgwww.fmaware.org  Arthritis Foundation: http://www.arthritis.orgwww.arthritis.org  American Chronic Pain Association: michaeledo.comhttp://www.theacpa.org/condition/myofascial-painwww.CandyDash.co.zatheacpa.org/condition/myofascial-pain   This information is not intended to replace advice given to you by your health care provider. Make sure you discuss any questions you have with your health care provider.    Document Released: 03/23/2005 Document Revised: 04/13/2014 Document Reviewed: 12/27/2013 Elsevier Interactive Patient Education [employer]2016 Elsevier Inc.

## 2015-08-02 LAB — FSH/LH
FSH: 6.4 m[IU]/mL
LH: 7.3 m[IU]/mL

## 2015-08-02 LAB — SEDIMENTATION RATE: Sed Rate: 3 mm/hr (ref 0–20)

## 2015-08-14 ENCOUNTER — Encounter: Payer: Self-pay | Admitting: Family Medicine

## 2015-08-16 ENCOUNTER — Encounter: Payer: Self-pay | Admitting: Podiatry

## 2015-08-16 ENCOUNTER — Ambulatory Visit (INDEPENDENT_AMBULATORY_CARE_PROVIDER_SITE_OTHER): Payer: BLUE CROSS/BLUE SHIELD | Admitting: Podiatry

## 2015-08-16 VITALS — BP 115/80 | HR 91 | Resp 16

## 2015-08-16 DIAGNOSIS — L309 Dermatitis, unspecified: Secondary | ICD-10-CM

## 2015-08-16 NOTE — Progress Notes (Signed)
Subjective:     Patient ID: Tonya Perry, female   DOB: Mar 03, 1976, 40 y.o.   MRN: [id number]  HPI patient states her foot feels better from what we did before but her heels on both feet are cracked and she's been on Lamisil for almost a year   Review of Systems     Objective:   Physical Exam Neurovascular status intact muscle strength adequate with patient noted to have cracking of the plantar posterior aspect of the heel region bilateral that's localized with no drainage    Assessment:     Probability for dry skin leading to cracked type tissue    Plan:     Advised this patient on conservative care consisting of Vaseline under occlusion and utilizing reapplied a [place] cream. Reappoint as needed

## 2015-10-11 ENCOUNTER — Other Ambulatory Visit: Payer: Self-pay | Admitting: Family Medicine

## 2015-10-24 ENCOUNTER — Encounter: Payer: Self-pay | Admitting: Podiatry

## 2015-10-24 ENCOUNTER — Ambulatory Visit (INDEPENDENT_AMBULATORY_CARE_PROVIDER_SITE_OTHER): Payer: BLUE CROSS/BLUE SHIELD

## 2015-10-24 ENCOUNTER — Ambulatory Visit (INDEPENDENT_AMBULATORY_CARE_PROVIDER_SITE_OTHER): Payer: BLUE CROSS/BLUE SHIELD | Admitting: Podiatry

## 2015-10-24 VITALS — BP 124/84 | HR 108 | Resp 16

## 2015-10-24 DIAGNOSIS — M79672 Pain in left foot: Secondary | ICD-10-CM | POA: Diagnosis not present

## 2015-10-24 DIAGNOSIS — M779 Enthesopathy, unspecified: Secondary | ICD-10-CM

## 2015-10-24 DIAGNOSIS — M21619 Bunion of unspecified foot: Secondary | ICD-10-CM

## 2015-10-24 DIAGNOSIS — M8430XA Stress fracture, unspecified site, initial encounter for fracture: Secondary | ICD-10-CM | POA: Diagnosis not present

## 2015-10-24 MED ORDER — TRIAMCINOLONE ACETONIDE 10 MG/ML IJ SUSP
10.0000 mg | Freq: Once | INTRAMUSCULAR | Status: AC
  Administered 2015-10-24: 10 mg

## 2015-10-24 NOTE — Progress Notes (Signed)
Subjective:     Patient ID: Tonya Perry, female   DOB: 24-Nov-1975, 40 y.o.   MRN: [id number]  HPI patient presents with discomfort of the mid arch left along with a lot of discomfort on the dorsum of the left foot that's making ambulation uncomfortable. Does not remember specific injury to the area and also still complains about bunion deformity right over left   Review of Systems     Objective:   Physical Exam Neurovascular status intact muscle strength adequate with inflammation plantar arch left and inflammation of the dorsal foot left with mild swelling of the dorsal metatarsal area. Structural bunion deformity noted bilateral    Assessment:     Probable fasciitis condition with mild tendinitis or possible stress fracture left foot along with structural bunion deformity bilateral    Plan:     H&P conditions reviewed and today I went ahead and injected the plantar arch left 3 Milligan Kenalog 5 mill grams Xylocaine and advised on reduced activity and went ahead and reviewed x-rays concerning stress fracture. Patient be seen back 3 weeks or earlier if needed  X-ray report was negative for stress fracture with mild depression of the arch and bunion deformity noted left

## 2015-11-13 ENCOUNTER — Other Ambulatory Visit: Payer: Self-pay | Admitting: Family Medicine

## 2015-11-14 ENCOUNTER — Ambulatory Visit: Payer: BLUE CROSS/BLUE SHIELD | Admitting: Podiatry

## 2015-11-22 ENCOUNTER — Ambulatory Visit: Payer: BLUE CROSS/BLUE SHIELD | Admitting: Podiatry

## 2015-12-05 ENCOUNTER — Encounter: Payer: Self-pay | Admitting: Family Medicine

## 2015-12-05 ENCOUNTER — Ambulatory Visit (INDEPENDENT_AMBULATORY_CARE_PROVIDER_SITE_OTHER): Payer: BLUE CROSS/BLUE SHIELD | Admitting: Family Medicine

## 2015-12-05 VITALS — HR 110 | Temp 98.5°F | Resp 18

## 2015-12-05 DIAGNOSIS — F329 Major depressive disorder, single episode, unspecified: Secondary | ICD-10-CM

## 2015-12-05 DIAGNOSIS — M797 Fibromyalgia: Secondary | ICD-10-CM

## 2015-12-05 DIAGNOSIS — M1990 Unspecified osteoarthritis, unspecified site: Secondary | ICD-10-CM | POA: Diagnosis not present

## 2015-12-05 DIAGNOSIS — F32A Depression, unspecified: Secondary | ICD-10-CM

## 2015-12-05 DIAGNOSIS — Z23 Encounter for immunization: Secondary | ICD-10-CM

## 2015-12-05 MED ORDER — SERTRALINE HCL 100 MG PO TABS: 150.0000 mg | ORAL_TABLET | Freq: Every day | ORAL | 1 refills | Status: DC

## 2015-12-05 MED ORDER — GABAPENTIN 400 MG PO CAPS: ORAL_CAPSULE | ORAL | 1 refills | Status: DC

## 2015-12-05 NOTE — Patient Instructions (Addendum)
IF you received an x-ray today, you will receive an invoice from [facility]. Please contact [facility] at [phone] with questions or concerns regarding your invoice.   IF you received labwork today, you will receive an invoice from [employer]. Please contact [facility] at [phone] with questions or concerns regarding your invoice.   Our billing staff will not be able to assist you with questions regarding bills from these companies.  You will be contacted with the lab results as soon as they are available. The fastest way to get your results is to activate your My Chart account. Instructions are located on the last page of this paperwork. If you have not heard from [place] regarding the results in 2 weeks, please contact this office.     Chronic Fatigue Syndrome Chronic fatigue syndrome (CFS) is a condition in which there is lasting, extreme tiredness (fatigue) that does not improve with rest. CFS affects women up to four times more often than men. If you have CFS, fatigue and other symptoms can make it hard for you to get through your day. There is no treatment or cure. You will need to work closely with your health care provider to come up with a treatment plan that works for you. CAUSES  No one knows what causes CFS. It may be triggered by a flu-like illness or by mono. Other triggers may include:  An abnormal immune system.  Low blood pressure.  Poor diet.  Physical or emotional stress. SIGNS AND SYMPTOMS The main symptom is fatigue that lasts all day, especially after physical or mental stress. Other common symptoms include:  An extreme loss of energy with no obvious cause.  Muscle or joint soreness.  Severe weakness.  Frequent headaches.  Fever.  Sore throat.  Swollen lymph glands.  Sleep is not refreshing.  Loss of concentration or memory. Less common symptoms may include:  Chills.  Night sweats.  Tingling  or numbness.  Blurred vision.  Dizziness.  Sensitivity to noise or odors.  Mood swings.  Anxiety, panic attacks, and depression. Your symptoms may come and go, or you may have them all the time. DIAGNOSIS  There are no tests that can help health care providers diagnose CFS. It may take a long time for you to get a correct diagnosis. Your health care provider may need to do a number of tests to rule out other conditions that could be causing your symptoms. You may be diagnosed with CFS if:  You have fatigue that has lasted for at least six months.  Your fatigue is not relieved by rest.  Your fatigue is not caused by another condition.  Your fatigue is severe enough to interfere with work and daily activities.  You have at least four common symptoms of CFS. TREATMENT  There is no cure for CFS at this time. The condition affects everyone differently. You will need to work with your health care provider to find the best treatment for your symptoms. Treatment may include:  Improving sleep with a regular bedtime routine.  Avoiding caffeine, alcohol, and tobacco.  Doing light exercise and stretching during the day.  Taking medicine to help you sleep.  Taking over-the-counter medicines to relieve joint or muscle pain.  Learning and practicing relaxation techniques.  Using memory aids or doing brain teasers to improve memory and concentration.  Seeing a mental health professional to evaluate and treat depression, if necessary.  Trying massage therapy, acupuncture, and movement exercises, like yoga  or tai [place] Work closely with your health care provider to follow your treatment plan at home. You may need to make major lifestyle changes. If treatment does not seem to help, get a second opinion. You may get help from many health care providers, including doctors, mental health specialists, physical therapists, and rehabilitation therapists. Having the  support of friends and loved ones is also important. SEEK MEDICAL CARE IF:  Your symptoms are not responding to treatment.  You are having strong feelings of anger, guilt, anxiety, or depression.   This information is not intended to replace advice given to you by your health care provider. Make sure you discuss any questions you have with your health care provider.   Document Released: 04/30/2004 Document Revised: 04/13/2014 Document Reviewed: 02/10/2013 Elsevier Interactive Patient Education [employer].

## 2015-12-05 NOTE — Progress Notes (Signed)
Subjective:    Patient ID: Tonya Perry, female    DOB: 19-Jul-1975, 40 y.o.   MRN: [id number] Chief Complaint  Patient presents with  . Follow-up  . Immunizations    flu vaccine    HPI     Tonya Perry  Is a 40 yo woman here to follow-up on her chronic medical problems. Psych is Tamela OddiJo Hughes Has been on current med regimen about 3 1/2 year  agreed to see other peple, she is still living with her husband and seeing someone esle duaghter doing ok - is doing better with talking alittle - acting old. 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  Feeling very depressed. Since she has had her left wrist surgery and had to go off of the oxycodone - her psychiatrist has not adjusted her medication or won't go up on there stimulant.  She is wondering if she has a tolerance to all f her medication. [facility] but ultimatley decided that she didn't want to be on opioids chronically She lowered her skelaxin from 3 pills a day to 2 pills a day and didn't notice anything.  She is thinking she might not notice any change in her sxs if she stops the skelaxin entirely She feels like she is less empathetic and loss sexual pleasure on the zoloft.  She has started to go for walks - often walks with her mom.  Will walk for about an hour. [facility] but it still hurts.  Did integrative therapies prior  Disability - she has a hearing likely in Dec 2018 - 16 mos - she needs to get disabillity she would be able to get divorced.  She did get back in tough with vocational rehab to see if they can ehlp her find a part-time job.   Taken women'ds vit, Vit D and Vit C, B12, and fish oil Having some diarrhea Sherrine MaplesGlenn is maiden last name She has been on dextroamphetamine 10mg  since .  [facility] 12/05/2015 08/01/2015 05/09/2015 02/07/2015 01/24/2015  Decreased Interest 0 2 1 3 3   Down, Depressed, Hopeless 0 3 1 3 3   PHQ - 2 Score 0 5 2 6 6   Altered sleeping - 2 1 1 1   Tired,  decreased energy - 3 1 3 3   Change in appetite - 2 0 - 3  Feeling bad or failure about yourself  - 2 1 1 1   Trouble concentrating - 3 3 3 3   Moving slowly or fidgety/restless - 2 0 0 0  Suicidal thoughts - 0 0 0 0  PHQ-9 Score - 19 8 14 17   Difficult doing work/chores - - - - -     Past Medical History:  Diagnosis Date  . Anxiety   . Arthritis   . Asthma   . Depression   . Fibromyalgia   . Iron deficiency anemia   . Kienbck's disease    LEFT WRIST  . Left wrist pain    retained deep implant   Past Surgical History:  Procedure Laterality Date  . LAPAROSCOPIC APPENDECTOMY N/A 11/04/2013   Procedure: APPENDECTOMY LAPAROSCOPIC;  Surgeon: Valarie MerinoMatthew B Martin, MD;  Location: [facility];  Service: General;  Laterality: N/A;  . REMOVAL OF IMPLANT Left 06/13/2015   Procedure: LEFT WRIST DEEP IMPLANT REMOVAL ;  Surgeon: Bradly BienenstockFred Ortmann, MD;  [facility];  Service: Orthopedics;  Laterality: Left;  . WRIST RECONSTRUCTION Left 04-15-2015    for Kienbock's  disease    Current Outpatient Prescriptions on File Prior to Visit  Medication Sig Dispense Refill  . acetaminophen (TYLENOL) 500 MG tablet Take 500 mg by mouth.    . Cariprazine HCl (VRAYLAR) 4.5 MG CAPS Take by mouth.    . cetirizine (ZYRTEC) 10 MG tablet Take 1 tablet (10 mg total) by mouth at bedtime. (Patient taking differently: Take 10 mg by mouth every morning. ) 90 tablet 3  . EPIPEN 2-PAK 0.3 MG/0.3ML SOAJ injection Inject 0.3 mg as directed as needed (systemic reaction to seafood allergy).   1  . ferrous sulfate 325 (65 FE) MG tablet Take 325 mg by mouth daily with breakfast.    . fish oil-omega-3 fatty acids 1000 MG capsule Take 1 g by mouth daily.    . fluticasone (FLONASE) 50 MCG/ACT nasal spray Place 2 sprays into both nostrils daily. (Patient taking differently: Place 2 sprays into both nostrils daily as needed. ) 16 g 11  . ibuprofen (ADVIL,MOTRIN) 200 MG tablet Take 200 mg by mouth every 6 (six) hours as  needed.    . Multiple Vitamins-Minerals (MULTIVITAMIN WITH MINERALS) tablet Take 1 tablet by mouth daily.    Marland Kitchen PROAIR HFA 108 (90 BASE) MCG/ACT inhaler Inhale 2 puffs into the lungs as needed for wheezing (2 puffs as needed every 4-6h as needed for cough/wheeze).   0  . vitamin C (ASCORBIC ACID) 500 MG tablet Take 1 tablet (500 mg total) by mouth daily. 50 tablet 0  . diclofenac sodium (VOLTAREN) 1 % GEL Apply 2 g topically 4 (four) times daily. (Patient not taking: Reported on 12/05/2015) 100 g 3   No current facility-administered medications on file prior to visit.    Allergies  Allergen Reactions  . Azithromycin Diarrhea  . Hydrocodone-Acetaminophen Itching  . Latex Itching  . Other Itching and Other (See Comments)    Peas, Pineapple, Mango, Melons, Cantelope, Watermelons.  Dust mites, trees, grass and pollen.  Causes throat to swell with itching. Tree nuts, causes throat to swell with itching.  . Shellfish Allergy Itching and Swelling  . Voltaren [Diclofenac Sodium] Other (See Comments)    Upset stomach; made pt feel like had to go to bathroom  . Penicillins Rash   Family History  Problem Relation Age of Onset  . Depression Mother   . Diabetes Mother   . Mental illness Mother   . COPD Father   . Mental illness Father   . Hypertension Father   . Hyperlipidemia Father   . Depression Maternal Grandmother   . Mental illness Maternal Grandmother   . Diabetes Maternal Grandfather   . Mental illness Maternal Grandfather   . Cancer Paternal Grandfather   . Mental illness Paternal Grandfather   . Mental illness Sister   . Mental illness Sister    Social History   Social History  . Marital status: Married    Spouse name: [name]  . Number of children: 1  . Years of education: Assoc   Occupational History  .      not employed   Social History Main Topics  . Smoking status: Former Smoker    Years: 5.00    Quit date: 04/06/1996  . Smokeless tobacco: Never Used  . Alcohol use  0.0 oz/week     Comment: occas.  . Drug use: No  . Sexual activity: Not Asked     Comment: 2015-- Placement Mirena IUD   Other Topics Concern  . None   Social History Narrative  Patient consumes 4-5 cups of caffeine daily.    Review of Systems  Constitutional: Positive for activity change and fatigue. Negative for chills and fever.  Gastrointestinal: Positive for diarrhea and nausea. Negative for abdominal pain, constipation and vomiting.  Genitourinary: Negative for decreased urine volume, difficulty urinating, frequency and urgency.  Musculoskeletal: Positive for arthralgias, back pain, joint swelling and myalgias. Negative for gait problem.  Neurological: Positive for weakness, light-headedness, numbness and headaches.  Hematological: Negative for adenopathy.  Psychiatric/Behavioral: Positive for decreased concentration and dysphoric mood. Negative for self-injury. The patient is nervous/anxious.        Objective:   Physical Exam  Constitutional: She is oriented to person, place, and time. She appears well-developed and well-nourished. No distress.  HENT:  Head: Normocephalic and atraumatic.  Right Ear: External ear normal.  Eyes: Conjunctivae are normal. No scleral icterus.  Pulmonary/Chest: Effort normal.  Neurological: She is alert and oriented to person, place, and time.  Skin: Skin is warm and dry. She is not diaphoretic. No erythema.  Psychiatric: She has a normal mood and affect. Her behavior is normal.   Pulse (!) 110   Temp 98.5 F (36.9 C) (Oral)   Resp 18   SpO2 98%      Assessment & Plan:   Over 40 min spent in face-to-face evaluation of and consultation with patient and coordination of care.  Over 50% of this time was spent counseling this patient.   1. Flu vaccine need   2. Arthritis   3. Fibromyalgia - working with vocational rehab to try to get job placement or distability  4. Depression - pt seeing psychiatry Tamela Oddi at [facility]- pt wants to  consider changing up adderal as she thinks she is becoming tolerant - would like to try a different med - will call her psychiatrist to see what they think about this     Orders Placed This Encounter  Procedures  . Flu Vaccine QUAD 36+ mos IM  . Care order/instruction:    AVS printed - let patient go!    Meds ordered this encounter  Medications  . dextroamphetamine (DEXTROSTAT) 10 MG tablet    Sig: Take 10 mg by mouth 3 (three) times daily as needed.  . sertraline (ZOLOFT) 100 MG tablet    Sig: Take 1.5 tablets (150 mg total) by mouth at bedtime.    Dispense:  135 tablet    Refill:  1  . gabapentin (NEURONTIN) 400 MG capsule    Sig: Take 1 tab po qafternoon and 2 tabs po qhs    Dispense:  270 capsule    Refill:  1   Over 40 min spent in face-to-face evaluation of and consultation with patient and coordination of care.  Over 50% of this time was spent counseling this patient.   [name], M.D.  [facility] [address] [place], [place] [address] [phone] phone [phone] fax  12/09/15 11:56 AM

## 2016-02-20 ENCOUNTER — Ambulatory Visit: Payer: Self-pay | Admitting: Family Medicine

## 2016-02-25 ENCOUNTER — Telehealth: Payer: Self-pay

## 2016-02-25 ENCOUNTER — Ambulatory Visit (INDEPENDENT_AMBULATORY_CARE_PROVIDER_SITE_OTHER): Payer: BLUE CROSS/BLUE SHIELD | Admitting: Family Medicine

## 2016-02-25 VITALS — BP 130/80 | HR 115 | Temp 98.6°F | Resp 17 | Ht 63.5 in | Wt 215.0 lb

## 2016-02-25 DIAGNOSIS — F332 Major depressive disorder, recurrent severe without psychotic features: Secondary | ICD-10-CM | POA: Diagnosis not present

## 2016-02-25 DIAGNOSIS — Z79899 Other long term (current) drug therapy: Secondary | ICD-10-CM

## 2016-02-25 DIAGNOSIS — M25562 Pain in left knee: Secondary | ICD-10-CM

## 2016-02-25 DIAGNOSIS — G894 Chronic pain syndrome: Secondary | ICD-10-CM

## 2016-02-25 DIAGNOSIS — M797 Fibromyalgia: Secondary | ICD-10-CM

## 2016-02-25 DIAGNOSIS — M25561 Pain in right knee: Secondary | ICD-10-CM | POA: Diagnosis not present

## 2016-02-25 DIAGNOSIS — G8929 Other chronic pain: Secondary | ICD-10-CM

## 2016-02-25 DIAGNOSIS — J069 Acute upper respiratory infection, unspecified: Secondary | ICD-10-CM

## 2016-02-25 MED ORDER — ALBUTEROL SULFATE HFA 108 (90 BASE) MCG/ACT IN AERS: 2.0000 | INHALATION_SPRAY | RESPIRATORY_TRACT | 5 refills | Status: DC | PRN

## 2016-02-25 NOTE — Progress Notes (Addendum)
Subjective:  By signing my name below, I, Tonya Perry, attest that this documentation has been prepared under the direction and in the presence of Tonya SorensonEva Danai Gotto, MD Electronically Signed: Charline BillsEssence Perry, ED Scribe 02/25/2016 at 8:53 AM.   Patient ID: Tonya Perry, female    DOB: [birth year], 40 y.o.   MRN: [id number] Chief Complaint  Patient presents with  . Follow-up    depression    HPI HPI Comments: Tonya Perry is a 40 y.o. female, with a h/o depression and anxiety, who presents [facility] the [facility] for follow-up for depression.  Pt states that her psychiatrist Starling MannsJoe Hughes at [facility], who she sees every 2 months, tapered her off Zoloft. Pt states that she completely stopped Zoloft 2 days ago and started mirtazapine 1 month ago which she states helps with sleep. Pt is still taking Gabapentin for fibromyalgia. She states that she walks ~1 hour with her mother 2-3 days/week. She occasionally has to use her albuterol inhaler while walking in the winter months. Pt is still taking dextrostat 3 times a day which she states really helps.   Past Medical History:  Diagnosis Date  . Anxiety   . Arthritis   . Asthma   . Depression   . Fibromyalgia   . Iron deficiency anemia   . Kienbck's disease    LEFT WRIST  . Left wrist pain    retained deep implant   Current Outpatient Prescriptions on File Prior to Visit  Medication Sig Dispense Refill  . acetaminophen (TYLENOL) 500 MG tablet Take 500 mg by mouth.    . Cariprazine HCl (VRAYLAR) 4.5 MG CAPS Take by mouth.    . cetirizine (ZYRTEC) 10 MG tablet Take 1 tablet (10 mg total) by mouth at bedtime. (Patient taking differently: Take 10 mg by mouth every morning. ) 90 tablet 3  . dextroamphetamine (DEXTROSTAT) 10 MG tablet Take 10 mg by mouth 3 (three) times daily as needed.    . diclofenac sodium (VOLTAREN) 1 % GEL Apply 2 g topically 4 (four) times daily. 100 g 3  . EPIPEN 2-PAK 0.3  MG/0.3ML SOAJ injection Inject 0.3 mg as directed as needed (systemic reaction to seafood allergy).   1  . ferrous sulfate 325 (65 FE) MG tablet Take 325 mg by mouth daily with breakfast.    . fish oil-omega-3 fatty acids 1000 MG capsule Take 1 g by mouth daily.    . fluticasone (FLONASE) 50 MCG/ACT nasal spray Place 2 sprays into both nostrils daily. (Patient taking differently: Place 2 sprays into both nostrils daily as needed. ) 16 g 11  . gabapentin (NEURONTIN) 400 MG capsule Take 1 tab po qafternoon and 2 tabs po qhs 270 capsule 1  . ibuprofen (ADVIL,MOTRIN) 200 MG tablet Take 200 mg by mouth every 6 (six) hours as needed.    . Multiple Vitamins-Minerals (MULTIVITAMIN WITH MINERALS) tablet Take 1 tablet by mouth daily.    Marland Kitchen. PROAIR HFA 108 (90 BASE) MCG/ACT inhaler Inhale 2 puffs into the lungs as needed for wheezing (2 puffs as needed every 4-6h as needed for cough/wheeze).   0  . sertraline (ZOLOFT) 100 MG tablet Take 1.5 tablets (150 mg total) by mouth at bedtime. 135 tablet 1  . vitamin C (ASCORBIC ACID) 500 MG tablet Take 1 tablet (500 mg total) by mouth daily. (Patient not taking: Reported on 02/25/2016) 50 tablet 0   No current facility-administered medications on file prior to visit.  Allergies  Allergen Reactions  . Azithromycin Diarrhea  . Hydrocodone-Acetaminophen Itching  . Latex Itching  . Other Itching and Other (See Comments)    Peas, Pineapple, Mango, Melons, Cantelope, Watermelons.  Dust mites, trees, grass and pollen.  Causes throat to swell with itching. Tree nuts, causes throat to swell with itching.  . Shellfish Allergy Itching and Swelling  . Voltaren [Diclofenac Sodium] Other (See Comments)    Upset stomach; made pt feel like had to go to bathroom  . Penicillins Rash   Review of Systems  Constitutional: Positive for fatigue. Negative for activity change, appetite change and unexpected weight change.  HENT: Positive for congestion, postnasal drip and voice  change. Negative for facial swelling and sore throat.   Respiratory: Positive for cough and chest tightness. Negative for shortness of breath.   Gastrointestinal: Positive for constipation. Negative for vomiting.  Musculoskeletal: Positive for arthralgias, back pain, gait problem, joint swelling and myalgias.  Neurological: Positive for weakness.  Psychiatric/Behavioral: Positive for decreased concentration and dysphoric mood. Negative for sleep disturbance. The patient is nervous/anxious.    BP 130/80 (BP Location: Right Arm, Patient Position: Sitting, Cuff Size: Normal)   Pulse (!) 115   Temp 98.6 F (37 C) (Oral)   Resp 17   Ht 5' 3.5" (1.613 m)   Wt 215 lb (97.5 kg)   SpO2 100%   BMI 37.49 kg/m     Objective:   Physical Exam  Constitutional: She is oriented to person, place, and time. She appears well-developed and well-nourished. No distress.  HENT:  Head: Normocephalic and atraumatic.  Right Ear: A middle ear effusion is present.  Left Ear: A middle ear effusion is present.  Nose: Mucosal edema (and pale) present.  Eyes: Conjunctivae and EOM are normal.  Neck: Neck supple. No tracheal deviation present.  Cardiovascular: Normal rate, regular rhythm, S1 normal, S2 normal and normal heart sounds.   Pulmonary/Chest: Effort normal and breath sounds normal. No respiratory distress.  Musculoskeletal: Normal range of motion.  Neurological: She is alert and oriented to person, place, and time.  Skin: Skin is warm and dry.  Psychiatric: She has a normal mood and affect. Her behavior is normal.  Nursing note and vitals reviewed.     Assessment & Plan:   1. Severe episode of recurrent major depressive disorder, without psychotic features [facility])   Patient is following with Tamela Oddi at [facility]. Has been weaned off Zoloft over the past month and started on mirtazapine. Up to 30 mg. I am concerned about potential weight gain which would definitely exacerbate patient's chronic pain so  advised patient to increase exercise.   Fibromyalgia: Complains of chronic knee pain so needs a low impact aerobic exercise and muscle strengthening. I think that this is the only thing that is going to positively impact her mood and fibromyalgia in the long run. Patient is an excellent candidate for TMS therapy as she has failed so many antidepressants-referral placed. Continue gabapentin for fibromyalgia. May need to wean up in the future.   URI: Patient currently with URI congestion but no signs of asthma flare. Continue symptomatic management.   H/o IDA: Ferritin was great at last check 6 mos prior. Getting some constipation from iron supplements okay to decrease to every other day.   Recheck in 6 months with repeat lab work at that time. Sooner if worse.  Orders Placed This Encounter  Procedures  . Ambulatory referral to Psychiatry    Referral Priority:   Routine  Referral Type:   Psychiatric    Referral Reason:   Specialty Services Required    Requested Specialty:   Psychiatry    Number of Visits Requested:   1    Meds ordered this encounter  Medications  . albuterol (PROAIR HFA) 108 (90 Base) MCG/ACT inhaler    Sig: Inhale 2 puffs into the lungs as needed for wheezing (2 puffs as needed every 4-6h as needed for cough/wheeze).    Dispense:  1 Inhaler    Refill:  5   Over 25 min spent in face-to-face evaluation of and consultation with patient and coordination of care.  Over 50% of this time was spent counseling this patient.  I personally performed the services described in this documentation, which was scribed in my presence. The recorded information has been reviewed and considered, and addended by me as needed.   Tonya SorensonEva Chong January, M.D.  [facility]  [place] [address] [place], [place] [address] [phone] phone [phone]) [phone] fax  02/25/16 10:09 AM

## 2016-02-25 NOTE — Telephone Encounter (Signed)
Hi Dr. Clelia CroftShaw you have placed a referral for psychiatry for this patient and I see in your notes she already sees a psychiatrist I was unsure whether or not I should send her referral to the same place she already sees or to a new psychiatrist    Thanks, [place] (referrals)

## 2016-02-25 NOTE — Patient Instructions (Addendum)
IF you received an x-ray today, you will receive an invoice from [facility]. Please contact [facility] at [phone] with questions or concerns regarding your invoice.   IF you received labwork today, you will receive an invoice from [facility]. Please contact [facility] at [phone] with questions or concerns regarding your invoice.   Our billing staff will not be able to assist you with questions regarding bills from these companies.  You will be contacted with the lab results as soon as they are available. The fastest way to get your results is to activate your My Chart account. Instructions are located on the last page of this paperwork. If you have not heard from us regarding the results in 2 weeks, please contact this office.      Myofascial Pain Syndrome and Fibromyalgia Introduction Myofascial pain syndrome and fibromyalgia are both pain disorders. This pain may be felt mainly in your muscles.  Myofascial pain syndrome:  Always has trigger points or tender points in the muscle that will cause pain when pressed. The pain may come and go.  Usually affects your neck, upper back, and shoulder areas. The pain often radiates into your arms and hands.  Fibromyalgia:  Has muscle pains and tenderness that come and go.  Is often associated with fatigue and sleep disturbances.  Has trigger points.  Tends to be long-lasting (chronic), but is not life-threatening. Fibromyalgia and myofascial pain are not the same. However, they often occur together. If you have both conditions, each can make the other worse. Both are common and can cause enough pain and fatigue to make day-to-day activities difficult. What are the causes? The exact causes of fibromyalgia and myofascial pain are not known. People with certain gene types may be more likely to develop fibromyalgia. Some factors can be triggers for both conditions, such as:  Spine  disorders.  Arthritis.  Severe injury (trauma) and other physical stressors.  Being under a lot of stress.  A medical illness. What are the signs or symptoms? Fibromyalgia  The main symptom of fibromyalgia is widespread pain and tenderness in your muscles. This can vary over time. Pain is sometimes described as stabbing, shooting, or burning. You may have tingling or numbness, too. You may also have sleep problems and fatigue. You may wake up feeling tired and groggy (fibro fog). Other symptoms may include:  Bowel and bladder problems.  Headaches.  Visual problems.  Problems with odors and noises.  Depression or mood changes.  Painful menstrual periods (dysmenorrhea).  Dry skin or eyes. Myofascial pain syndrome  Symptoms of myofascial pain syndrome include:  Tight, ropy bands of muscle.  Uncomfortable sensations in muscular areas, such as:  Aching.  Cramping.  Burning.  Numbness.  Tingling.  Muscle weakness.  Trouble moving certain muscles freely (range of motion). How is this diagnosed? There are no specific tests to diagnose fibromyalgia or myofascial pain syndrome. Both can be hard to diagnose because their symptoms are common in many other conditions. Your health care provider may suspect one or both of these conditions based on your symptoms and medical history. Your health care provider will also do a physical exam. The key to diagnosing fibromyalgia is having pain, fatigue, and other symptoms for more than three months that cannot be explained by another condition. The key to diagnosing myofascial pain syndrome is finding trigger points in muscles that are tender and cause pain elsewhere in your body (referred pain). How is this treated? Treating  fibromyalgia and myofascial pain often requires a team of health care providers. This usually starts with your primary provider and a physical therapist. You may also find it helpful to work with alternative health  care providers, such as massage therapists or acupuncturists. Treatment for fibromyalgia may include medicines. This may include nonsteroidal anti-inflammatory drugs (NSAIDs), along with other medicines. Treatment for myofascial pain may also include:  NSAIDs.  Cooling and stretching of muscles.  Trigger point injections.  Sound wave (ultrasound) treatments to stimulate muscles. Follow these instructions at home:  Take medicines only as directed by your health care provider.  Exercise as directed by your health care provider or physical therapist.  Try to avoid stressful situations.  Practice relaxation techniques to control your stress. You may want to try:  Biofeedback.  Visual imagery.  Hypnosis.  Muscle relaxation.  Yoga.  Meditation.  Talk to your health care provider about alternative treatments, such as acupuncture or massage treatment.  Maintain a healthy lifestyle. This includes eating a healthy diet and getting enough sleep.  Consider joining a support group.  Do not do activities that stress or strain your muscles. That includes repetitive motions and heavy lifting. Where to find more information:  National Fibromyalgia Association: www.fmaware.org  Arthritis Foundation: www.arthritis.org  American Chronic Pain Association: GumSearch.nlwww.theacpa.org/condition/myofascial-pain Contact a health care provider if:  You have new symptoms.  Your symptoms get worse.  You have side effects from your medicines.  You have trouble sleeping.  Your condition is causing depression or anxiety. This information is not intended to replace advice given to you by your health care provider. Make sure you discuss any questions you have with your health care provider. Document Released: 03/23/2005 Document Revised: 08/29/2015 Document Reviewed: 12/27/2013  2017 Elsevier

## 2016-02-26 ENCOUNTER — Telehealth: Payer: Self-pay | Admitting: Family Medicine

## 2016-02-26 NOTE — Telephone Encounter (Signed)
No, she needs to be referred to the [facility] that I noted in the referral for Transcranial Magnetic Stimulation Therapy - they [facility] have [facility]

## 2016-04-21 ENCOUNTER — Other Ambulatory Visit: Payer: Self-pay | Admitting: Obstetrics and Gynecology

## 2016-04-23 LAB — CYTOLOGY - PAP

## 2016-06-14 ENCOUNTER — Other Ambulatory Visit: Payer: Self-pay | Admitting: Family Medicine

## 2016-08-26 ENCOUNTER — Ambulatory Visit: Payer: BLUE CROSS/BLUE SHIELD | Admitting: Family Medicine

## 2016-09-03 ENCOUNTER — Ambulatory Visit (INDEPENDENT_AMBULATORY_CARE_PROVIDER_SITE_OTHER): Payer: BLUE CROSS/BLUE SHIELD | Admitting: Family Medicine

## 2016-09-03 ENCOUNTER — Encounter: Payer: Self-pay | Admitting: Family Medicine

## 2016-09-03 VITALS — BP 108/76 | HR 85 | Temp 97.9°F | Resp 16 | Ht 63.5 in | Wt 198.0 lb

## 2016-09-03 DIAGNOSIS — D500 Iron deficiency anemia secondary to blood loss (chronic): Secondary | ICD-10-CM

## 2016-09-03 DIAGNOSIS — R5382 Chronic fatigue, unspecified: Secondary | ICD-10-CM

## 2016-09-03 DIAGNOSIS — M797 Fibromyalgia: Secondary | ICD-10-CM

## 2016-09-03 NOTE — Patient Instructions (Addendum)
   IF you received an x-ray today, you will receive an invoice from [facility]. Please contact [facility] at [phone] with questions or concerns regarding your invoice.   IF you received labwork today, you will receive an invoice from [facility]. Please contact [facility] at [phone] with questions or concerns regarding your invoice.   Our billing staff will not be able to assist you with questions regarding bills from these companies.  You will be contacted with the lab results as soon as they are available. The fastest way to get your results is to activate your My Chart account. Instructions are located on the last page of this paperwork. If you have not heard from us regarding the results in 2 weeks, please contact this office.     Myofascial Pain Syndrome and Fibromyalgia Myofascial pain syndrome and fibromyalgia are both pain disorders. This pain may be felt mainly in your muscles.  Myofascial pain syndrome: ? Always has trigger points or tender points in the muscle that will cause pain when pressed. The pain may come and go. ? Usually affects your neck, upper back, and shoulder areas. The pain often radiates into your arms and hands.  Fibromyalgia: ? Has muscle pains and tenderness that come and go. ? Is often associated with fatigue and sleep disturbances. ? Has trigger points. ? Tends to be long-lasting (chronic), but is not life-threatening.  Fibromyalgia and myofascial pain are not the same. However, they often occur together. If you have both conditions, each can make the other worse. Both are common and can cause enough pain and fatigue to make day-to-day activities difficult. What are the causes? The exact causes of fibromyalgia and myofascial pain are not known. People with certain gene types may be more likely to develop fibromyalgia. Some factors can be triggers for both conditions, such as:  Spine disorders.  Arthritis.  Severe injury  (trauma) and other physical stressors.  Being under a lot of stress.  A medical illness.  What are the signs or symptoms? Fibromyalgia The main symptom of fibromyalgia is widespread pain and tenderness in your muscles. This can vary over time. Pain is sometimes described as stabbing, shooting, or burning. You may have tingling or numbness, too. You may also have sleep problems and fatigue. You may wake up feeling tired and groggy (fibro fog). Other symptoms may include:  Bowel and bladder problems.  Headaches.  Visual problems.  Problems with odors and noises.  Depression or mood changes.  Painful menstrual periods (dysmenorrhea).  Dry skin or eyes.  Myofascial pain syndrome Symptoms of myofascial pain syndrome include:  Tight, ropy bands of muscle.  Uncomfortable sensations in muscular areas, such as: ? Aching. ? Cramping. ? Burning. ? Numbness. ? Tingling. ? Muscle weakness.  Trouble moving certain muscles freely (range of motion).  How is this diagnosed? There are no specific tests to diagnose fibromyalgia or myofascial pain syndrome. Both can be hard to diagnose because their symptoms are common in many other conditions. Your health care provider may suspect one or both of these conditions based on your symptoms and medical history. Your health care provider will also do a physical exam. The key to diagnosing fibromyalgia is having pain, fatigue, and other symptoms for more than three months that cannot be explained by another condition. The key to diagnosing myofascial pain syndrome is finding trigger points in muscles that are tender and cause pain elsewhere in your body (referred pain). How is this treated? Treating fibromyalgia and myofascial   pain often requires a team of health care providers. This usually starts with your primary provider and a physical therapist. You may also find it helpful to work with alternative health care providers, such as massage  therapists or acupuncturists. Treatment for fibromyalgia may include medicines. This may include nonsteroidal anti-inflammatory drugs (NSAIDs), along with other medicines. Treatment for myofascial pain may also include:  NSAIDs.  Cooling and stretching of muscles.  Trigger point injections.  Sound wave (ultrasound) treatments to stimulate muscles.  Follow these instructions at home:  Take medicines only as directed by your health care provider.  Exercise as directed by your health care provider or physical therapist.  Try to avoid stressful situations.  Practice relaxation techniques to control your stress. You may want to try: ? Biofeedback. ? Visual imagery. ? Hypnosis. ? Muscle relaxation. ? Yoga. ? Meditation.  Talk to your health care provider about alternative treatments, such as acupuncture or massage treatment.  Maintain a healthy lifestyle. This includes eating a healthy diet and getting enough sleep.  Consider joining a support group.  Do not do activities that stress or strain your muscles. That includes repetitive motions and heavy lifting. Where to find more information:  National Fibromyalgia Association: www.fmaware.org  Arthritis Foundation: www.arthritis.org  American Chronic Pain Association: www.theacpa.org/condition/myofascial-pain Contact a health care provider if:  You have new symptoms.  Your symptoms get worse.  You have side effects from your medicines.  You have trouble sleeping.  Your condition is causing depression or anxiety. This information is not intended to replace advice given to you by your health care provider. Make sure you discuss any questions you have with your health care provider. Document Released: 03/23/2005 Document Revised: 08/29/2015 Document Reviewed: 12/27/2013 Elsevier Interactive Patient Education  2018 Elsevier Inc.  

## 2016-09-03 NOTE — Progress Notes (Signed)
Subjective:    Patient ID: RIDA LOUDIN, female    DOB: 01/19/1976, 41 y.o.   MRN: [id number] Chief Complaint  Patient presents with  . Medication check    getting ready to start some new medication  . Fibromyalgia  . Depression    patient is currently being treated     HPI   Jinny Blossom has her hearing coming up for her disability - using [employer] Apple for her lawyer.   She is planning to go off of the dextrostat and go onto vyvanse but the trazedone 19m but planning to go up to 523mand still wakes up about 2-3 am and gets on her phyone but it has helped with her depression and sleep.  She was feeling very depressed and wanting to go up on the dextrostat which her provider doesn't want to so planning to change to vyvanse. Tired in the mroning.  FM is was in a good phase over the next few months but now is noticing more pain in her throat and forearms. She does go walking with her dog or mom every day.  Not much appetite and lost weight which is good.  Depression slightly better but definitely still present. Left wrist has gotten better - she is still following with ortho. Rt wrist is fine. Still having trouble with her knees - difficult to get up and down still.  Asthma is good- she just gets exercise-onset and does not trigger with ehr walking. She is getting eye watering and stinging out of nowhere. Using flonase rarely.  With gabapentin 40060ms taking 1 tab in the morning and 2 tabs at night though her psychiatrist is encouraging her to spread it out. Has not needed iron supp - she ran out more than sev mos ago. Periods are non-existent because of your IUD.  Using zyrtec prn for allergies, no eye drops. Rare prn ibuprofen/apap.  Taking Vitamin D supp daily.  Past Medical History:  Diagnosis Date  . Anxiety   . Arthritis   . Asthma   . Depression   . Fibromyalgia   . Iron deficiency anemia   . Kienbck's disease    LEFT WRIST  . Left wrist pain    retained deep implant     Past Surgical History:  Procedure Laterality Date  . LAPAROSCOPIC APPENDECTOMY N/A 11/04/2013   Procedure: APPENDECTOMY LAPAROSCOPIC;  Surgeon: MatPedro EarlsD;  Location: [facility];  Service: General;  Laterality: N/A;  . REMOVAL OF IMPLANT Left 06/13/2015   Procedure: LEFT WRIST DEEP IMPLANT REMOVAL ;  Surgeon: FreIran PlanasD;  [facility];  Laterality: Left;  . WRIST RECONSTRUCTION Left 04-15-2015    for Kienbock's disease    Current Outpatient Prescriptions on File Prior to Visit  Medication Sig Dispense Refill  . acetaminophen (TYLENOL) 500 MG tablet Take 500 mg by mouth.    . aMarland Kitchenbuterol (PROAIR HFA) 108 (90 Base) MCG/ACT inhaler Inhale 2 puffs into the lungs as needed for wheezing (2 puffs as needed every 4-6h as needed for cough/wheeze). 1 Inhaler 5  . Cariprazine HCl (VRAYLAR) 4.5 MG CAPS Take by mouth.    . cetirizine (ZYRTEC) 10 MG tablet Take 1 tablet (10 mg total) by mouth at bedtime. (Patient taking differently: Take 10 mg by mouth every morning. ) 90 tablet 3  . dextroamphetamine (DEXTROSTAT) 10 MG tablet Take 10 mg by mouth 3 (three) times daily as needed.    . EMarland KitchenIPEN 2-PAK 0.3  MG/0.3ML SOAJ injection Inject 0.3 mg as directed as needed (systemic reaction to seafood allergy).   1  . ferrous sulfate 325 (65 FE) MG tablet Take 325 mg by mouth daily with breakfast.    . fish oil-omega-3 fatty acids 1000 MG capsule Take 1 g by mouth daily.    . fluticasone (FLONASE) 50 MCG/ACT nasal spray Place 2 sprays into both nostrils daily. (Patient taking differently: Place 2 sprays into both nostrils daily as needed. ) 16 g 11  . gabapentin (NEURONTIN) 400 MG capsule Take 1 tab po qafternoon and 2 tabs po qhs (Patient taking differently: Take 400 mg by mouth 3 (three) times daily. Take 1 caps po qafternoon and 2 caps po qhs) 270 capsule 1  . ibuprofen (ADVIL,MOTRIN) 200 MG tablet Take 200 mg by mouth every 6 (six) hours as needed.    .  Multiple Vitamins-Minerals (MULTIVITAMIN WITH MINERALS) tablet Take 1 tablet by mouth daily.    . sertraline (ZOLOFT) 100 MG tablet TAKE 1.5 TABLETS (150 MG TOTAL) BY MOUTH AT BEDTIME. 135 tablet 0   No current facility-administered medications on file prior to visit.    Allergies  Allergen Reactions  . Azithromycin Diarrhea  . Hydrocodone-Acetaminophen Itching  . Latex Itching  . Other Itching and Other (See Comments)    Peas, Pineapple, Mango, Melons, Cantelope, Watermelons.  Dust mites, trees, grass and pollen.  Causes throat to swell with itching. Tree nuts, causes throat to swell with itching.  . Shellfish Allergy Itching and Swelling  . Voltaren [Diclofenac Sodium] Other (See Comments)    Upset stomach; made pt feel like had to go to bathroom  . Penicillins Rash   Family History  Problem Relation Age of Onset  . Depression Mother   . Diabetes Mother   . Mental illness Mother   . COPD Father   . Mental illness Father   . Hypertension Father   . Hyperlipidemia Father   . Depression Maternal Grandmother   . Mental illness Maternal Grandmother   . Diabetes Maternal Grandfather   . Mental illness Maternal Grandfather   . Cancer Paternal Grandfather   . Mental illness Paternal Grandfather   . Mental illness Sister   . Mental illness Sister    Social History   Social History  . Marital status: Married    Spouse name: [name]  . Number of children: 1  . Years of education: Assoc   Occupational History  .      not employed   Social History Main Topics  . Smoking status: Former Smoker    Years: 5.00    Quit date: 04/06/1996  . Smokeless tobacco: Never Used  . Alcohol use 0.0 oz/week     Comment: occas.  . Drug use: No  . Sexual activity: Not Asked     Comment: 2015-- Placement Mirena IUD   Other Topics Concern  . None   Social History Narrative   Patient consumes 4-5 cups of caffeine daily.   Depression screen [facility] 2/9 09/03/2016 02/25/2016 12/05/2015 08/01/2015  05/09/2015  Decreased Interest 1 2 0 2 1  Down, Depressed, Hopeless 1 2 0 3 1  PHQ - 2 Score 2 4 0 5 2  Altered sleeping (No Data) 2 - 2 1  Tired, decreased energy 1 2 - 3 1  Change in appetite 1 2 - 2 0  Feeling bad or failure about yourself  1 2 - 2 1  Trouble concentrating 1 2 - 3 3  Moving  slowly or fidgety/restless 1 2 - 2 0  Suicidal thoughts 0 0 - 0 0  PHQ-9 Score - 16 - 19 8  Difficult doing work/chores Very difficult Very difficult - - -  Some recent data might be hidden    Review of Systems See hpi    Objective:   Physical Exam  Constitutional: She is oriented to person, place, and time. She appears well-developed and well-nourished. No distress.  HENT:  Head: Normocephalic and atraumatic.  Right Ear: External ear normal.  Left Ear: External ear normal.  Eyes: Conjunctivae are normal. No scleral icterus.  Neck: Normal range of motion. Neck supple. No thyromegaly present.  Cardiovascular: Normal rate, regular rhythm, normal heart sounds and intact distal pulses.   Pulmonary/Chest: Effort normal and breath sounds normal. No respiratory distress.  Musculoskeletal: She exhibits no edema.  Lymphadenopathy:    She has no cervical adenopathy.  Neurological: She is alert and oriented to person, place, and time.  Skin: Skin is warm and dry. She is not diaphoretic. No erythema.  Psychiatric: She has a normal mood and affect. Her behavior is normal.      BP 108/76   Pulse 85   Temp 97.9 F (36.6 C) (Oral)   Resp 16   Ht 5' 3.5" (1.613 m)   Wt 198 lb (89.8 kg)   SpO2 98%   BMI 34.52 kg/m       Assessment & Plan:  Cbc, ferritin, cmp, tsh, esr FAX LABS TO [facility]  Fax: [phone]  1. Chronic fatigue   2. Iron deficiency anemia due to chronic blood loss   3. Fibromyalgia     Orders Placed This Encounter  Procedures  . CBC  . Comprehensive metabolic panel  . TSH  . Ferritin  . Care order/instruction:    Scheduling Instructions:     Complete  orders, AVS and go.     Delman Cheadle, M.D.  Primary Care at [facility] [address] [place], [place] [address] [phone] phone [phone] fax  09/05/16 11:21 PM

## 2016-09-04 LAB — COMPREHENSIVE METABOLIC PANEL
ALBUMIN: 4.2 g/dL (ref 3.5–5.5)
ALK PHOS: 97 IU/L (ref 39–117)
ALT: 16 IU/L (ref 0–32)
AST: 13 IU/L (ref 0–40)
Albumin/Globulin Ratio: 1.6 (ref 1.2–2.2)
BUN / CREAT RATIO: 11 (ref 9–23)
BUN: 8 mg/dL (ref 6–24)
Bilirubin Total: 0.3 mg/dL (ref 0.0–1.2)
CO2: 26 mmol/L (ref 18–29)
CREATININE: 0.75 mg/dL (ref 0.57–1.00)
Calcium: 9.4 mg/dL (ref 8.7–10.2)
Chloride: 104 mmol/L (ref 96–106)
GFR calc Af Amer: 114 mL/min/{1.73_m2} (ref >59)
GFR calc non Af Amer: 99 mL/min/{1.73_m2} (ref >59)
GLUCOSE: 110 mg/dL — AB (ref 65–99)
Globulin, Total: 2.7 g/dL (ref 1.5–4.5)
Potassium: 4.2 mmol/L (ref 3.5–5.2)
Sodium: 143 mmol/L (ref 134–144)
TOTAL PROTEIN: 6.9 g/dL (ref 6.0–8.5)

## 2016-09-04 LAB — CBC
HEMOGLOBIN: 13.9 g/dL (ref 11.1–15.9)
Hematocrit: 43.3 % (ref 34.0–46.6)
MCH: 30.2 pg (ref 26.6–33.0)
MCHC: 32.1 g/dL (ref 31.5–35.7)
MCV: 94 fL (ref 79–97)
Platelets: 367 10*3/uL (ref 150–379)
RBC: 4.6 x10E6/uL (ref 3.77–5.28)
RDW: 13.5 % (ref 12.3–15.4)
WBC: 7.3 10*3/uL (ref 3.4–10.8)

## 2016-09-04 LAB — FERRITIN: Ferritin: 151 ng/mL — ABNORMAL HIGH (ref 15–150)

## 2016-09-04 LAB — TSH: TSH: 1.45 u[IU]/mL (ref 0.450–4.500)

## 2016-10-23 IMAGING — CR DG CERVICAL SPINE COMPLETE 4+V
6 series · 6 of 6 positions shown · non-contrast
Comparison: None.

CLINICAL DATA: Neck pain, persistent.  No recent trauma.

EXAM:
CERVICAL SPINE  4+ VIEWS

[oblique (1 of 2)]
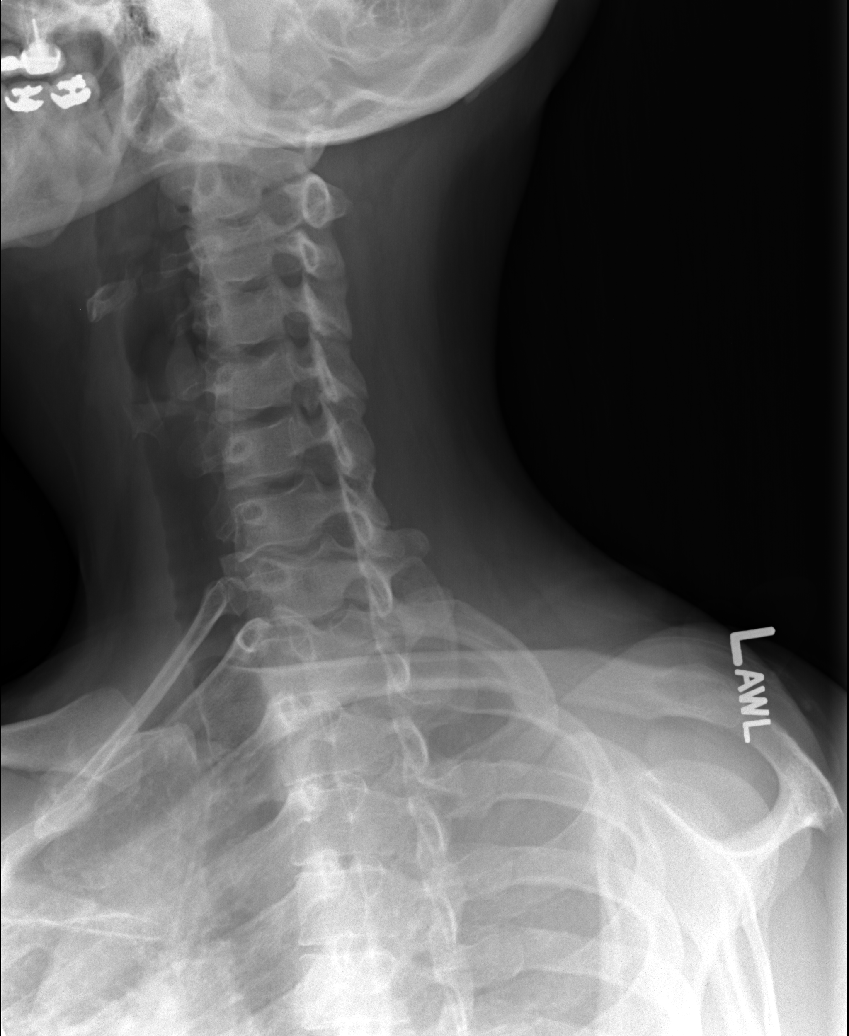

[AP (1 of 2)]
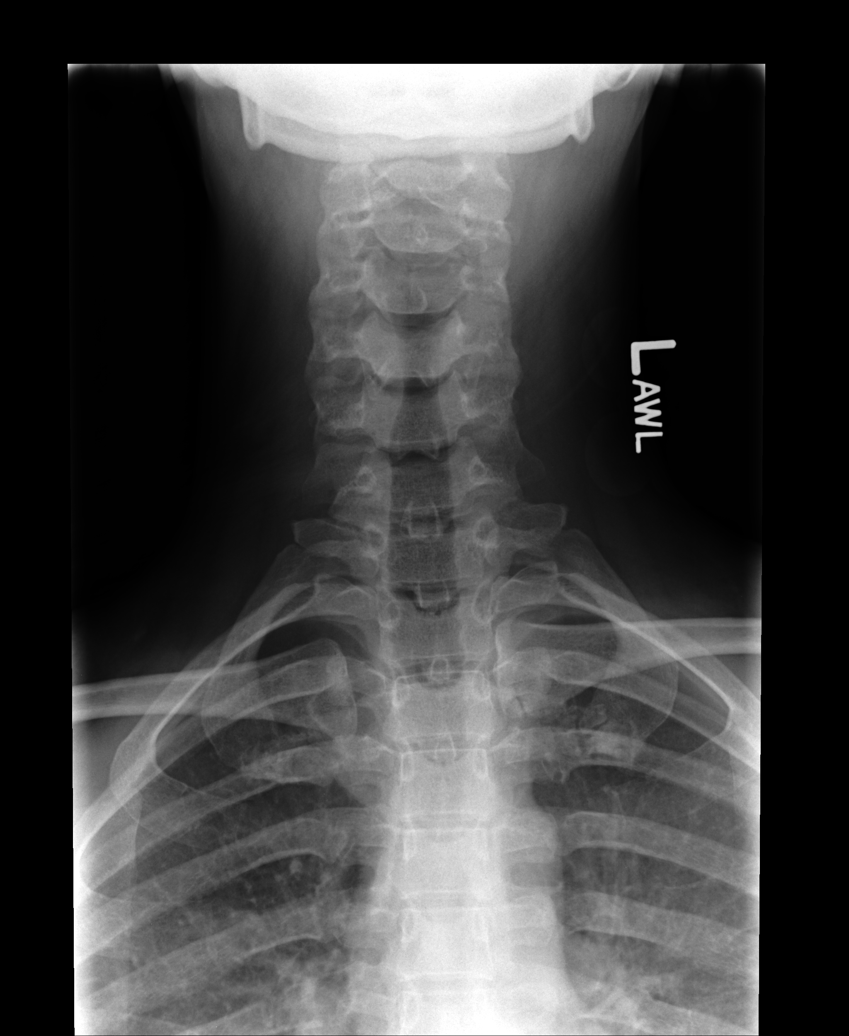

[other]
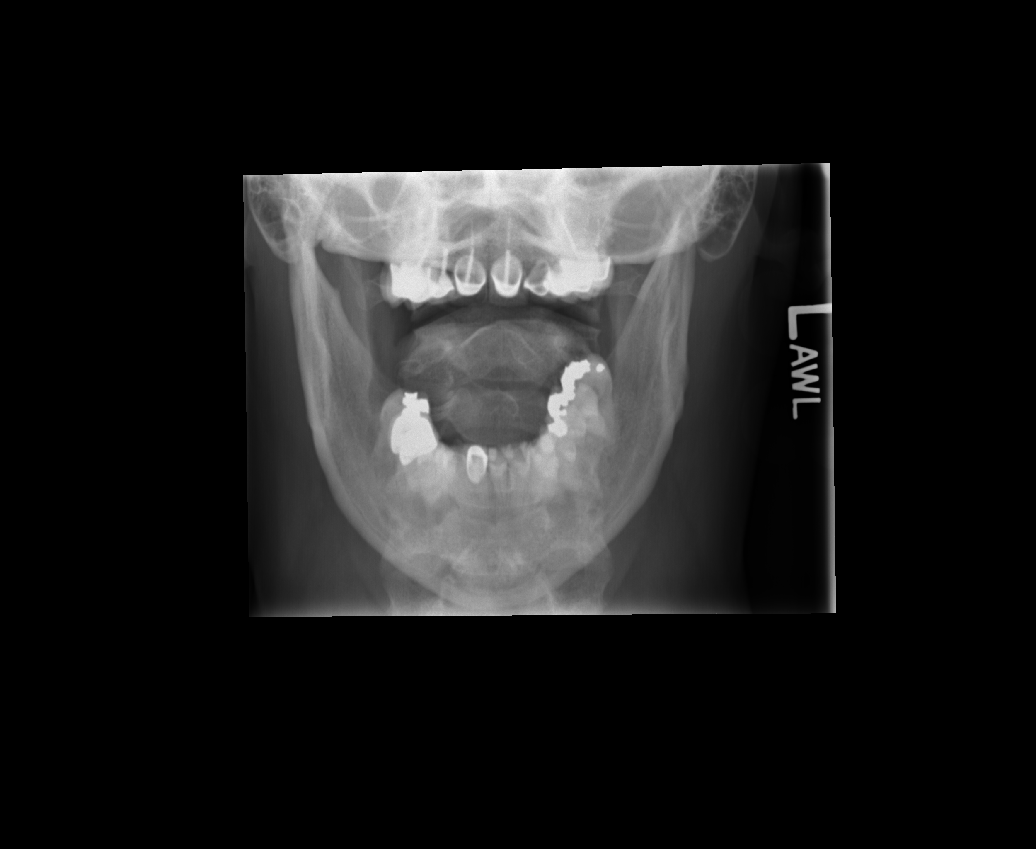

[lateral]
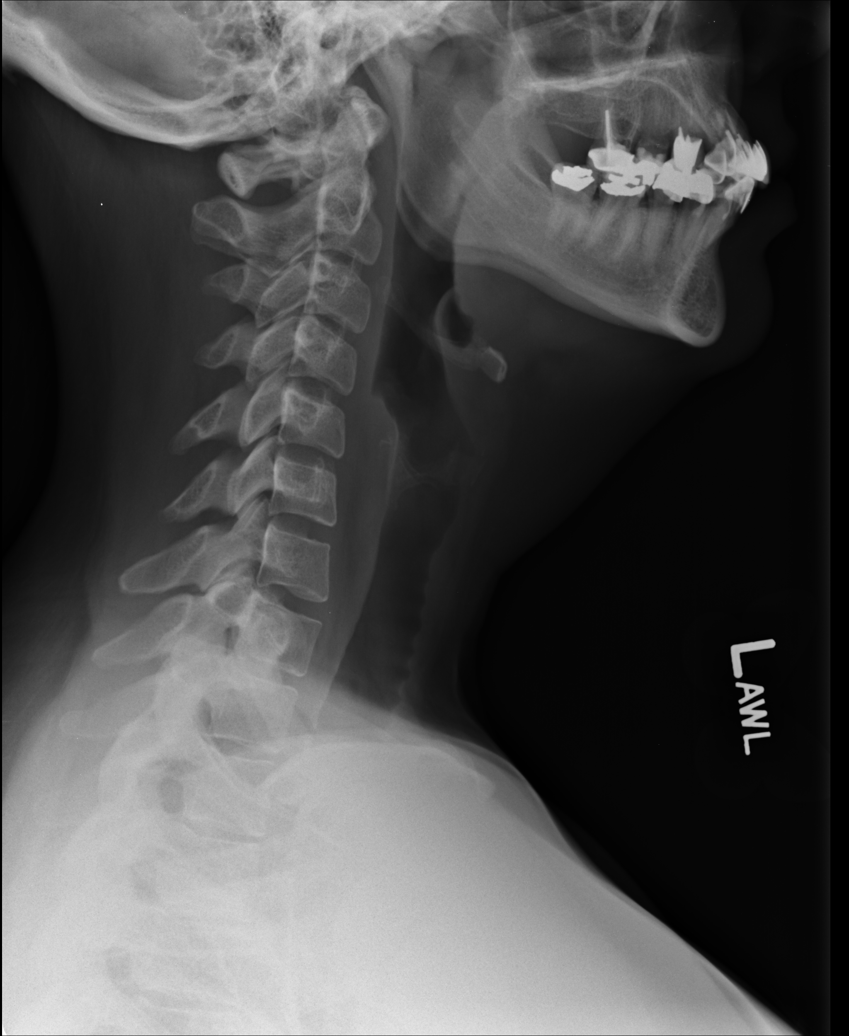

[oblique (2 of 2)]
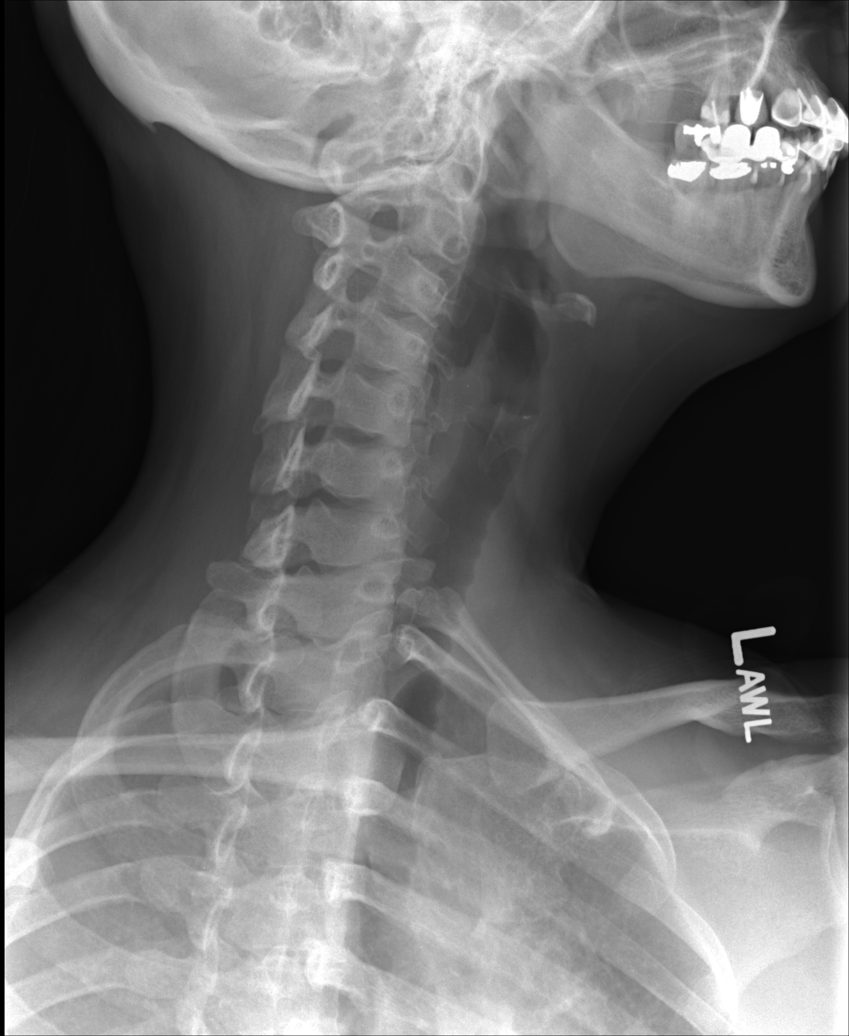

[AP (2 of 2)]
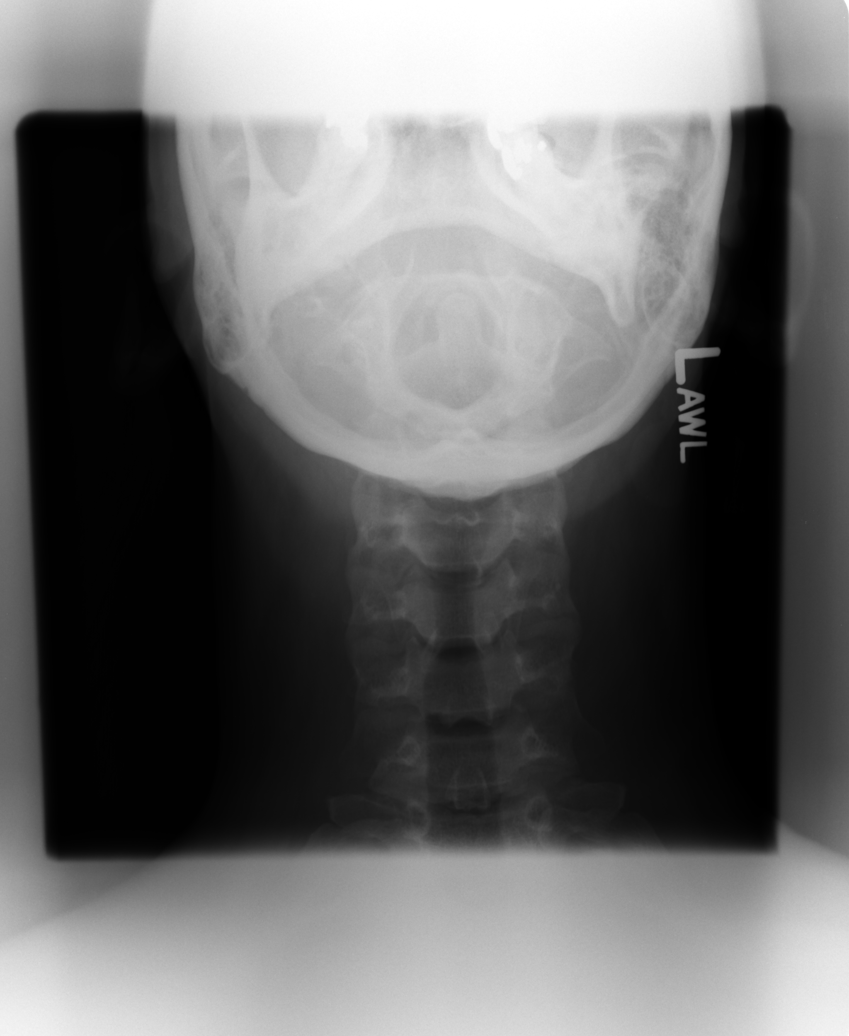

[6 of 6 positions shown; findings below may reference images not displayed]

FINDINGS: Frontal, lateral, open-mouth odontoid, and bilateral oblique views
were obtained. There is no fracture or spondylolisthesis.
Prevertebral soft tissues and predental space regions are normal.
Disc spaces appear intact. There is no appreciable exit foraminal
narrowing on the oblique views. There is mild reversal of lordotic
curvature.
IMPRESSION: Mild reversal of lordotic curvature. This finding is most likely due
to muscle spasm. If there is clinical concern for ligamentous
injury, lateral flexion-extension views could be helpful to further
evaluate. There is no fracture or spondylolisthesis. No appreciable
arthropathic change.

## 2016-11-14 ENCOUNTER — Encounter: Payer: Self-pay | Admitting: Family Medicine

## 2016-11-14 ENCOUNTER — Ambulatory Visit (INDEPENDENT_AMBULATORY_CARE_PROVIDER_SITE_OTHER): Payer: BLUE CROSS/BLUE SHIELD | Admitting: Family Medicine

## 2016-11-14 VITALS — BP 125/83 | HR 80 | Temp 98.7°F | Resp 16 | Ht 63.0 in | Wt 199.2 lb

## 2016-11-14 DIAGNOSIS — Z113 Encounter for screening for infections with a predominantly sexual mode of transmission: Secondary | ICD-10-CM | POA: Diagnosis not present

## 2016-11-14 NOTE — Patient Instructions (Addendum)
We will let you know the results of your lab tests sometime during the next week. In the event you do not hear from us over the next week please call back.  I encourage you to continue on seeing her therapist. Work on seeking a healthier sexual lifestyle.  Always use protection. If you choose to have sex, require condom use.    IF you received an x-ray today, you will receive an invoice from [facility]. Please contact [facility] at [phone] with questions or concerns regarding your invoice.   IF you received labwork today, you will receive an invoice from [facility]. Please contact [facility] at [phone] with questions or concerns regarding your invoice.   Our billing staff will not be able to assist you with questions regarding bills from these companies.  You will be contacted with the lab results as soon as they are available. The fastest way to get your results is to activate your My Chart account. Instructions are located on the last page of this paperwork. If you have not heard from us regarding the results in 2 weeks, please contact this office.

## 2016-11-14 NOTE — Addendum Note (Signed)
Addended by: Damita LackLORING, Mischa Pollard S on: 11/14/2016 01:05 PM   Modules accepted: Orders

## 2016-11-14 NOTE — Progress Notes (Signed)
Patient ID: Tonya Perry, female    DOB: 15-Dec-1975  Age: 41 y.o. MRN: [id number]  Chief Complaint  Patient presents with  . SEXUALLY TRANSMITTED DISEASE     Want to make sure she doesn't have anything     Subjective:   41 year old lady who has been fairly frequently having unprotected sex, has had multiple partners. She has no history of prior STDs. She has been separated from her husband and sexually estranged from her husband for some time now. She is gravida 1 para 521 with a 41-year-old child. She denies any symptoms, was just concerned because of her sexual habits that she should be tested.  Current allergies, medications, problem list, past/family and social histories reviewed.  Objective:  BP 125/83   Pulse 80   Temp 98.7 F (37.1 C) (Oral)   Resp 16   Ht 5\' 3"  (1.6 m)   Wt 199 lb 3.2 oz (90.4 kg)   LMP  (LMP Unknown)   SpO2 97%   BMI 35.29 kg/m   Exam not done today. She did have a Pap test done early this year elsewhere which is negative.  Assessment & Plan:   Assessment: 1. Screening for STDs (sexually transmitted diseases)       Plan: Labs ordered. We will let her know the results of things.  Orders Placed This Encounter  Procedures  . GC/Chlamydia Probe Amp    Order Specific Question:   Source    Answer:   urine  . HIV antibody  . RPR  . Hepatitis C Ab Reflex HCV RNA, QUANT    Meds ordered this encounter  Medications  . VYVANSE 30 MG capsule  . lamoTRIgine (LAMICTAL) 25 MG tablet  . sertraline (ZOLOFT) 100 MG tablet    Sig: sertraline 100 mg tablet         Patient Instructions   We will let you know the results of your lab tests sometime during the next week. In the event you do not hear from us over the next week please call back.  I encourage you to continue on seeing her therapist. Work on seeking a healthier sexual lifestyle.  Always use protection. If you choose to have sex, require condom use.    IF you received an x-ray  today, you will receive an invoice from [facility]. Please contact [facility] at [phone] with questions or concerns regarding your invoice.   IF you received labwork today, you will receive an invoice from [facility]. Please contact [facility] at [phone] with questions or concerns regarding your invoice.   Our billing staff will not be able to assist you with questions regarding bills from these companies.  You will be contacted with the lab results as soon as they are available. The fastest way to get your results is to activate your My Chart account. Instructions are located on the last page of this paperwork. If you have not heard from us regarding the results in 2 weeks, please contact this office.         Return if symptoms worsen or fail to improve.   HOPPER,DAVID, MD 11/14/2016

## 2016-11-15 LAB — HCV COMMENT:

## 2016-11-15 LAB — HEPATITIS C ANTIBODY (REFLEX): HCV Ab: 0.1 s/co ratio (ref 0.0–0.9)

## 2016-11-15 LAB — RPR: RPR Ser Ql: NONREACTIVE

## 2016-11-15 LAB — HIV ANTIBODY (ROUTINE TESTING W REFLEX): HIV SCREEN 4TH GENERATION: NONREACTIVE

## 2016-11-17 LAB — GC/CHLAMYDIA PROBE AMP
CHLAMYDIA, DNA PROBE: NEGATIVE
NEISSERIA GONORRHOEAE BY PCR: NEGATIVE

## 2017-01-15 ENCOUNTER — Ambulatory Visit (INDEPENDENT_AMBULATORY_CARE_PROVIDER_SITE_OTHER): Payer: BLUE CROSS/BLUE SHIELD | Admitting: Family Medicine

## 2017-01-15 ENCOUNTER — Encounter: Payer: Self-pay | Admitting: Family Medicine

## 2017-01-15 VITALS — BP 128/78 | HR 130 | Temp 98.6°F | Resp 18 | Ht 63.0 in | Wt 194.6 lb

## 2017-01-15 DIAGNOSIS — N760 Acute vaginitis: Secondary | ICD-10-CM

## 2017-01-15 DIAGNOSIS — N898 Other specified noninflammatory disorders of vagina: Secondary | ICD-10-CM | POA: Diagnosis not present

## 2017-01-15 DIAGNOSIS — Z23 Encounter for immunization: Secondary | ICD-10-CM

## 2017-01-15 DIAGNOSIS — B9689 Other specified bacterial agents as the cause of diseases classified elsewhere: Secondary | ICD-10-CM | POA: Diagnosis not present

## 2017-01-15 DIAGNOSIS — Z113 Encounter for screening for infections with a predominantly sexual mode of transmission: Secondary | ICD-10-CM | POA: Diagnosis not present

## 2017-01-15 LAB — POCT WET + KOH PREP
Trich by wet prep: ABSENT
Yeast by KOH: ABSENT
Yeast by wet prep: ABSENT

## 2017-01-15 MED ORDER — METRONIDAZOLE 0.75 % VA GEL: 1.0000 | Freq: Two times a day (BID) | VAGINAL | 0 refills | Status: DC

## 2017-01-15 NOTE — Patient Instructions (Signed)
     IF you received an x-ray today, you will receive an invoice from [facility]. Please contact [facility] at [phone] with questions or concerns regarding your invoice.   IF you received labwork today, you will receive an invoice from [facility]. Please contact [facility] at [phone] with questions or concerns regarding your invoice.   Our billing staff will not be able to assist you with questions regarding bills from these companies.  You will be contacted with the lab results as soon as they are available. The fastest way to get your results is to activate your My Chart account. Instructions are located on the last page of this paperwork. If you have not heard from us regarding the results in 2 weeks, please contact this office.     

## 2017-01-15 NOTE — Progress Notes (Signed)
10/12/201810:50 AM  Tonya Perry 1975-06-05, 41 y.o. female [id number]  Chief Complaint  Patient presents with  . Exposure to STD  . Depression    screening was a 24     HPI:   Patient is a 41 y.o. female who presents today requesting STD testing, Last checked in May 2018. Not in a monogamous relationship. IUD for birth control.   Reports funny smelling vaginal discharge, discolored. No pelvic pain, nausea, vomiting, fever or chills.   Denies SI  Depression screen [facility] 2/9 01/15/2017 11/14/2016 09/03/2016  Decreased Interest Down, Depressed, Hopeless PHQ - 2 Score Altered sleeping 3 3 (No Data)  Tired, decreased energy Change in appetite Feeling bad or failure about yourself  Trouble concentrating Moving slowly or fidgety/restless Suicidal thoughts 0 2 0  PHQ-9 Score 24 25 -  Difficult doing work/chores - Extremely dIfficult Very difficult  Some recent data might be hidden    Allergies  Allergen Reactions  . Azithromycin Diarrhea  . Hydrocodone-Acetaminophen Itching  . Latex Itching  . Other Itching and Other (See Comments)    Peas, Pineapple, Mango, Melons, Cantelope, Watermelons.  Dust mites, trees, grass and pollen.  Causes throat to swell with itching. Tree nuts, causes throat to swell with itching.  . Shellfish Allergy Itching and Swelling  . Voltaren [Diclofenac Sodium] Other (See Comments)    Upset stomach; made pt feel like had to go to bathroom  . Penicillins Rash    Prior to Admission medications   Medication Sig Start Date End Date Taking? Authorizing Provider  acetaminophen (TYLENOL) 500 MG tablet Take 500 mg by mouth.   Yes [provider]  albuterol (PROAIR HFA) 108 (90 Base) MCG/ACT inhaler Inhale 2 puffs into the lungs as needed for wheezing (2 puffs as needed every 4-6h as needed for cough/wheeze). 02/25/16  Yes [name], MD  Cariprazine HCl (VRAYLAR) 4.5 MG CAPS Take by  mouth.   Yes [provider]  cetirizine (ZYRTEC) 10 MG tablet Take 1 tablet (10 mg total) by mouth at bedtime. Patient taking differently: Take 10 mg by mouth every morning.  08/07/14  Yes [name], MD  EPIPEN 2-PAK 0.3 MG/0.3ML SOAJ injection Inject 0.3 mg as directed as needed (systemic reaction to seafood allergy).  08/22/14  Yes Scharlene Gloss, MD  ferrous sulfate 325 (65 FE) MG tablet Take 325 mg by mouth daily with breakfast.   Yes [provider]  fish oil-omega-3 fatty acids 1000 MG capsule Take 1 g by mouth daily.   Yes [provider]  fluticasone (FLONASE) 50 MCG/ACT nasal spray Place 2 sprays into both nostrils daily. Patient taking differently: Place 2 sprays into both nostrils daily as needed.  07/06/14  Yes [name], MD  gabapentin (NEURONTIN) 400 MG capsule Take 1 tab po qafternoon and 2 tabs po qhs Patient taking differently: Take 400 mg by mouth 3 (three) times daily. Take 1 caps po qafternoon and 2 caps po qhs 12/05/15  Yes [name], MD  ibuprofen (ADVIL,MOTRIN) 200 MG tablet Take 200 mg by mouth every 6 (six) hours as needed.   Yes [provider]  lamoTRIgine (LAMICTAL) 25 MG tablet  11/09/16  Yes [provider]  Multiple Vitamins-Minerals (MULTIVITAMIN WITH MINERALS) tablet Take 1 tablet by mouth  daily.   Yes [provider]  sertraline (ZOLOFT) 100 MG tablet sertraline 100 mg tablet   Yes [provider]  VYVANSE 30 MG capsule  11/09/16  Yes [provider]    Past Medical History:  Diagnosis Date  . Anxiety   . Arthritis   . Asthma   . Depression   . Fibromyalgia   . Iron deficiency anemia   . Kienbck's disease    LEFT WRIST  . Left wrist pain    retained deep implant    Past Surgical History:  Procedure Laterality Date  . LAPAROSCOPIC APPENDECTOMY N/A 11/04/2013   Procedure: APPENDECTOMY LAPAROSCOPIC;  Surgeon: Valarie Merino, MD;  Location: [facility];  Service: General;   Laterality: N/A;  . REMOVAL OF IMPLANT Left 06/13/2015   Procedure: LEFT WRIST DEEP IMPLANT REMOVAL ;  Surgeon: Bradly Bienenstock, MD;  Location: [facility];  Service: Orthopedics;  Laterality: Left;  . WRIST RECONSTRUCTION Left 04-15-2015    for Kienbock's disease     Social History  Substance Use Topics  . Smoking status: Former Smoker    Years: 5.00    Quit date: 04/06/1996  . Smokeless tobacco: Never Used  . Alcohol use 0.0 oz/week     Comment: occas.    Family History  Problem Relation Age of Onset  . Depression Mother   . Diabetes Mother   . Mental illness Mother   . COPD Father   . Mental illness Father   . Hypertension Father   . Hyperlipidemia Father   . Depression Maternal Grandmother   . Mental illness Maternal Grandmother   . Diabetes Maternal Grandfather   . Mental illness Maternal Grandfather   . Cancer Paternal Grandfather   . Mental illness Paternal Grandfather   . Mental illness Sister   . Mental illness Sister     Review of Systems  Constitutional: Negative for chills and fever.  HENT: Negative for sore throat.   Gastrointestinal: Negative for abdominal pain, nausea and vomiting.  Genitourinary: Negative for dysuria and hematuria.     OBJECTIVE:  Blood pressure 128/78, pulse (!) 130, temperature 98.6 F (37 C), temperature source Oral, resp. rate 18, height  (1.6 m), weight 194 lb 9.6 oz (88.3 kg), SpO2 98 %.  Physical Exam  AAOx3, NAD CVAT, no w/r/r RRR nl s1/s2, no m/r/g abd soft, + BS, NT/ND  Results for orders placed or performed in visit on 01/15/17 (from the past 24 hour(s))  POCT Wet + KOH Prep     Status: Abnormal   Collection Time: 01/15/17 10:41 AM  Result Value Ref Range   Yeast by KOH Absent Absent   Yeast by wet prep Absent Absent   WBC by wet prep Few Few   Clue Cells Wet Prep HPF POC Many (A) None   Trich by wet prep Absent Absent   Bacteria Wet Prep HPF POC Many (A) Few   Epithelial Cells By [employer] Pref ([facility])  None None, Few, Too numerous to count   RBC,UR,HPF,POC None None RBC/hpf    No results found.  ASSESSMENT and PLAN 1. Need for influenza vaccination - Flu Vaccine QUAD 36+ mos IM given today  2. Vaginal discharge - POCT Wet + KOH Prep - + BV  3. Screen for STD (sexually transmitted disease) Discussed importance of condoms use.  - GC/Chlamydia Probe Amp - HIV antibody (with reflex) - RPR  4. Bacterial vaginosis Discussed diagnosis, new me/r/se discussed.  - metroNIDAZOLE (METROGEL) 0.75 %  vaginal gel; Place 1 Applicatorful vaginally 2 (two) times daily.  Patient to fu with PCP for further discussion on depression  Return if symptoms worsen or fail to improve.    Myles Lipps, MD Primary Care at [facility] [address] [place], [place] [address] Ph.  [phone] Fax [phone]

## 2017-01-16 LAB — GC/CHLAMYDIA PROBE AMP
Chlamydia trachomatis, NAA: NEGATIVE
Neisseria gonorrhoeae by PCR: NEGATIVE

## 2017-01-17 LAB — HIV ANTIBODY (ROUTINE TESTING W REFLEX): HIV Screen 4th Generation wRfx: NONREACTIVE

## 2017-01-17 LAB — RPR: RPR Ser Ql: NONREACTIVE

## 2017-02-03 ENCOUNTER — Ambulatory Visit (INDEPENDENT_AMBULATORY_CARE_PROVIDER_SITE_OTHER): Payer: BLUE CROSS/BLUE SHIELD | Admitting: Family Medicine

## 2017-02-03 ENCOUNTER — Encounter: Payer: Self-pay | Admitting: Family Medicine

## 2017-02-03 VITALS — BP 118/72 | HR 98 | Temp 98.5°F | Resp 16 | Ht 62.99 in | Wt 202.0 lb

## 2017-02-03 DIAGNOSIS — M797 Fibromyalgia: Secondary | ICD-10-CM | POA: Diagnosis not present

## 2017-02-03 DIAGNOSIS — L503 Dermatographic urticaria: Secondary | ICD-10-CM | POA: Diagnosis not present

## 2017-02-03 DIAGNOSIS — R682 Dry mouth, unspecified: Secondary | ICD-10-CM

## 2017-02-03 LAB — POCT SKIN KOH: Skin KOH, POC: NEGATIVE

## 2017-02-03 MED ORDER — CHLORHEXIDINE GLUCONATE 0.12 % MT SOLN: 10.0000 mL | Freq: Two times a day (BID) | OROMUCOSAL | 0 refills | Status: DC

## 2017-02-03 NOTE — Patient Instructions (Addendum)
   IF you received an x-ray today, you will receive an invoice from [facility]. Please contact [facility] at [phone] with questions or concerns regarding your invoice.   IF you received labwork today, you will receive an invoice from [facility]. Please contact [facility] at [phone] with questions or concerns regarding your invoice.   Our billing staff will not be able to assist you with questions regarding bills from these companies.  You will be contacted with the lab results as soon as they are available. The fastest way to get your results is to activate your My Chart account. Instructions are located on the last page of this paperwork. If you have not heard from us regarding the results in 2 weeks, please contact this office.     Sexually Transmitted Disease A sexually transmitted disease (STD) is a disease or infection that may be passed (transmitted) from person to person, usually during sexual activity. This may happen by way of saliva, semen, blood, vaginal mucus, or urine. Common STDs include:  Gonorrhea.  Chlamydia.  Syphilis.  HIV and AIDS.  Genital herpes.  Hepatitis B and C.  Trichomonas.  Human papillomavirus (HPV).  Pubic lice.  Scabies.  Mites.  Bacterial vaginosis.  What are the causes? An STD may be caused by bacteria, a virus, or parasites. STDs are often transmitted during sexual activity if one person is infected. However, they may also be transmitted through nonsexual means. STDs may be transmitted after:  Sexual intercourse with an infected person.  Sharing sex toys with an infected person.  Sharing needles with an infected person or using unclean piercing or tattoo needles.  Having intimate contact with the genitals, mouth, or rectal areas of an infected person.  Exposure to infected fluids during birth.  What are the signs or symptoms? Different STDs have different symptoms. Some people may not have any  symptoms. If symptoms are present, they may include:  Painful or bloody urination.  Pain in the pelvis, abdomen, vagina, anus, throat, or eyes.  A skin rash, itching, or irritation.  Growths, ulcerations, blisters, or sores in the genital and anal areas.  Abnormal vaginal discharge with or without bad odor.  Penile discharge in men.  Fever.  Pain or bleeding during sexual intercourse.  Swollen glands in the groin area.  Yellow skin and eyes (jaundice). This is seen with hepatitis.  Swollen testicles.  Infertility.  Sores and blisters in the mouth.  How is this diagnosed? To make a diagnosis, your health care provider may:  Take a medical history.  Perform a physical exam.  Take a sample of any discharge to examine.  Swab the throat, cervix, opening to the penis, rectum, or vagina for testing.  Test a sample of your first morning urine.  Perform blood tests.  Perform a Pap test, if this applies.  Perform a colposcopy.  Perform a laparoscopy.  How is this treated? Treatment depends on the STD. Some STDs may be treated but not cured.  Chlamydia, gonorrhea, trichomonas, and syphilis can be cured with antibiotic medicine.  Genital herpes, hepatitis, and HIV can be treated, but not cured, with prescribed medicines. The medicines lessen symptoms.  Genital warts from HPV can be treated with medicine or by freezing, burning (electrocautery), or surgery. Warts may come back.  HPV cannot be cured with medicine or surgery. However, abnormal areas may be removed from the cervix, vagina, or vulva.  If your diagnosis is confirmed, your recent sexual partners need treatment. This is   true even if they are symptom-free or have a negative culture or evaluation. They should not have sex until their health care providers say it is okay.  Your health care provider may test you for infection again 3 months after treatment.  How is this prevented? Take these steps to reduce  your risk of getting an STD:  Use latex condoms, dental dams, and water-soluble lubricants during sexual activity. Do not use petroleum jelly or oils.  Avoid having multiple sex partners.  Do not have sex with someone who has other sex partners.  Do not have sex with anyone you do not know or who is at high risk for an STD.  Avoid risky sex practices that can break your skin.  Do not have sex if you have open sores on your mouth or skin.  Avoid drinking too much alcohol or taking illegal drugs. Alcohol and drugs can affect your judgment and put you in a vulnerable position.  Avoid engaging in oral and anal sex acts.  Get vaccinated for HPV and hepatitis. If you have not received these vaccines in the past, talk to your health care provider about whether one or both might be right for you.  If you are at risk of being infected with HIV, it is recommended that you take a prescription medicine daily to prevent HIV infection. This is called pre-exposure prophylaxis (PrEP). You are considered at risk if: ? You are a man who has sex with other men (MSM). ? You are a heterosexual man or woman and are sexually active with more than one partner. ? You take drugs by injection. ? You are sexually active with a partner who has HIV.  Talk with your health care provider about whether you are at high risk of being infected with HIV. If you choose to begin PrEP, you should first be tested for HIV. You should then be tested every 3 months for as long as you are taking PrEP.  Contact a health care provider if:  See your health care provider.  Tell your sexual partner(s). They should be tested and treated for any STDs.  Do not have sex until your health care provider says it is okay. Get help right away if: Contact your health care provider right away if:  You have severe abdominal pain.  You are a man and notice swelling or pain in your testicles.  You are a woman and notice swelling or pain  in your vagina.  This information is not intended to replace advice given to you by your health care provider. Make sure you discuss any questions you have with your health care provider. Document Released: 06/13/2002 Document Revised: 10/11/2015 Document Reviewed: 10/11/2012 Elsevier Interactive Patient Education  2018 Elsevier Inc.  

## 2017-02-03 NOTE — Progress Notes (Signed)
Subjective:  This chart was scribed for [name], MD by [name], at Primary Care at [facility].  This patient was seen in room 3 and the patient's care was started at 9:41 AM.   Chief Complaint  Patient presents with  . Fibromyalgia    follow-up  . Depression     Patient ID: Tonya Perry, female    DOB: 1976/03/24, 41 y.o.   MRN: [id number]  HPI HPI Comments: Tonya Perry is a 41 y.o. female who presents to Primary Care at [facility] for a follow up. Patient has a history of fibromyalgia and depression. She states that she is doing well with her fibromyalgia.  Her mood has been "better" than what it was in the past but still has some difficulties at times.  She denies any suicidal thoughts and states that her daughter is what helps her get through the days.  Patient eats breakfast and dinner, does not eat lunch.  She takes multivitamins, ran out of vitamin D supplements and has not taken any since.  She has not been walking/exercising but states that she has been "more promiscuous" recently (more than one partner).  She has not been using condoms and states that sleeping with men helps her get the attention she is looking for/ used to get from a past partner.  She does think that she may be acting out at times with her promiscuity.   She has had a partner who has an STI but states that they are only sexually active when he does not have an outbreak.  Patient smokes weed occasionally.    She has been having a "dry mouth" recently as well as a sore throat at times.  She also has a sore on the right corner of her mouth which broke out recently.  She has periodontal disease and feels like the dry mouth makes it worse.   She takes biotin.   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   She was treated for BV by Dr. Leretha Pol and states that she is now back to her "normal".    Patient had her hearing and is waiting to see what the verdict will be  within the next three months.  She is no longer taking iron supplements.     Past Medical History:  Diagnosis Date  . Anxiety   . Arthritis   . Asthma   . Depression   . Fibromyalgia   . Iron deficiency anemia   . Kienbck's disease    LEFT WRIST  . Left wrist pain    retained deep implant    Current Outpatient Prescriptions on File Prior to Visit  Medication Sig Dispense Refill  . acetaminophen (TYLENOL) 500 MG tablet Take 500 mg by mouth.    Marland Kitchen albuterol (PROAIR HFA) 108 (90 Base) MCG/ACT inhaler Inhale 2 puffs into the lungs as needed for wheezing (2 puffs as needed every 4-6h as needed for cough/wheeze). 1 Inhaler 5  . cetirizine (ZYRTEC) 10 MG tablet Take 1 tablet (10 mg total) by mouth at bedtime. (Patient taking differently: Take 10 mg by mouth every morning. ) 90 tablet 3  . EPIPEN 2-PAK 0.3 MG/0.3ML SOAJ injection Inject 0.3 mg as directed as needed (systemic reaction to seafood allergy).   1  . ferrous sulfate 325 (65 FE) MG tablet Take 325 mg by mouth daily with breakfast.    . fish oil-omega-3 fatty acids 1000 MG capsule Take 1 g  by mouth daily.    . fluticasone (FLONASE) 50 MCG/ACT nasal spray Place 2 sprays into both nostrils daily. (Patient taking differently: Place 2 sprays into both nostrils daily as needed. ) 16 g 11  . gabapentin (NEURONTIN) 400 MG capsule Take 1 tab po qafternoon and 2 tabs po qhs (Patient taking differently: Take 400 mg by mouth 3 (three) times daily. Take 1 caps po qafternoon and 2 caps po qhs) 270 capsule 1  . ibuprofen (ADVIL,MOTRIN) 200 MG tablet Take 200 mg by mouth every 6 (six) hours as needed.    . lamoTRIgine (LAMICTAL) 25 MG tablet 2 (two) times daily.     . Multiple Vitamins-Minerals (MULTIVITAMIN WITH MINERALS) tablet Take 1 tablet by mouth daily.    . sertraline (ZOLOFT) 100 MG tablet sertraline 100 mg tablet    . VYVANSE 30 MG capsule daily.      No current facility-administered medications on file prior to visit.     Allergies    Allergen Reactions  . Azithromycin Diarrhea  . Hydrocodone-Acetaminophen Itching  . Latex Itching  . Other Itching and Other (See Comments)    Peas, Pineapple, Mango, Melons, Cantelope, Watermelons.  Dust mites, trees, grass and pollen.  Causes throat to swell with itching. Tree nuts, causes throat to swell with itching.  . Shellfish Allergy Itching and Swelling  . Voltaren [Diclofenac Sodium] Other (See Comments)    Upset stomach; made pt feel like had to go to bathroom  . Penicillins Rash   Past Surgical History:  Procedure Laterality Date  . LAPAROSCOPIC APPENDECTOMY N/A 11/04/2013   Procedure: APPENDECTOMY LAPAROSCOPIC;  Surgeon: Valarie Merino, MD;  Location: [facility];  Service: General;  Laterality: N/A;  . REMOVAL OF IMPLANT Left 06/13/2015   Procedure: LEFT WRIST DEEP IMPLANT REMOVAL ;  Surgeon: Bradly Bienenstock, MD;  Location: [facility];  Service: Orthopedics;  Laterality: Left;  . WRIST RECONSTRUCTION Left 04-15-2015    for Kienbock's disease    Family History  Problem Relation Age of Onset  . Depression Mother   . Diabetes Mother   . Mental illness Mother   . COPD Father   . Mental illness Father   . Hypertension Father   . Hyperlipidemia Father   . Depression Maternal Grandmother   . Mental illness Maternal Grandmother   . Diabetes Maternal Grandfather   . Mental illness Maternal Grandfather   . Cancer Paternal Grandfather   . Mental illness Paternal Grandfather   . Mental illness Sister   . Mental illness Sister    Social History   Social History  . Marital status: Married    Spouse name: [name]  . Number of children: 1  . Years of education: Assoc   Occupational History  .      not employed   Social History Main Topics  . Smoking status: Former Smoker    Years: 5.00    Quit date: 04/06/1996  . Smokeless tobacco: Never Used  . Alcohol use 0.0 oz/week     Comment: occas.  . Drug use: No  . Sexual activity: Not Asked     Comment: 2015--  Placement Mirena IUD   Other Topics Concern  . None   Social History Narrative   Patient consumes 4-5 cups of caffeine daily.   Depression screen [facility] 2/9 02/03/2017 01/15/2017 11/14/2016 09/03/2016 02/25/2016  Decreased Interest 3 3 3 1 2   Down, Depressed, Hopeless 3 3 3 1 2   PHQ - 2 Score 6 6 6  2 4  Altered sleeping 3 3 3  (No Data) 2  Tired, decreased energy 1 3 3 1 2   Change in appetite 3 3 3 1 2   Feeling bad or failure about yourself  1 3 3 1 2   Trouble concentrating 3 3 3 1 2   Moving slowly or fidgety/restless 0 3 2 1 2   Suicidal thoughts 0 0 2 0 0  PHQ-9 Score 17 24 25  - 16  Difficult doing work/chores - - Extremely dIfficult Very difficult Very difficult  Some recent data might be hidden     Review of Systems  Constitutional: Negative for chills and fever.  HENT:       Dry mouth  Eyes: Negative for pain, redness and itching.  Respiratory: Negative for cough, choking and shortness of breath.   Gastrointestinal: Negative for nausea and vomiting.  Neurological: Negative for syncope and speech difficulty.  Psychiatric/Behavioral: Negative for suicidal ideas.       Objective:   Physical Exam  Constitutional: She is oriented to person, place, and time. She appears well-developed and well-nourished. No distress.  HENT:  Head: Normocephalic and atraumatic.  Ears retracted and injected, left worse than right, nares normal, oropharynx with some edema, pinpoint ulceration on the right corner of her lips.    Pulmonary/Chest: Effort normal and breath sounds normal. No respiratory distress.  Neurological: She is alert and oriented to person, place, and time.  Psychiatric:  Flat affect and depressed mood.     Vitals:   02/03/17 0918  BP: 118/72  Pulse: 98  Resp: 16  Temp: 98.5 F (36.9 C)  TempSrc: Oral  SpO2: 97%  Weight: 202 lb (91.6 kg)  Height: 5' 2.99" (1.6 m)   Results for orders placed or performed in visit on 02/03/17  POCT Skin KOH  Result Value Ref Range     Skin KOH, POC Negative Negative         Assessment & Plan:   1. Fibromyalgia   2. Dermatographism   3. Dry mouth     Orders Placed This Encounter  Procedures  . POCT Skin KOH    Meds ordered this encounter  Medications  . chlorhexidine (PERIDEX) 0.12 % solution    Sig: Use as directed 10 mLs in the mouth or throat 2 (two) times daily.    Dispense:  500 mL    Refill:  0    I personally performed the services described in this documentation, which was scribed in my presence. The recorded information has been reviewed and considered, and addended by me as needed.   Norberto SorensonEva Hasnain Manheim, M.D.  Primary Care at [facility]  [place] [address] [place], [place] [address] [phone] phone [phone] fax  02/05/17 12:41 AM

## 2017-04-27 ENCOUNTER — Encounter: Payer: Self-pay | Admitting: Physician Assistant

## 2017-04-27 ENCOUNTER — Ambulatory Visit: Payer: BLUE CROSS/BLUE SHIELD | Admitting: Physician Assistant

## 2017-04-27 ENCOUNTER — Other Ambulatory Visit: Payer: Self-pay

## 2017-04-27 VITALS — BP 112/76 | HR 105 | Temp 98.7°F | Resp 18 | Ht 62.99 in | Wt 210.4 lb

## 2017-04-27 DIAGNOSIS — F32A Depression, unspecified: Secondary | ICD-10-CM

## 2017-04-27 DIAGNOSIS — F329 Major depressive disorder, single episode, unspecified: Secondary | ICD-10-CM

## 2017-04-27 DIAGNOSIS — Z7251 High risk heterosexual behavior: Secondary | ICD-10-CM

## 2017-04-27 DIAGNOSIS — Z789 Other specified health status: Secondary | ICD-10-CM

## 2017-04-27 DIAGNOSIS — B9689 Other specified bacterial agents as the cause of diseases classified elsewhere: Secondary | ICD-10-CM | POA: Diagnosis not present

## 2017-04-27 DIAGNOSIS — Z7289 Other problems related to lifestyle: Secondary | ICD-10-CM

## 2017-04-27 DIAGNOSIS — N760 Acute vaginitis: Secondary | ICD-10-CM | POA: Diagnosis not present

## 2017-04-27 DIAGNOSIS — N898 Other specified noninflammatory disorders of vagina: Secondary | ICD-10-CM

## 2017-04-27 LAB — POCT WET + KOH PREP
Trich by wet prep: ABSENT
Yeast by KOH: ABSENT
Yeast by wet prep: ABSENT

## 2017-04-27 MED ORDER — CLINDAMYCIN PHOSPHATE 2 % VA CREA: 1.0000 | TOPICAL_CREAM | Freq: Every day | VAGINAL | 0 refills | Status: AC

## 2017-04-27 NOTE — Patient Instructions (Addendum)
We are going to attempt using a different medication for treatment of BV.  Please use as directed.  It is very important to avoid sexual activity while being treated for BV.  Please read below for other information on how to prevent recurrence of BV.  If after 7 days of treatment you do not feel like your symptoms have resolved, please return to office for repeat wet prep.  We have also collected additional labs today and should have those results back within a week.  We will contact you with these results.  Bacterial Vaginosis Bacterial vaginosis is an infection of the vagina. It happens when too many germs (bacteria) grow in the vagina. This infection puts you at risk for infections from sex (STIs). Treating this infection can lower your risk for some STIs. You should also treat this if you are pregnant. It can cause your baby to be born early. Follow these instructions at home: Medicines  Take over-the-counter and prescription medicines only as told by your doctor.  Take or use your antibiotic medicine as told by your doctor. Do not stop taking or using it even if you start to feel better. General instructions  If you your sexual partner is a woman, tell her that you have this infection. She needs to get treatment if she has symptoms. If you have a female partner, he does not need to be treated.  During treatment: ? Avoid sex. ? Do not douche. ? Avoid alcohol as told. ? Avoid breastfeeding as told.  Drink enough fluid to keep your pee (urine) clear or pale yellow.  Keep your vagina and butt (rectum) clean. ? Wash the area with warm water every day. ? Wipe from front to back after you use the toilet.  Keep all follow-up visits as told by your doctor. This is important. Preventing this condition  Do not douche.  Use only warm water to wash around your vagina.  Use protection when you have sex. This includes: ? Latex condoms. ? Dental dams.  Limit how many people you have sex with.  It is best to only have sex with the same person (be monogamous).  Get tested for STIs. Have your partner get tested.  Wear underwear that is cotton or lined with cotton.  Avoid tight pants and pantyhose. This is most important in summer.  Do not use any products that have nicotine or tobacco in them. These include cigarettes and e-cigarettes. If you need help quitting, ask your doctor.  Do not use illegal drugs.  Limit how much alcohol you drink. Contact a doctor if:  Your symptoms do not get better, even after you are treated.  You have more discharge or pain when you pee (urinate).  You have a fever.  You have pain in your belly (abdomen).  You have pain with sex.  Your bleed from your vagina between periods. Summary  This infection happens when too many germs (bacteria) grow in the vagina.  Treating this condition can lower your risk for some infections from sex (STIs).  You should also treat this if you are pregnant. It can cause early (premature) birth.  Do not stop taking or using your antibiotic medicine even if you start to feel better. This information is not intended to replace advice given to you by your health care provider. Make sure you discuss any questions you have with your health care provider. Document Released: 12/31/2007 Document Revised: 12/07/2015 Document Reviewed: 12/07/2015 Elsevier Interactive Patient Education  2017 Elsevier  Inc.    IF you received an x-ray today, you will receive an invoice from [facility]. Please contact [facility] at [phone] with questions or concerns regarding your invoice.   IF you received labwork today, you will receive an invoice from [facility]. Please contact [facility] at [phone] with questions or concerns regarding your invoice.   Our billing staff will not be able to assist you with questions regarding bills from these companies.  You will be contacted with the lab results as soon as  they are available. The fastest way to get your results is to activate your My Chart account. Instructions are located on the last page of this paperwork. If you have not heard from [place] regarding the results in 2 weeks, please contact this office.

## 2017-04-27 NOTE — Progress Notes (Signed)
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  SUBJECTIVE  Tonya Perry is a 42 y.o. female who complains of vaginal discharge and vaginal odor x 3 months. Has been dx twice in the past 2 months with BV, given Rx for vaginal metrogel with no full relief of symptoms. Notes she does not get Rx for oral flagyl as she drinks alcohol daily and is unwilling to stop for treatment. She is sexually active. Has had ~5 new partners since her last STD screen. Does not use condoms.  She denies dysuria, hematuria, urinary frequency, urinary urgency, abdominal pain, pelvic pain and vaginal itching. Has tried two rounds of metgrogel with no relief. She has remained sexually active during treatment both times. Denies use of silk underwear and tight clothing. Has IUD placed.   Depression screening was a 20.  In terms of depression, patient notes that she has major depressive disorder.  She is taking Zoloft and Lamictal.  She has both a psychiatrist and psychologist.  She has an appoint with her therapist today. Her next appointment with psychiatry is February 8.   She has been having suicidal thoughts but recently but has no active plan.  She is very depressed because her husband is leaving her.  She  has a past medical history of Anxiety, Arthritis, Asthma, Depression, Fibromyalgia, Iron deficiency anemia, Kienbck's disease, and Left wrist pain.    Medications reviewed and updated by myself where necessary, and exist elsewhere in the encounter.   Ms. Curet is allergic to azithromycin; hydrocodone-acetaminophen;  latex; other; shellfish allergy; voltaren [diclofenac sodium]; and penicillins. She  reports that she quit smoking about 21 years ago. She quit after 5.00 years of use. she has never used smokeless tobacco. She reports that she drinks alcohol. She reports that she does not use drugs. She  has no sexual activity history on file. The patient  has a past surgical history that includes laparoscopic appendectomy (N/A, 11/04/2013); Wrist reconstruction (Left, 04-15-2015); and Removal of implant (Left, 06/13/2015).  Her family history includes COPD in her father; Cancer in her paternal grandfather; Depression in her maternal grandmother and mother; Diabetes in her maternal grandfather and mother; Hyperlipidemia in her father; Hypertension in her father; Mental illness in her father, maternal grandfather, maternal grandmother, mother, paternal grandfather, sister, and sister.  Review of Systems  Constitutional: Negative for chills, diaphoresis and fever.  Gastrointestinal: Negative for nausea and vomiting.    OBJECTIVE  Her  height is 5' 2.99" (1.6 m) and weight is 210 lb 6.4 oz (95.4 kg). Her oral temperature is 98.7 F (37.1 C). Her blood pressure is 112/76 and her pulse is 105 (abnormal). Her respiration is 18 and oxygen saturation is 99%.  The patient's body mass index is 37.28 kg/m.  Physical Exam  Constitutional: She is oriented to person, place, and time. She appears well-developed and well-nourished.  HENT:  Head: Normocephalic and atraumatic.  Eyes: Conjunctivae are normal.  Neck: Normal range of motion.  Pulmonary/Chest: Effort normal.  Abdominal: Soft. Normal appearance. There is no tenderness.  Genitourinary: Uterus is not enlarged and not tender. Cervix exhibits no motion tenderness. Right adnexum displays no mass and no tenderness. Left adnexum displays no mass  and no tenderness. No tenderness in the vagina. Vaginal discharge (moderate amount of creamy yellowish discharge with fishy odor)  found.  Neurological: She is alert and oriented to person, place, and time.  Skin: Skin is warm and dry.  Psychiatric: She has a normal mood and affect.  Vitals reviewed.   Results for orders placed or performed in visit on 04/27/17 (from the past 24 hour(s))  POCT Wet + KOH Prep     Status: Abnormal   Collection Time: 04/27/17 10:07 AM  Result Value Ref Range   Yeast by KOH Absent Absent   Yeast by wet prep Absent Absent   WBC by wet prep Many (A) Few   Clue Cells Wet Prep HPF POC Too numerous to count  (A) None   Trich by wet prep Absent Absent   Bacteria Wet Prep HPF POC Too numerous to count  (A) Few   Epithelial Cells By [employer]Wet Pref ([facility]) None None, Few, Too numerous to count   RBC,UR,HPF,POC None None RBC/hpf    ASSESSMENT & PLAN  [name] was seen today for vaginal discharge and depression.  Diagnoses and all orders for this visit:  Vaginal discharge -     POCT Wet + KOH Prep  Vaginal odor -     POCT Wet + KOH Prep  High risk heterosexual behavior -     Hepatitis panel, acute -     RPR -     HIV antibody -     GC/Chlamydia Probe Amp -     POCT Wet + KOH Prep  Bacterial vaginosis -     clindamycin (CLEOCIN) 2 % vaginal cream; Place 1 Applicatorful vaginally at bedtime for 7 days.  Depression, unspecified depression type  Alcohol use    Patient reports that she cannot stop drinking alcohol for any period of time and can therefore not tolerate oral Flagyl.  Will attempt vaginal clindamycin cream at this time as she has not responded to vaginal MetroGel.  Strongly encouraged to avoid sexual activity during treatment.  She is unsure if she can do this.   Given educational material on prevention of bacterial vaginosis.  If no improvement in vaginal symptoms and 7 days, return to clinic for repeat wet prep.  Additional labs pending.  In terms of depression, she is not actively suicidal today and does not have suicidal plan.  She has an appointment later this afternoon with  her therapist and I strongly recommended she go to this appointment.  Educated patient that if she develops any active suicidal thoughts or suicidal plans, to seek care immediately at the ED.The patient was advised to call or come back to clinic if she does not see an improvement in symptoms, or worsens with the above plan.   Benjiman CoreBrittany Wiseman, PA-C  [facility] at [facility] 04/27/2017 10:40 AM

## 2017-04-28 LAB — HEPATITIS PANEL, ACUTE
HEP A IGM: NEGATIVE
HEP B C IGM: NEGATIVE
HEP B S AG: NEGATIVE

## 2017-04-28 LAB — RPR: RPR: NONREACTIVE

## 2017-04-28 LAB — GC/CHLAMYDIA PROBE AMP
Chlamydia trachomatis, NAA: NEGATIVE
Neisseria gonorrhoeae by PCR: NEGATIVE

## 2017-04-28 LAB — HIV ANTIBODY (ROUTINE TESTING W REFLEX): HIV Screen 4th Generation wRfx: NONREACTIVE

## 2017-07-01 ENCOUNTER — Ambulatory Visit: Payer: BLUE CROSS/BLUE SHIELD | Admitting: Family Medicine

## 2017-07-01 ENCOUNTER — Other Ambulatory Visit: Payer: Self-pay

## 2017-07-01 ENCOUNTER — Encounter: Payer: Self-pay | Admitting: Family Medicine

## 2017-07-01 VITALS — BP 126/87 | HR 105 | Temp 97.7°F | Resp 16 | Ht 62.99 in | Wt 208.0 lb

## 2017-07-01 DIAGNOSIS — E611 Iron deficiency: Secondary | ICD-10-CM

## 2017-07-01 DIAGNOSIS — R7989 Other specified abnormal findings of blood chemistry: Secondary | ICD-10-CM | POA: Diagnosis not present

## 2017-07-01 DIAGNOSIS — F3341 Major depressive disorder, recurrent, in partial remission: Secondary | ICD-10-CM | POA: Diagnosis not present

## 2017-07-01 DIAGNOSIS — Z6836 Body mass index (BMI) 36.0-36.9, adult: Secondary | ICD-10-CM | POA: Diagnosis not present

## 2017-07-01 DIAGNOSIS — Z5181 Encounter for therapeutic drug level monitoring: Secondary | ICD-10-CM | POA: Diagnosis not present

## 2017-07-01 DIAGNOSIS — M797 Fibromyalgia: Secondary | ICD-10-CM | POA: Diagnosis not present

## 2017-07-01 DIAGNOSIS — G894 Chronic pain syndrome: Secondary | ICD-10-CM | POA: Diagnosis not present

## 2017-07-01 MED ORDER — GABAPENTIN 600 MG PO TABS: ORAL_TABLET | ORAL | 1 refills | Status: DC

## 2017-07-01 NOTE — Patient Instructions (Addendum)
Other medicines to treat your fibromyalgia include Savella (would have to talk to Ms. Kizzie BaneHughes about using it with or decreasing your dose of zolfot/sertraline) and/or amitriptyline (which would be used in place of trazodone).    IF you received an x-ray today, you will receive an [facility]. Please contact [facility] at [phone] with questions or concerns regarding your invoice.   IF you received labwork today, you will receive an invoice from [facility]. Please contact [facility] at [phone] with questions or concerns regarding your invoice.   Our billing staff will not be able to assist you with questions regarding bills from these companies.  You will be contacted with the lab results as soon as they are available. The fastest way to get your results is to activate your My Chart account. Instructions are located on the last page of this paperwork. If you have not heard from us regarding the results in 2 weeks, please contact this office.      Myofascial Pain Syndrome and Fibromyalgia Myofascial pain syndrome and fibromyalgia are both pain disorders. This pain may be felt mainly in your muscles.  Myofascial pain syndrome: ? Always has trigger points or tender points in the muscle that will cause pain when pressed. The pain may come and go. ? Usually affects your neck, upper back, and shoulder areas. The pain often radiates into your arms and hands.  Fibromyalgia: ? Has muscle pains and tenderness that come and go. ? Is often associated with fatigue and sleep disturbances. ? Has trigger points. ? Tends to be long-lasting (chronic), but is not life-threatening.  Fibromyalgia and myofascial pain are not the same. However, they often occur together. If you have both conditions, each can make the other worse. Both are common and can cause enough pain and fatigue to make day-to-day activities difficult. What are the causes? The exact causes of  fibromyalgia and myofascial pain are not known. People with certain gene types may be more likely to develop fibromyalgia. Some factors can be triggers for both conditions, such as:  Spine disorders.  Arthritis.  Severe injury (trauma) and other physical stressors.  Being under a lot of stress.  A medical illness.  What are the signs or symptoms? Fibromyalgia The main symptom of fibromyalgia is widespread pain and tenderness in your muscles. This can vary over time. Pain is sometimes described as stabbing, shooting, or burning. You may have tingling or numbness, too. You may also have sleep problems and fatigue. You may wake up feeling tired and groggy (fibro fog). Other symptoms may include:  Bowel and bladder problems.  Headaches.  Visual problems.  Problems with odors and noises.  Depression or mood changes.  Painful menstrual periods (dysmenorrhea).  Dry skin or eyes.  Myofascial pain syndrome Symptoms of myofascial pain syndrome include:  Tight, ropy bands of muscle.  Uncomfortable sensations in muscular areas, such as: ? Aching. ? Cramping. ? Burning. ? Numbness. ? Tingling. ? Muscle weakness.  Trouble moving certain muscles freely (range of motion).  How is this diagnosed? There are no specific tests to diagnose fibromyalgia or myofascial pain syndrome. Both can be hard to diagnose because their symptoms are common in many other conditions. Your health care provider may suspect one or both of these conditions based on your symptoms and medical history. Your health care provider will also do a physical exam. The key to diagnosing fibromyalgia is having pain, fatigue, and other symptoms for more than three months that cannot be explained  by another condition. The key to diagnosing myofascial pain syndrome is finding trigger points in muscles that are tender and cause pain elsewhere in your body (referred pain). How is this treated? Treating fibromyalgia and  myofascial pain often requires a team of health care providers. This usually starts with your primary provider and a physical therapist. You may also find it helpful to work with alternative health care providers, such as massage therapists or acupuncturists. Treatment for fibromyalgia may include medicines. This may include nonsteroidal anti-inflammatory drugs (NSAIDs), along with other medicines. Treatment for myofascial pain may also include:  NSAIDs.  Cooling and stretching of muscles.  Trigger point injections.  Sound wave (ultrasound) treatments to stimulate muscles.  Follow these instructions at home:  Take medicines only as directed by your health care provider.  Exercise as directed by your health care provider or physical therapist.  Try to avoid stressful situations.  Practice relaxation techniques to control your stress. You may want to try: ? Biofeedback. ? Visual imagery. ? Hypnosis. ? Muscle relaxation. ? Yoga. ? Meditation.  Talk to your health care provider about alternative treatments, such as acupuncture or massage treatment.  Maintain a healthy lifestyle. This includes eating a healthy diet and getting enough sleep.  Consider joining a support group.  Do not do activities that stress or strain your muscles. That includes repetitive motions and heavy lifting. Where to find more information:  National Fibromyalgia Association: www.fmaware.org  Arthritis Foundation: www.arthritis.org  American Chronic Pain Association: GumSearch.nl Contact a health care provider if:  You have new symptoms.  Your symptoms get worse.  You have side effects from your medicines.  You have trouble sleeping.  Your condition is causing depression or anxiety. This information is not intended to replace advice given to you by your health care provider. Make sure you discuss any questions you have with your health care provider. Document  Released: 03/23/2005 Document Revised: 08/29/2015 Document Reviewed: 12/27/2013 Elsevier Interactive Patient Education  [employer].

## 2017-07-01 NOTE — Progress Notes (Signed)
Subjective:  By signing my name below, I, [name], attest that this documentation has been prepared under the direction and in the presence of [name], MD Electronically Signed: [name], ED Scribe 07/01/2017 at 10:14 AM.   Patient ID: Tonya Perry, female    DOB: Oct 06, 1975, 42 y.o.   MRN: [id number]  Chief Complaint  Patient presents with  . Fibromyalgia    follow up    HPI Tonya Perry is a 42 y.o. female who presents to Primary Care at [facility] for f/u on fibromyalgia. Pt states flare-up has returned in the past month. She is currently taking 1 Lamictal tid and gabapentin 400 mg 1 tab in the afternoon, 2 tabs at night without any relief. She has not exercised within the past 3-4 months. She is still followed by Tamela Oddi. Pt has failed flexeril, cymbalta with worsened mood symptoms, lyrica which she tried for less than 2 wks and made her more depressed, amitriptyline which she tried for less than 2 wks without any noted side-effects.  Pt is taking 1.5 tabs of trazodone, unknown dose, nightly to assist with sleep. She states she still wakes 2-3 times throughout the night. Also reports that trazodone helps with anxiety and depression.  Pt reports occasional spotting with IUD.  Past Medical History:  Diagnosis Date  . Anxiety   . Arthritis   . Asthma   . Depression   . Fibromyalgia   . Iron deficiency anemia   . Kienbck's disease    LEFT WRIST  . Left wrist pain    retained deep implant   Current Outpatient Medications on File Prior to Visit  Medication Sig Dispense Refill  . acetaminophen (TYLENOL) 500 MG tablet Take 500 mg by mouth.    Marland Kitchen albuterol (PROAIR HFA) 108 (90 Base) MCG/ACT inhaler Inhale 2 puffs into the lungs as needed for wheezing (2 puffs as needed every 4-6h as needed for cough/wheeze). 1 Inhaler 5  . cetirizine (ZYRTEC) 10 MG tablet Take 1 tablet (10 mg total) by mouth at bedtime. (Patient taking differently: Take 10 mg by mouth every  morning. ) 90 tablet 3  . chlorhexidine (PERIDEX) 0.12 % solution Use as directed 10 mLs in the mouth or throat 2 (two) times daily. 500 mL 0  . EPIPEN 2-PAK 0.3 MG/0.3ML SOAJ injection Inject 0.3 mg as directed as needed (systemic reaction to seafood allergy).   1  . fish oil-omega-3 fatty acids 1000 MG capsule Take 1 g by mouth daily.    . fluticasone (FLONASE) 50 MCG/ACT nasal spray Place 2 sprays into both nostrils daily. (Patient taking differently: Place 2 sprays into both nostrils daily as needed. ) 16 g 11  . gabapentin (NEURONTIN) 400 MG capsule Take 1 tab po qafternoon and 2 tabs po qhs (Patient taking differently: Take 400 mg by mouth 3 (three) times daily. Take 1 caps po qafternoon and 2 caps po qhs) 270 capsule 1  . ibuprofen (ADVIL,MOTRIN) 200 MG tablet Take 200 mg by mouth every 6 (six) hours as needed.    . lamoTRIgine (LAMICTAL) 25 MG tablet 2 (two) times daily.     . Multiple Vitamins-Minerals (MULTIVITAMIN WITH MINERALS) tablet Take 1 tablet by mouth daily.    . sertraline (ZOLOFT) 100 MG tablet sertraline 100 mg tablet    . VYVANSE 30 MG capsule daily.      No current facility-administered medications on file prior to visit.     Past Surgical History:  Procedure Laterality Date  .  LAPAROSCOPIC APPENDECTOMY N/A 11/04/2013   Procedure: APPENDECTOMY LAPAROSCOPIC;  Surgeon: Valarie MerinoMatthew B Martin, MD;  Location: [facility];  Service: General;  Laterality: N/A;  . REMOVAL OF IMPLANT Left 06/13/2015   Procedure: LEFT WRIST DEEP IMPLANT REMOVAL ;  Surgeon: Bradly BienenstockFred Ortmann, MD;  [facility];  Service: Orthopedics;  Laterality: Left;  . WRIST RECONSTRUCTION Left 04-15-2015    for Kienbock's disease     Allergies  Allergen Reactions  . Azithromycin Diarrhea  . Hydrocodone-Acetaminophen Itching  . Latex Itching  . Other Itching and Other (See Comments)    Peas, Pineapple, Mango, Melons, Cantelope, Watermelons.  Dust mites, trees, grass and pollen.  Causes throat to  swell with itching. Tree nuts, causes throat to swell with itching.  . Shellfish Allergy Itching and Swelling  . Voltaren [Diclofenac Sodium] Other (See Comments)    Upset stomach; made pt feel like had to go to bathroom  . Penicillins Rash   Family History  Problem Relation Age of Onset  . Depression Mother   . Diabetes Mother   . Mental illness Mother   . COPD Father   . Mental illness Father   . Hypertension Father   . Hyperlipidemia Father   . Depression Maternal Grandmother   . Mental illness Maternal Grandmother   . Diabetes Maternal Grandfather   . Mental illness Maternal Grandfather   . Cancer Paternal Grandfather   . Mental illness Paternal Grandfather   . Mental illness Sister   . Mental illness Sister    Social History   Socioeconomic History  . Marital status: Married    Spouse name: [name]  . Number of children: 1  . Years of education: Assoc  . Highest education level: Not on file  Occupational History    Comment: not employed  Social Needs  . Financial resource strain: Not on file  . Food insecurity:    Worry: Not on file    Inability: Not on file  . Transportation needs:    Medical: Not on file    Non-medical: Not on file  Tobacco Use  . Smoking status: Former Smoker    Years: 5.00    Last attempt to quit: 04/06/1996    Years since quitting: 21.4  . Smokeless tobacco: Never Used  Substance and Sexual Activity  . Alcohol use: Yes    Alcohol/week: 0.0 oz    Comment: occas.  . Drug use: No  . Sexual activity: Not on file    Comment: 2015-- Placement Mirena IUD  Lifestyle  . Physical activity:    Days per week: Not on file    Minutes per session: Not on file  . Stress: Not on file  Relationships  . Social connections:    Talks on phone: Not on file    Gets together: Not on file    Attends religious service: Not on file    Active member of club or organization: Not on file    Attends meetings of clubs or organizations: Not on file     Relationship status: Not on file  Other Topics Concern  . Not on file  Social History Narrative   Patient consumes 4-5 cups of caffeine daily.   [facility] 2[facility] 09/06/2017 07/01/2017 04/27/2017 02/03/2017 01/15/2017  Decreased Interest 2 3 3 3 3   Down, Depressed, Hopeless 2 2 3 3 3   PHQ - 2 Score 4 5 6 6 6   Altered sleeping 0 3 3 3 3   Tired, decreased energy 1  3 3 1 3   Change in appetite 2 3 1 3 3   Feeling bad or failure about yourself  3 3 1 1 3   Trouble concentrating 3 3 3 3 3   Moving slowly or fidgety/restless 1 2 3  0 3  Suicidal thoughts 1 2 0 0 0  PHQ-9 Score 15 24 20 17 24   Difficult doing work/chores Very difficult Very difficult - - -  Some recent data might be hidden     Review of Systems  Musculoskeletal: Positive for myalgias.  Psychiatric/Behavioral: Positive for sleep disturbance.      Objective:   Physical Exam  Constitutional: She is oriented to person, place, and time. She appears well-developed and well-nourished. No distress.  HENT:  Head: Normocephalic and atraumatic.  Eyes: Conjunctivae and EOM are normal.  Neck: Neck supple. No tracheal deviation present.  Cardiovascular: Normal rate and regular rhythm.  Pulmonary/Chest: Effort normal and breath sounds normal. No respiratory distress.  Musculoskeletal: Normal range of motion.  Neurological: She is alert and oriented to person, place, and time.  Skin: Skin is warm and dry.  Psychiatric: She has a normal mood and affect. Her behavior is normal.  Nursing note and vitals reviewed.  BP 126/87   Pulse (!) 105   Temp 97.7 F (36.5 C)   Resp 16   Ht 5' 2.99" (1.6 m)   Wt 208 lb (94.3 kg)   SpO2 100%   BMI 36.86 kg/m     Assessment & Plan:   1. Fibromyalgia   2. Recurrent major depressive disorder, in partial remission (HCC)   3. Medication monitoring encounter   4. Chronic pain syndrome   5. Class 2 severe obesity due to excess calories with serious comorbidity and body mass index (BMI)  of 36.0 to 36.9 in adult (HCC)   6. Iron deficiency   7. Elevated ferritin level     Orders Placed This Encounter  Procedures  . CBC with Differential/Platelet  . Comprehensive metabolic panel  . TSH  . Vitamin B12  . VITAMIN D 25 Hydroxy (Vit-D Deficiency, Fractures)  . Hemoglobin A1c  . Ferritin  . Iron and TIBC([facility])    Meds ordered this encounter  Medications  . gabapentin (NEURONTIN) 600 MG tablet    Sig: Take 1 tab po qam and 1 tab po qafternoon and 2 tabs po qhs    Dispense:  360 tablet    Refill:  1    D/c prior rx for gabapentin 400 cap    I personally performed the services described in this documentation, which was scribed in my presence. The recorded information has been reviewed and considered, and addended by me as needed.   [name], M.D.  Primary Care at [facility] [address] [place], [place] [address] [phone] phone [phone] fax  09/06/17 4:47 PM

## 2017-07-02 LAB — CBC WITH DIFFERENTIAL/PLATELET
BASOS ABS: 0.1 10*3/uL (ref 0.0–0.2)
Basos: 1 %
EOS (ABSOLUTE): 0.2 10*3/uL (ref 0.0–0.4)
Eos: 2 %
Hematocrit: 40.8 % (ref 34.0–46.6)
Hemoglobin: 13.9 g/dL (ref 11.1–15.9)
IMMATURE GRANS (ABS): 0 10*3/uL (ref 0.0–0.1)
IMMATURE GRANULOCYTES: 0 %
LYMPHS: 26 %
Lymphocytes Absolute: 2.2 10*3/uL (ref 0.7–3.1)
MCH: 31.2 pg (ref 26.6–33.0)
MCHC: 34.1 g/dL (ref 31.5–35.7)
MCV: 92 fL (ref 79–97)
Monocytes Absolute: 0.6 10*3/uL (ref 0.1–0.9)
Monocytes: 7 %
NEUTROS PCT: 64 %
Neutrophils Absolute: 5.5 10*3/uL (ref 1.4–7.0)
PLATELETS: 360 10*3/uL (ref 150–379)
RBC: 4.46 x10E6/uL (ref 3.77–5.28)
RDW: 13.4 % (ref 12.3–15.4)
WBC: 8.4 10*3/uL (ref 3.4–10.8)

## 2017-07-02 LAB — COMPREHENSIVE METABOLIC PANEL
ALT: 17 IU/L (ref 0–32)
AST: 15 IU/L (ref 0–40)
Albumin/Globulin Ratio: 1.6 (ref 1.2–2.2)
Albumin: 4.6 g/dL (ref 3.5–5.5)
Alkaline Phosphatase: 85 IU/L (ref 39–117)
BUN/Creatinine Ratio: 17 (ref 9–23)
BUN: 13 mg/dL (ref 6–24)
Bilirubin Total: <0.2 mg/dL (ref 0.0–1.2)
CALCIUM: 9.4 mg/dL (ref 8.7–10.2)
CHLORIDE: 105 mmol/L (ref 96–106)
CO2: 21 mmol/L (ref 20–29)
Creatinine, Ser: 0.77 mg/dL (ref 0.57–1.00)
GFR calc non Af Amer: 96 mL/min/{1.73_m2} (ref >59)
GFR, EST AFRICAN AMERICAN: 111 mL/min/{1.73_m2} (ref >59)
GLUCOSE: 87 mg/dL (ref 65–99)
Globulin, Total: 2.8 g/dL (ref 1.5–4.5)
Potassium: 4.3 mmol/L (ref 3.5–5.2)
Sodium: 140 mmol/L (ref 134–144)
TOTAL PROTEIN: 7.4 g/dL (ref 6.0–8.5)

## 2017-07-02 LAB — HEMOGLOBIN A1C
Est. average glucose Bld gHb Est-mCnc: 97 mg/dL
HEMOGLOBIN A1C: 5 % (ref 4.8–5.6)

## 2017-07-02 LAB — VITAMIN B12: VITAMIN B 12: 352 pg/mL (ref 232–1245)

## 2017-07-02 LAB — FERRITIN: Ferritin: 172 ng/mL — ABNORMAL HIGH (ref 15–150)

## 2017-07-02 LAB — IRON AND TIBC
Iron Saturation: 27 % (ref 15–55)
Iron: 79 ug/dL (ref 27–159)
TIBC: 298 ug/dL (ref 250–450)
UIBC: 219 ug/dL (ref 131–425)

## 2017-07-02 LAB — TSH: TSH: 1.24 u[IU]/mL (ref 0.450–4.500)

## 2017-07-02 LAB — VITAMIN D 25 HYDROXY (VIT D DEFICIENCY, FRACTURES): VIT D 25 HYDROXY: 26 ng/mL — AB (ref 30.0–100.0)

## 2017-07-22 DIAGNOSIS — F1020 Alcohol dependence, uncomplicated: Secondary | ICD-10-CM | POA: Insufficient documentation

## 2017-09-06 ENCOUNTER — Other Ambulatory Visit: Payer: Self-pay

## 2017-09-06 ENCOUNTER — Ambulatory Visit: Payer: BLUE CROSS/BLUE SHIELD | Admitting: Family Medicine

## 2017-09-06 ENCOUNTER — Encounter: Payer: Self-pay | Admitting: Family Medicine

## 2017-09-06 VITALS — BP 114/74 | HR 95 | Temp 99.0°F | Resp 16 | Ht 63.0 in | Wt 219.0 lb

## 2017-09-06 DIAGNOSIS — B372 Candidiasis of skin and nail: Secondary | ICD-10-CM

## 2017-09-06 DIAGNOSIS — L304 Erythema intertrigo: Secondary | ICD-10-CM

## 2017-09-06 DIAGNOSIS — Z5181 Encounter for therapeutic drug level monitoring: Secondary | ICD-10-CM | POA: Diagnosis not present

## 2017-09-06 DIAGNOSIS — N898 Other specified noninflammatory disorders of vagina: Secondary | ICD-10-CM

## 2017-09-06 DIAGNOSIS — Z8619 Personal history of other infectious and parasitic diseases: Secondary | ICD-10-CM

## 2017-09-06 DIAGNOSIS — Z113 Encounter for screening for infections with a predominantly sexual mode of transmission: Secondary | ICD-10-CM

## 2017-09-06 DIAGNOSIS — Z79899 Other long term (current) drug therapy: Secondary | ICD-10-CM | POA: Diagnosis not present

## 2017-09-06 HISTORY — DX: Personal history of other infectious and parasitic diseases: Z86.19

## 2017-09-06 LAB — POCT URINALYSIS DIP (MANUAL ENTRY)
BILIRUBIN UA: NEGATIVE
Glucose, UA: NEGATIVE mg/dL
Ketones, POC UA: NEGATIVE mg/dL
LEUKOCYTES UA: NEGATIVE
NITRITE UA: NEGATIVE
Protein Ur, POC: NEGATIVE mg/dL
Spec Grav, UA: 1.02 (ref 1.010–1.025)
Urobilinogen, UA: 0.2 E.U./dL
pH, UA: 6.5 (ref 5.0–8.0)

## 2017-09-06 LAB — POCT WET + KOH PREP
Trich by wet prep: ABSENT
YEAST BY KOH: ABSENT
Yeast by wet prep: ABSENT

## 2017-09-06 LAB — POC MICROSCOPIC URINALYSIS (UMFC): Mucus: ABSENT

## 2017-09-06 MED ORDER — NYSTATIN 100000 UNIT/GM EX POWD: Freq: Four times a day (QID) | CUTANEOUS | 1 refills | Status: DC

## 2017-09-06 MED ORDER — CLOTRIMAZOLE-BETAMETHASONE 1-0.05 % EX CREA: 1.0000 "application " | TOPICAL_CREAM | Freq: Two times a day (BID) | CUTANEOUS | 1 refills | Status: DC

## 2017-09-06 NOTE — Progress Notes (Signed)
Patient ID: Tonya PhenixLaurie W Kallam, female   DOB: 01/24/1976, 42 y.o.   MRN: [id number] Fax copy of lab results to Tamela OddiJo Hughes at [facility] F: [phone]

## 2017-09-06 NOTE — Patient Instructions (Addendum)
     IF you received an x-ray today, you will receive an invoice from [facility]. Please contact [facility] at [phone] with questions or concerns regarding your invoice.   IF you received labwork today, you will receive an invoice from [facility]. Please contact [facility] at [phone] with questions or concerns regarding your invoice.   Our billing staff will not be able to assist you with questions regarding bills from these companies.  You will be contacted with the lab results as soon as they are available. The fastest way to get your results is to activate your My Chart account. Instructions are located on the last page of this paperwork. If you have not heard from us regarding the results in 2 weeks, please contact this office.     Skin Yeast Infection Skin yeast infection is a condition in which there is an overgrowth of yeast (candida) that normally lives on the skin. This condition usually occurs in areas of the skin that are constantly warm and moist, such as the armpits or the groin. What are the causes? This condition is caused by a change in the normal balance of the yeast and bacteria that live on the skin. What increases the risk? This condition is more likely to develop in:  People who are obese.  Pregnant women.  Women who take birth control pills.  People who have diabetes.  People who take antibiotic medicines.  People who take steroid medicines.  People who are malnourished.  People who have a weak defense (immune) system.  People who are 65 years of age or older.  What are the signs or symptoms? Symptoms of this condition include:  A red, swollen area of the skin.  Bumps on the skin.  Itchiness.  How is this diagnosed? This condition is diagnosed with a medical history and physical exam. Your health care provider may check for yeast by taking light scrapings of the skin to be viewed under a microscope. How is this  treated? This condition is treated with medicine. Medicines may be prescribed or be available over-the-counter. The medicines may be:  Taken by mouth (orally).  Applied as a cream.  Follow these instructions at home:  Take or apply over-the-counter and prescription medicines only as told by your health care provider.  Eat more yogurt. This may help to keep your yeast infection from returning.  Maintain a healthy weight. If you need help losing weight, talk with your health care provider.  Keep your skin clean and dry.  If you have diabetes, keep your blood sugar under control. Contact a health care provider if:  Your symptoms go away and then return.  Your symptoms do not get better with treatment.  Your symptoms get worse.  Your rash spreads.  You have a fever or chills.  You have new symptoms.  You have new warmth or redness of your skin. This information is not intended to replace advice given to you by your health care provider. Make sure you discuss any questions you have with your health care provider. Document Released: 12/09/2010 Document Revised: 11/17/2015 Document Reviewed: 09/24/2014 Elsevier Interactive Patient Education  2018 Elsevier Inc.  

## 2017-09-06 NOTE — Progress Notes (Signed)
Subjective:  By signing my name below, I, Tonya Perry, attest that this documentation has been prepared under the direction and in the presence of Tonya Sorenson, MD Electronically Signed: [name], ED Scribe 09/06/2017 at 5:08 PM.   Patient ID: Tonya Perry, female    DOB: 1976-01-26, 42 y.o.   MRN: [id number]  Chief Complaint  Patient presents with  . breast    red,blotchy,and itchy under both x 1 wk, (depression score during triage 15)  . vaginal discharge    green discharge x 2 wks   HPI Tonya Perry is a 42 y.o. female who presents to Primary Care at [facility]. Last pap was 1.5 yr ago. In 2015, pt had Mirena IUD, HPV testing done. She presents today with green vaginal discharge x 2 wks. No treatments tried PTA.  Breast Pt reports redness and itching under both breast x 1 wk. She has applied triamcinolone cream without improvement.  Depression Depression screen [facility] 2/9 09/06/2017 07/01/2017 04/27/2017 02/03/2017 01/15/2017  Decreased Interest 2 3 3 3 3   Down, Depressed, Hopeless 2 2 3 3 3   PHQ - 2 Score 4 5 6 6 6   Altered sleeping 0 3 3 3 3   Tired, decreased energy 1 3 3 1 3   Change in appetite 2 3 1 3 3   Feeling bad or failure about yourself  3 3 1 1 3   Trouble concentrating 3 3 3 3 3   Moving slowly or fidgety/restless 1 2 3  0 3  Suicidal thoughts 1 2 0 0 0  PHQ-9 Score 15 24 20 17 24   Difficult doing work/chores Very difficult Very difficult - - -  Some recent data might be hidden  Follows with a psychiatrist and therapist.  Past Medical History:  Diagnosis Date  . Anxiety   . Arthritis   . Asthma   . Depression   . Fibromyalgia   . Iron deficiency anemia   . Kienbck's disease    LEFT WRIST  . Left wrist pain    retained deep implant   Current Outpatient Medications on File Prior to Visit  Medication Sig Dispense Refill  . acetaminophen (TYLENOL) 500 MG tablet Take 500 mg by mouth.    Marland Kitchen albuterol (PROAIR HFA) 108 (90 Base) MCG/ACT inhaler Inhale 2  puffs into the lungs as needed for wheezing (2 puffs as needed every 4-6h as needed for cough/wheeze). 1 Inhaler 5  . cetirizine (ZYRTEC) 10 MG tablet Take 1 tablet (10 mg total) by mouth at bedtime. (Patient taking differently: Take 10 mg by mouth every morning. ) 90 tablet 3  . EPIPEN 2-PAK 0.3 MG/0.3ML SOAJ injection Inject 0.3 mg as directed as needed (systemic reaction to seafood allergy).   1  . fluticasone (FLONASE) 50 MCG/ACT nasal spray Place 2 sprays into both nostrils daily. (Patient taking differently: Place 2 sprays into both nostrils daily as needed. ) 16 g 11  . gabapentin (NEURONTIN) 600 MG tablet Take 1 tab po qam and 1 tab po qafternoon and 2 tabs po qhs 360 tablet 1  . ibuprofen (ADVIL,MOTRIN) 200 MG tablet Take 200 mg by mouth every 6 (six) hours as needed.    . lamoTRIgine (LAMICTAL) 25 MG tablet Take 25 mg by mouth 3 (three) times daily.     . Multiple Vitamins-Minerals (MULTIVITAMIN WITH MINERALS) tablet Take 1 tablet by mouth daily.    . sertraline (ZOLOFT) 100 MG tablet sertraline 100 mg tablet    . VYVANSE 30 MG capsule daily.     Marland Kitchen  chlorhexidine (PERIDEX) 0.12 % solution Use as directed 10 mLs in the mouth or throat 2 (two) times daily. (Patient not taking: Reported on 09/06/2017) 500 mL 0  . fish oil-omega-3 fatty acids 1000 MG capsule Take 1 g by mouth daily.     No current facility-administered medications on file prior to visit.     Past Surgical History:  Procedure Laterality Date  . LAPAROSCOPIC APPENDECTOMY N/A 11/04/2013   Procedure: APPENDECTOMY LAPAROSCOPIC;  Surgeon: Valarie MerinoMatthew B Martin, MD;  Location: [facility];  Service: General;  Laterality: N/A;  . REMOVAL OF IMPLANT Left 06/13/2015   Procedure: LEFT WRIST DEEP IMPLANT REMOVAL ;  Surgeon: Bradly BienenstockFred Ortmann, MD;  [facility] [place];  Service: Orthopedics;  Laterality: Left;  . WRIST RECONSTRUCTION Left 04-15-2015    for Kienbock's disease     Allergies  Allergen Reactions  . Azithromycin Diarrhea    . Hydrocodone-Acetaminophen Itching  . Latex Itching  . Other Itching and Other (See Comments)    Peas, Pineapple, Mango, Melons, Cantelope, Watermelons.  Dust mites, trees, grass and pollen.  Causes throat to swell with itching. Tree nuts, causes throat to swell with itching.  . Shellfish Allergy Itching and Swelling  . Voltaren [Diclofenac Sodium] Other (See Comments)    Upset stomach; made pt feel like had to go to bathroom  . Penicillins Rash   Family History  Problem Relation Age of Onset  . Depression Mother   . Diabetes Mother   . Mental illness Mother   . COPD Father   . Mental illness Father   . Hypertension Father   . Hyperlipidemia Father   . Depression Maternal Grandmother   . Mental illness Maternal Grandmother   . Diabetes Maternal Grandfather   . Mental illness Maternal Grandfather   . Cancer Paternal Grandfather   . Mental illness Paternal Grandfather   . Mental illness Sister   . Mental illness Sister    Social History   Socioeconomic History  . Marital status: Married    Spouse name: Tonya Perry  . Number of children: 1  . Years of education: Assoc  . Highest education level: Not on file  Occupational History    Comment: not employed  Social Needs  . Financial resource strain: Not on file  . Food insecurity:    Worry: Not on file    Inability: Not on file  . Transportation needs:    Medical: Not on file    Non-medical: Not on file  Tobacco Use  . Smoking status: Former Smoker    Years: 5.00    Last attempt to quit: 04/06/1996    Years since quitting: 21.4  . Smokeless tobacco: Never Used  Substance and Sexual Activity  . Alcohol use: Yes    Alcohol/week: 0.0 oz    Comment: occas.  . Drug use: No  . Sexual activity: Not on file    Comment: 2015-- Placement Mirena IUD  Lifestyle  . Physical activity:    Days per week: Not on file    Minutes per session: Not on file  . Stress: Not on file  Relationships  . Social connections:    Talks on  phone: Not on file    Gets together: Not on file    Attends religious service: Not on file    Active member of club or organization: Not on file    Attends meetings of clubs or organizations: Not on file    Relationship status: Not on file  Other Topics  Concern  . Not on file  Social History Narrative   Patient consumes 4-5 cups of caffeine daily.   Depression screen [facility] 2/9 09/06/2017 07/01/2017 04/27/2017 02/03/2017 01/15/2017  Decreased Interest 2 3 3 3 3   Down, Depressed, Hopeless 2 2 3 3 3   PHQ - 2 Score 4 5 6 6 6   Altered sleeping 0 3 3 3 3   Tired, decreased energy 1 3 3 1 3   Change in appetite 2 3 1 3 3   Feeling bad or failure about yourself  3 3 1 1 3   Trouble concentrating 3 3 3 3 3   Moving slowly or fidgety/restless 1 2 3  0 3  Suicidal thoughts 1 2 0 0 0  PHQ-9 Score 15 24 20 17 24   Difficult doing work/chores Very difficult Very difficult - - -  Some recent data might be hidden     Review of Systems  Genitourinary: Positive for vaginal discharge.  Skin: Positive for color change.  Psychiatric/Behavioral: Positive for dysphoric mood.      Objective:   Physical Exam  Constitutional: She is oriented to person, place, and time. She appears well-developed and well-nourished. No distress.  HENT:  Head: Normocephalic and atraumatic.  Eyes: Conjunctivae and EOM are normal.  Neck: Neck supple. No tracheal deviation present.  Cardiovascular: Normal rate.  Pulmonary/Chest: Effort normal. No respiratory distress.  Breast: Erythematous plaques between breast and under with satellite lesions surrounding. No ulceration, odor, or fluctuance. vVery well-defined. Did not fluoresce under wood's lamp.  Musculoskeletal: Normal range of motion.  Neurological: She is alert and oriented to person, place, and time.  Skin: Skin is warm and dry.  Psychiatric: She has a normal mood and affect. Her behavior is normal.  Nursing note and vitals reviewed. BP 114/74 (BP Location: Left Arm,  Patient Position: Sitting, Cuff Size: Large)   Pulse 95   Temp 99 F (37.2 C) (Oral)   Resp 16   Ht 5\' 3"  (1.6 m)   Wt 219 lb (99.3 kg)   SpO2 95%   BMI 38.79 kg/m     Results for orders placed or performed in visit on 09/06/17  POCT urinalysis dipstick  Result Value Ref Range   Color, UA  yellow   Clarity, UA clear clear   Glucose, UA negative negative mg/dL   Bilirubin, UA negative negative   Ketones, POC UA negative negative mg/dL   Spec Grav, UA 4.098 1.191 - 1.025   Blood, UA small (A) negative   pH, UA 6.5 5.0 - 8.0   Protein Ur, POC negative negative mg/dL   Urobilinogen, UA 0.2 0.2 or 1.0 E.U./dL   Nitrite, UA Negative Negative   Leukocytes, UA Negative Negative  POCT Microscopic Urinalysis ([facility])  Result Value Ref Range   WBC,UR,HPF,POC Few (A) None WBC/hpf   RBC,UR,HPF,POC None None RBC/hpf   Bacteria None None, Too numerous to count   Mucus Absent Absent   Epithelial Cells, UR Per Microscopy Few (A) None, Too numerous to count cells/hpf  POCT Wet + KOH Prep  Result Value Ref Range   Yeast by KOH Absent Absent   Yeast by wet prep Absent Absent   WBC by wet prep Few Few   Clue Cells Wet Prep HPF POC Few (A) None   Trich by wet prep Absent Absent   Bacteria Wet Prep HPF POC Moderate (A) Few   Epithelial Cells By [employer] Pref ([facility]) Many (A) None, Few, Too numerous to count   RBC,UR,HPF,POC None None RBC/hpf  Assessment & Plan:   1. Routine screening for STI (sexually transmitted infection)   2. Vaginal discharge   3. Intertriginous dermatitis associated with moisture   4. Medication monitoring encounter   5. Long-term use of high-risk medication   6. Intertriginous candidiasis     Orders Placed This Encounter  Procedures  . GC/Chlamydia Probe Amp  . Trichomonas vaginalis, RNA  . CBC with Differential/Platelet    Fax copy of results to Tamela Oddi at [facility] F: [phone]    Standing Status:   Future    Standing  Expiration Date:   09/07/2018  . Comprehensive metabolic panel    Fax copy of results to Tamela Oddi at [facility] F: [phone]    Standing Status:   Future    Standing Expiration Date:   09/07/2018    Order Specific Question:   Has the patient fasted?    Answer:   Yes  . Lipid panel    Fax copy of results to Tamela Oddi at [facility] F: [phone]    Standing Status:   Future    Standing Expiration Date:   09/07/2018    Order Specific Question:   Has the patient fasted?    Answer:   Yes  . POCT urinalysis dipstick  . POCT Microscopic Urinalysis ([facility])  . POCT Wet + KOH Prep    Meds ordered this encounter  Medications  . clotrimazole-betamethasone (LOTRISONE) cream    Sig: Apply 1 application topically 2 (two) times daily.    Dispense:  45 g    Refill:  1  . nystatin (MYCOSTATIN/NYSTOP) powder    Sig: Apply topically 4 (four) times daily.    Dispense:  60 g    Refill:  1    I personally performed the services described in this documentation, which was scribed in my presence. The recorded information has been reviewed and considered, and addended by me as needed.   Tonya Perry, M.D.  Primary Care at [facility] [address] [place], [place] [address] [phone] phone [phone] fax  09/09/17 8:51 AM

## 2017-09-08 LAB — GC/CHLAMYDIA PROBE AMP
Chlamydia trachomatis, NAA: POSITIVE — AB
NEISSERIA GONORRHOEAE BY PCR: NEGATIVE

## 2017-09-09 LAB — TRICHOMONAS VAGINALIS, PROBE AMP: Trich vag by NAA: POSITIVE — AB

## 2017-09-09 MED ORDER — METRONIDAZOLE 500 MG PO TABS: 2000.0000 mg | ORAL_TABLET | Freq: Once | ORAL | 0 refills | Status: AC

## 2017-09-09 MED ORDER — AZITHROMYCIN 500 MG PO TABS: 1000.0000 mg | ORAL_TABLET | Freq: Once | ORAL | 0 refills | Status: AC

## 2017-09-20 ENCOUNTER — Telehealth: Payer: Self-pay | Admitting: Family Medicine

## 2017-09-20 NOTE — Telephone Encounter (Signed)
Can we release labs.  Thank you.

## 2017-09-20 NOTE — Telephone Encounter (Signed)
Copied from CRM [phone]. Topic: Quick Communication - Office Called Patient >> Sep 09, 2017  8:58 AM Oneal GroutSebastian, Jennifer S wrote: Reason for CRM: returning call  >> Sep 20, 2017  9:13 AM Arlyss Gandyichardson, Casandra Dallaire N, NT wrote: Pt would like a call to discuss her lab results.

## 2017-09-20 NOTE — Telephone Encounter (Signed)
I had sent pt meds to trx this on 6/6 to her pharmacy and LVM on that day vaguely worded about need for new meds at pharm. I will call pt to discuss further 6/18.

## 2017-09-21 ENCOUNTER — Encounter: Payer: Self-pay | Admitting: Family Medicine

## 2017-09-21 MED ORDER — METRONIDAZOLE 500 MG PO TABS: 2000.0000 mg | ORAL_TABLET | Freq: Once | ORAL | 1 refills | Status: AC

## 2017-09-21 MED ORDER — AZITHROMYCIN 500 MG PO TABS: 1000.0000 mg | ORAL_TABLET | Freq: Once | ORAL | 0 refills | Status: AC

## 2017-09-21 NOTE — Telephone Encounter (Signed)
Called pt to discuss. She was negative for chlamydia 1/22 so has been infected at some point since that time.    She was negative for trichomonas in office wet preps 1/22 and 6/3 but then trichomonas vaginalis RNA probe + for 6/3 so can't say when infxn occurred as clearly our test is having some false negative but likely since she was treated for BV with metrogel in Oct 2018  She did take the azithro I sent in on 6/6 but not the metronidazole as she didn't know what it was for. However, has not informed current partner and has had intercourse since that time. Still has sxs of vag d/c - feels like BV - pt reassured is likely trich and should resolve w/ metronidazole 2g po x 1 today.  She is currently in a monogamous relationship since April and very worried about the ramifications of this. Reassured pt that either partner could have been infected prior to that time and unwittingly gave it to the other but current partner has to be informed and treated for both trich and chlamydia and then she has to be retreated for both as soon as he is also treated and before the resume intercourse to avoid passing bacteria back and forth - sent in second rounds of azithro 1g and metronidazole 2g for her to take as soon as he is also treated.  Warned that needs to be [facility] to [facility] as men often don't have sxs, can unwittingly infect women and who can then develop severe illness w/ PID, chronic pelvic pain, and infertility if left untreated in women.

## 2017-09-28 ENCOUNTER — Telehealth: Payer: Self-pay | Admitting: Family Medicine

## 2017-09-28 NOTE — Telephone Encounter (Signed)
Copied from CRM [id number]. Topic: Quick Communication - Rx Refill/Question >> Sep 28, 2017  6:24 PM Alexander BergeronBarksdale, [name] wrote: Medication: azithromycin (z-pack)  Pt is allergic to the medication and she is constantly having to go to the bathroom; call pt to advise

## 2017-09-29 NOTE — Telephone Encounter (Signed)
Please advise 

## 2017-09-29 NOTE — Telephone Encounter (Signed)
Patient calling in regards to the reaction she was having to azithromycin. States that she would like to talk to Dr Clelia CroftShaw or her assistant. Please advise. CB#: [phone]

## 2017-09-30 NOTE — Telephone Encounter (Signed)
Patient has called for third time requesting another antibiotic because the current med. Is making her sick and causing loose bowels.  Please advise.

## 2017-09-30 NOTE — Telephone Encounter (Signed)
The azithromycin was only 2 tabs once - it was a one time dose - so if it is causing her loose bowels - that means she has already taken the single dose so she wouldn't need any other antibiotics.   She took the azithro 2 tabs once initially on 6/6 and when I spoke to her 9 days later on 6/17 she stated she tolerated the med just fine - no problems then.  So if she is having diarrhea, it wasn't from the first one time treatment and if it is form the second one time dose than it is over and done with so another antibiotic would only cause much worse symptoms.  Recommend she come in for an acute/same day appointment for "test of cure" repeat pelvic and can see if she needs to be tested for c. Diff stool at that point if diarrhea is still continuing.

## 2017-10-01 ENCOUNTER — Other Ambulatory Visit: Payer: Self-pay

## 2017-10-01 ENCOUNTER — Encounter: Payer: Self-pay | Admitting: Family Medicine

## 2017-10-01 ENCOUNTER — Ambulatory Visit: Payer: BLUE CROSS/BLUE SHIELD | Admitting: Family Medicine

## 2017-10-01 VITALS — BP 122/72 | HR 80 | Temp 98.5°F | Resp 16 | Ht 63.19 in | Wt 214.0 lb

## 2017-10-01 DIAGNOSIS — R197 Diarrhea, unspecified: Secondary | ICD-10-CM | POA: Diagnosis not present

## 2017-10-01 DIAGNOSIS — R05 Cough: Secondary | ICD-10-CM | POA: Diagnosis not present

## 2017-10-01 DIAGNOSIS — R059 Cough, unspecified: Secondary | ICD-10-CM

## 2017-10-01 DIAGNOSIS — Z202 Contact with and (suspected) exposure to infections with a predominantly sexual mode of transmission: Secondary | ICD-10-CM | POA: Diagnosis not present

## 2017-10-01 DIAGNOSIS — Z8619 Personal history of other infectious and parasitic diseases: Secondary | ICD-10-CM

## 2017-10-01 NOTE — Patient Instructions (Addendum)
We are repeating the testing for gonorrhea and chlamydia since you probably have been exposed since you were treated.  Require that your boyfriend be checked before you resume having any sexual intercourse.  Both need to be confirmed to be clear of disease.  The Clostridium difficile test will be done to assess the loose stools.  However I suspect there is a good chance that they are a side effect of your medications.  Try decreasing the sertraline dose to one half dose for a few days or a week or so and see if symptoms improve.  For your hip and joint pains take acetaminophen (Tylenol) 500 mg 2 pills twice daily on a regular basis for a week or so and see if that will calm it on down.  I imagine the cough is seasonal allergic related, and if it does not subside may need further attention.   Return to see Dr. Clelia CroftShaw as necessary.      IF you received an x-ray today, you will receive an invoice from [facility]. Please contact [facility] at [phone] with questions or concerns regarding your invoice.   IF you received labwork today, you will receive an invoice from [facility]. Please contact [facility] at [phone] with questions or concerns regarding your invoice.   Our billing staff will not be able to assist you with questions regarding bills from these companies.  You will be contacted with the lab results as soon as they are available. The fastest way to get your results is to activate your My Chart account. Instructions are located on the last page of this paperwork. If you have not heard from us regarding the results in 2 weeks, please contact this office.

## 2017-10-01 NOTE — Progress Notes (Signed)
Patient ID: Tonya Perry, female    DOB: 1975-04-16  Age: 42 y.o. MRN: [id number]  Chief Complaint  Patient presents with  . Diarrhea    pt states she is still having some diarrhea after taking the antibotic and Dr. Clelia CroftShaw wanted her to come back in the office to check for possible C-diff   . Rash    pt states she has a rash under her breast     Subjective:   Patient is here with several complaints.  Dr. Clelia CroftShaw had talked to her and asked her to come in and let me check of this morning.  To make sure she did not have Clostridium.  She has been having diarrhea for several months.  Actually she has had it still since the antibiotic but did have it preceding the antibiotics.  Whenever she goes to the bathroom she seems to have loose stool.  She keeps having a irritation of the skin underneath her breasts.  Especially if she wears a wire rimmed bra.  This burns and has discomfort.  She still has some vaginal symptoms that she feels like her similar to the chlamydia and trichomonas that she had.  She has had unprotected intercourse since treatment.  Her boyfriend has not yet gone to get himself treated because he does not have any insurance.  She has been with him for several months.  She complains of a persistent cough, sometimes bothers her in the nighttime.  No upper respiratory symptoms.  The symptoms have been primarily since she was treated.  Also she complains of hip and knee pain.  Current allergies, medications, problem list, past/family and social histories reviewed.  Objective:  BP 122/72   Pulse 80   Temp 98.5 F (36.9 C) (Oral)   Resp 16   Ht 5' 3.19" (1.605 m)   Wt 214 lb (97.1 kg)   SpO2 96%   BMI 37.68 kg/m   No major acute distress.  No major rash under her breasts, mild discoloration of the skin and mild erythema, looks more just like a little irritation.  Her chest is clear.  Pelvic not done.  Assessment & Plan:   Assessment: 1. Exposure to STD   2. Diarrhea,  unspecified type   3. History of trichomoniasis   4. History of chlamydia   5. Cough       Plan: We will check the labs and wait for results of them before giving other treatments.  Orders Placed This Encounter  Procedures  . Clostridium Difficile by PCR    Order Specific Question:   Is your patient experiencing loose or watery stools (3 or more in 24 hours)?    Answer:   Yes    Order Specific Question:   Has the patient received laxatives in the last 24 hours?    Answer:   No    Order Specific Question:   Has a negative Cdiff test resulted in the last 7 days?    Answer:   No  . GC/Chlamydia Probe Amp  . Trichomonas vaginalis, RNA  . HIV antibody  . RPR    No orders of the defined types were placed in this encounter.        Patient Instructions   We are repeating the testing for gonorrhea and chlamydia since you probably have been exposed since you were treated.  Require that your boyfriend be checked before you resume having any sexual intercourse.  Both need to be confirmed to be clear  of disease.  The Clostridium difficile test will be done to assess the loose stools.  However I suspect there is a good chance that they are a side effect of your medications.  Try decreasing the sertraline dose to one half dose for a few days or a week or so and see if symptoms improve.  For your hip and joint pains take acetaminophen (Tylenol) 500 mg 2 pills twice daily on a regular basis for a week or so and see if that will calm it on down.  I imagine the cough is seasonal allergic related, and if it does not subside may need further attention.   Return to see Dr. Clelia Croft as necessary.      IF you received an x-ray today, you will receive an invoice from [facility]. Please contact [facility] at [phone] with questions or concerns regarding your invoice.   IF you received labwork today, you will receive an invoice from [facility]. Please contact [facility] at  [phone] with questions or concerns regarding your invoice.   Our billing staff will not be able to assist you with questions regarding bills from these companies.  You will be contacted with the lab results as soon as they are available. The fastest way to get your results is to activate your My Chart account. Instructions are located on the last page of this paperwork. If you have not heard from [place] regarding the results in 2 weeks, please contact this office.         No follow-ups on file.   Janace Hoard, MD 10/01/2017

## 2017-10-01 NOTE — Telephone Encounter (Signed)
Spoke to pt.  She is still having diarrhea.  Appt made for today with Dr. Alwyn RenHopper to test for c-diff per Dr. Alver FisherShaw's message

## 2017-10-02 LAB — GC/CHLAMYDIA PROBE AMP
CHLAMYDIA, DNA PROBE: NEGATIVE
Neisseria gonorrhoeae by PCR: NEGATIVE

## 2017-10-02 LAB — HIV ANTIBODY (ROUTINE TESTING W REFLEX): HIV Screen 4th Generation wRfx: NONREACTIVE

## 2017-10-02 LAB — RPR: RPR: NONREACTIVE

## 2017-10-02 LAB — TRICHOMONAS VAGINALIS, PROBE AMP: TRICH VAG BY NAA: NEGATIVE

## 2017-11-03 ENCOUNTER — Telehealth: Payer: Self-pay | Admitting: Family Medicine

## 2017-11-03 NOTE — Telephone Encounter (Signed)
pls advise

## 2017-11-03 NOTE — Telephone Encounter (Signed)
Copied from CRM [phone]. Topic: Inquiry >> Nov 03, 2017 12:42 PM Burchel, Abbi R wrote: Reason for CRM:   Pt requesting  a note for her employer stating her dx: fibromyalgia  Pt: [phone]

## 2017-11-08 NOTE — Telephone Encounter (Signed)
Pt calling to f.up  Please add all diagnoses on the letter fir her employer.

## 2017-11-09 DIAGNOSIS — M931 Kienbock's disease of adults: Secondary | ICD-10-CM | POA: Insufficient documentation

## 2017-11-13 ENCOUNTER — Telehealth: Payer: Self-pay | Admitting: Family Medicine

## 2017-11-13 NOTE — Telephone Encounter (Signed)
Pt called and states that she is having a flare up of her chlamidia.  She states that the azythromicine that Clelia CroftShaw prescribed caused her horrible diarrhea and would like something else to be called in.  She is in a lot of discomfort.  She is also checking in on the status of a work note discussing her fibromyalgia and other conditions.  Please advise  Preferred pharmacy: [facility] [address]

## 2017-11-15 ENCOUNTER — Ambulatory Visit: Payer: BLUE CROSS/BLUE SHIELD | Admitting: Family Medicine

## 2017-11-16 DIAGNOSIS — F909 Attention-deficit hyperactivity disorder, unspecified type: Secondary | ICD-10-CM | POA: Insufficient documentation

## 2017-11-16 DIAGNOSIS — F3161 Bipolar disorder, current episode mixed, mild: Secondary | ICD-10-CM | POA: Insufficient documentation

## 2017-11-18 NOTE — Telephone Encounter (Signed)
Dr. Clelia CroftShaw   Patient had an appointment at her OBGYN they retested her.  But she states she still needs the notes.

## 2018-03-05 ENCOUNTER — Ambulatory Visit (INDEPENDENT_AMBULATORY_CARE_PROVIDER_SITE_OTHER): Payer: BLUE CROSS/BLUE SHIELD

## 2018-03-05 ENCOUNTER — Encounter: Payer: Self-pay | Admitting: Physician Assistant

## 2018-03-05 ENCOUNTER — Other Ambulatory Visit: Payer: Self-pay

## 2018-03-05 ENCOUNTER — Ambulatory Visit (INDEPENDENT_AMBULATORY_CARE_PROVIDER_SITE_OTHER): Payer: BLUE CROSS/BLUE SHIELD | Admitting: Physician Assistant

## 2018-03-05 VITALS — BP 132/85 | HR 87 | Temp 98.2°F | Resp 16 | Ht 63.5 in | Wt 215.0 lb

## 2018-03-05 DIAGNOSIS — M545 Low back pain, unspecified: Secondary | ICD-10-CM

## 2018-03-05 DIAGNOSIS — M25552 Pain in left hip: Secondary | ICD-10-CM

## 2018-03-05 MED ORDER — CYCLOBENZAPRINE HCL ER 15 MG PO CP24: 15.0000 mg | ORAL_CAPSULE | Freq: Every day | ORAL | 1 refills | Status: DC | PRN

## 2018-03-05 MED ORDER — CYCLOBENZAPRINE HCL 10 MG PO TABS: 10.0000 mg | ORAL_TABLET | Freq: Three times a day (TID) | ORAL | 1 refills | Status: DC | PRN

## 2018-03-05 NOTE — Patient Instructions (Addendum)
  You will receive a phone call (in 1-2 weeks) to schedule an appointment with physical therapy.   Amrix is a muscle relaxer. This may make you drowsy.   Apply moist heat to the area. Wet a towel and wring it out so it is damp. Put it in the microwave for about 15-20 seconds - long enough to make it hot, but not too hot to apply to your skin causing burns. Do this for about 20-30 minutes, 3-4 times a day.   Perform gentle, light stretches 2-3 times a day.  google gluteus and low back stretches on youtube.   Put a tennis ball between your back and a wall. Gentle massage the area with rolling the ball around the affected area. Try a foam roller.   Stay well hydrated - try to drink 32-64 oz/day.    Thank you for coming in today. I hope you feel we met your needs.  Feel free to call PCP if you have any questions or further requests.  Please consider signing up for MyChart if you do not already have it, as this is a great way to communicate with me.  Best,  [employer], PA-C

## 2018-03-05 NOTE — Progress Notes (Signed)
Tonya Perry  MRN: [id number] DOB: 1976/03/08  PCP: Sherren MochaShaw, Eva N, MD  Subjective:  Pt is a 42 year old female who presents to clinic for left hip pain x 2 months.  Hurts with walking and standing from seated position. "now it hurts no matter what I do".  Pain is about 7/10.  Pain is sharp.  Pain is worsening over the past month. Radiates down outside of left thigh to knee. associated low left back pain.   She has been taking Tylenol and ibuprofen - not helping.  Denies n/t, muscle weakness, loss of bowel or bladder function.   Her mother has h/o degenerative disc disease (per pt)  Review of Systems  Gastrointestinal: Negative for abdominal pain, nausea and vomiting.  Genitourinary: Negative for dysuria, flank pain, frequency, hematuria, pelvic pain and urgency.  Musculoskeletal: Positive for arthralgias (left hip) and back pain. Negative for gait problem and joint swelling.  Skin: Negative.     Patient Active Problem List   Diagnosis Date Noted  . 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 Degeneration, intervertebral disc, lumbosacral 12/28/2014  . 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 Fibromyalgia 10/27/2014  . Asthma, mild intermittent, uncomplicated 08/22/2014  . Chronic allergic conjunctivitis 08/22/2014  . 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 Rash and nonspecific skin eruption 08/22/2014  . Depression 07/18/2014  . Anxiety 07/18/2014  . Counseling on substance use and abuse 07/18/2014  . Arthritis 07/18/2014  . Allergic rhinitis 01/22/2014    Current Outpatient Medications on File Prior to Visit  Medication Sig Dispense Refill  . acetaminophen (TYLENOL) 500 MG tablet Take 500 mg by mouth.    Marland Kitchen. albuterol (PROAIR HFA) 108 (90 Base) MCG/ACT inhaler Inhale 2 puffs into the lungs as needed for wheezing (2 puffs as needed every 4-6h as needed for cough/wheeze). 1 Inhaler 5  . cetirizine (ZYRTEC) 10 MG tablet Take 1 tablet (10 mg total) by mouth at bedtime. (Patient taking differently:  Take 10 mg by mouth every morning. ) 90 tablet 3  . chlorhexidine (PERIDEX) 0.12 % solution Use as directed 10 mLs in the mouth or throat 2 (two) times daily. 500 mL 0  . clotrimazole-betamethasone (LOTRISONE) cream Apply 1 application topically 2 (two) times daily. 45 g 1  . EPIPEN 2-PAK 0.3 MG/0.3ML SOAJ injection Inject 0.3 mg as directed as needed (systemic reaction to seafood allergy).   1  . fish oil-omega-3 fatty acids 1000 MG capsule Take 1 g by mouth daily.    . fluticasone (FLONASE) 50 MCG/ACT nasal spray Place 2 sprays into both nostrils daily. (Patient taking differently: Place 2 sprays into both nostrils daily as needed. ) 16 g 11  . gabapentin (NEURONTIN) 600 MG tablet Take 1 tab po qam and 1 tab po qafternoon and 2 tabs po qhs 360 tablet 1  . ibuprofen (ADVIL,MOTRIN) 200 MG tablet Take 200 mg by mouth every 6 (six) hours as needed.    . lamoTRIgine (LAMICTAL) 25 MG tablet Take 25 mg by mouth 3 (three) times daily.     . Multiple Vitamins-Minerals (MULTIVITAMIN WITH MINERALS) tablet Take 1 tablet by mouth daily.    . sertraline (ZOLOFT) 100 MG tablet sertraline 100 mg tablet    . traZODone (DESYREL) 50 MG tablet Take 50 mg by mouth at bedtime.    Marland Kitchen. VYVANSE 30 MG capsule daily.     Marland Kitchen. nystatin (MYCOSTATIN/NYSTOP) powder Apply topically 4 (four) times daily. (Patient not taking: Reported on 03/05/2018) 60 g 1   No current facility-administered medications  on file prior to visit.     Allergies  Allergen Reactions  . Azithromycin Diarrhea  . Hydrocodone-Acetaminophen Itching  . Latex Itching  . Other Itching and Other (See Comments)    Peas, Pineapple, Mango, Melons, Cantelope, Watermelons.  Dust mites, trees, grass and pollen.  Causes throat to swell with itching. Tree nuts, causes throat to swell with itching.  . Shellfish Allergy Itching and Swelling  . Voltaren [Diclofenac Sodium] Other (See Comments)    Upset stomach; made pt feel like had to go to bathroom  . Penicillins  Rash     Objective:  BP 132/85 (BP Location: Right Arm, Patient Position: Sitting, Cuff Size: Large)   Pulse 87   Temp 98.2 F (36.8 C) (Oral)   Resp 16   Ht 5' 3.5" (1.613 m)   Wt 215 lb (97.5 kg)   SpO2 95%   BMI 37.49 kg/m   Physical Exam  Musculoskeletal:       Right hip: She exhibits normal range of motion, normal strength, no tenderness and no bony tenderness.       Left hip: She exhibits tenderness and bony tenderness. She exhibits normal range of motion, normal strength, no swelling and no crepitus.       Lumbar back: She exhibits tenderness and bony tenderness. She exhibits normal range of motion.       Back:  Neurological: She is alert. She has normal strength. No sensory deficit.   Dg Lumbar Spine Complete  Result Date: 03/05/2018 CLINICAL DATA:  Back and left hip pain EXAM: LUMBAR SPINE - COMPLETE 4+ VIEW COMPARISON:  11/04/2013 CT reconstructions FINDINGS: Normal alignment. Minor endplate bony sclerosis and spurring. Preserved vertebral body heights and disc spaces. No significant degenerative process or spondylosis. No pars defects. Minor SI joint arthropathy. Normal bowel gas pattern. IUD noted within the midline of the pelvis. IMPRESSION: No significant or acute finding by plain radiography Electronically Signed   By: Judie Petit.  Shick M.D.   On: 03/05/2018 10:29   Dg Hip Unilat W Or W/o Pelvis 2-3 Views Left  Result Date: 03/05/2018 CLINICAL DATA:  Worsening left hip pain for 2 months. EXAM: DG HIP (WITH OR WITHOUT PELVIS) 2-3V LEFT COMPARISON:  None. FINDINGS: There is no evidence of hip fracture or dislocation. There is no evidence of arthropathy or other focal bone abnormality. IUD noted within the pelvis. IMPRESSION: Negative. Electronically Signed   By: Signa Kell M.D.   On: 03/05/2018 10:29   Assessment and Plan :   1. Left hip pain - Pt endorses worsening left hip and left low back pain x 2 months. No red flags. X-rays are negative. Plan to refer to PT for  eval and therapy. RTC if symptoms worsen.  - DG HIP UNILAT W OR W/O PELVIS 2-3 VIEWS LEFT; Future - DG Lumbar Spine Complete; Future - Ambulatory referral to Physical Therapy  2. Left-sided low back pain without sciatica, unspecified chronicity - Ambulatory referral to Physical Therapy - cyclobenzaprine (AMRIX) 15 MG 24 hr capsule; Take 1-2 capsules (15-30 mg total) by mouth daily as needed for muscle spasms.  Dispense: 30 capsule; Refill: 1   Whitney Deago Burruss, PA-C  [facility] 03/05/2018 9:41 AM  Please note: Portions of this report may have been transcribed using dragon voice recognition software. Every effort was made to ensure accuracy; however, inadvertent computerized transcription errors may be present.

## 2018-03-08 ENCOUNTER — Ambulatory Visit: Payer: Self-pay

## 2018-03-08 NOTE — Telephone Encounter (Deleted)
  Reason for Disposition . Caller has NON-URGENT medication question about med that PCP prescribed and triager unable to answer question  Protocols used: MEDICATION QUESTION CALL-A-AH  

## 2018-03-08 NOTE — Telephone Encounter (Signed)
Patient called and she says the Amrix prescribed for back spasms is not working. She is taking 2 tablets daily as prescribed and is still having pain down into her leg. She says she would like something else called in to use instead. I advised [facility] and someone from the office will call with her recommendation, she verbalized understanding.

## 2018-03-09 NOTE — Telephone Encounter (Signed)
Please advise 

## 2018-03-22 ENCOUNTER — Ambulatory Visit: Payer: BLUE CROSS/BLUE SHIELD | Admitting: Physical Therapy

## 2018-04-11 ENCOUNTER — Ambulatory Visit: Payer: BLUE CROSS/BLUE SHIELD | Attending: Physician Assistant | Admitting: Physical Therapy

## 2018-04-11 ENCOUNTER — Other Ambulatory Visit: Payer: Self-pay

## 2018-04-11 DIAGNOSIS — M5442 Lumbago with sciatica, left side: Secondary | ICD-10-CM

## 2018-04-11 DIAGNOSIS — M25552 Pain in left hip: Secondary | ICD-10-CM | POA: Diagnosis present

## 2018-04-11 NOTE — Patient Instructions (Signed)
Bridge [employer] small of back into mat, maintain pelvic tilt, roll up one vertebrae at a time. Focus on engaging posterior hip muscles. Hold for _2-3 seconds. Repeat _10-30___ times. 2x per day. Copyright  VHI. All rights reserved.    Trunk: Prone Extension (Press-Ups)    Lie on stomach on firm, flat surface. Relax bottom and legs. Raise chest in air with elbows straight. Keep hips flat on surface, sag stomach. Hold _2-3___ seconds. Repeat __10__ times. Do multiple____ sessions per day. CAUTION: Movement should be gentle and slow.  Copyright  VHI. All rights reserved.    Standing Arch (Extension)    Place hands in small of back. Using hands as fulcrum, arch backward. Try to keep knees straight. Great exercise if sitting makes pain worse. Use to break up long periods of sitting. Repeat _10-30___ times. Do __2-3__ sessions per day.  http://gt2.exer.us/248   Copyright  VHI. All rights reserved.   Side Stretch    Lie on your back with your shoulders to the right and hips to the left. Move your straight right  leg to the right and then try to bring the left leg to meet it. Stop when you feel a stretch in the right low back. Hold 1-5 min. 1-2 x/day.  You DO NOT have to raise arm overhead as shown.  Solon Palm, PT 04/11/18 10:16 AM [facility] [address] [facility] [address] [facility] [address] [place], [place], [address] Phone: [phone]   Fax:  [phone]

## 2018-04-11 NOTE — Therapy (Signed)
[facility] [address] [place], [place], [address] Phone: [phone]   Fax:  [phone]  Physical Therapy Evaluation  Patient Details  Name: Tonya Perry MRN: [id number] Date of Birth: 04-28-1975 Referring Provider (PT): McVey PA-C   Encounter Date: 04/11/2018  PT End of Session - 04/11/18 0935    Visit Number  1    Date for PT Re-Evaluation  06/06/18    PT Start Time  0936    PT Stop Time  1017    PT Time Calculation (min)  41 min    Activity Tolerance  Patient tolerated treatment well    Behavior During Therapy  [facility] for tasks assessed/performed       Past Medical History:  Diagnosis Date  . Anxiety   . Arthritis   . Asthma   . Depression   . Fibromyalgia   . History of chlamydia 09/06/2017  . History of trichomoniasis 09/06/2017   + RNA test, neg wet prep in office POCT  . Iron deficiency anemia   . Kienbck's disease    LEFT WRIST  . Left wrist pain    retained deep implant    Past Surgical History:  Procedure Laterality Date  . LAPAROSCOPIC APPENDECTOMY N/A 11/04/2013   Procedure: APPENDECTOMY LAPAROSCOPIC;  Surgeon: Valarie MerinoMatthew B Martin, MD;  Location: [facility];  Service: General;  Laterality: N/A;  . REMOVAL OF IMPLANT Left 06/13/2015   Procedure: LEFT WRIST DEEP IMPLANT REMOVAL ;  Surgeon: Bradly BienenstockFred Ortmann, MD;  [facility];  Service: Orthopedics;  Laterality: Left;  . WRIST RECONSTRUCTION Left 04-15-2015    for Kienbock's disease     There were no vitals filed for this visit.   Subjective Assessment - 04/11/18 0943    Subjective  Patient began feeling left hip pain in Aug or Sept 2019. She also reports low back pain and that the pain goes down to her knee most of the time. She works in housekeeping. Pain is all the time whether she is sitting or moving.     Pertinent History  Patella alta bil, asthma, anxiety, depression, fibromyalgia    Limitations  Sitting    How long  can you sit comfortably?  5 min    Diagnostic tests  xrays lumbar and left hip negative    Patient Stated Goals  to get rid of pain    Currently in Pain?  Yes    Pain Score  6     Pain Location  Back    Pain Orientation  Left    Pain Descriptors / Indicators  Aching    Pain Type  Acute pain    Pain Radiating Towards  to left hip and knee    Pain Onset  More than a month ago    Pain Frequency  Constant    Aggravating Factors   unsure    Pain Relieving Factors  heat    Effect of Pain on Daily Activities  everything hurts         [facility] Assessment - 04/11/18 0001      Assessment   Medical Diagnosis  left hip pain; low back pain with sciatica    Referring Provider (PT)  McVey PA-C    Onset Date/Surgical Date  11/18/17    Next MD Visit  none scheduled    Prior Therapy  for wrist      Precautions   Precautions  None  Restrictions   Weight Bearing Restrictions  No      Balance Screen   Has the patient fallen in the past 6 months  Yes    How many times?  1   slipped on water in bathroom and landed on Right buttock   Has the patient had a decrease in activity level because of a fear of falling?   No    Is the patient reluctant to leave their home because of a fear of falling?   No      Home Environment   Living Environment  Private residence    Type of Home  House    Home Layout  One level      Prior Function   Level of Independence  Independent    Vocation  Full time employment    Vocation Requirements  housekeeping    Leisure  dancing      Posture/Postural Control   Posture Comments  left hip IR, increased lumbar lordosis      ROM / Strength   AROM / PROM / Strength  AROM;Strength      AROM   Overall AROM Comments  Lumbar WNL; pull in left ant hip with right SB; generally hypermobile in BLE      Strength   Overall Strength Comments  grossly 5/5 in BLE except left hip flexion 4-/5 with pain,       Flexibility   Soft Tissue Assessment /Muscle Length  yes     Hamstrings  hyperflexible bil    ITB  WNL    Piriformis  WNL    Quadratus Lumborum  tight on left      Palpation   Spinal mobility  decreased PA in L3-5 on left but painful bil    SI assessment   neg    Palpation comment  tender in left TFL, ITB, hip flexor, bil lumbar paraspinals and SIJ      Special Tests   Other special tests  positve for lumbar instability, neg gapping test for SI                Objective measurements completed on examination: See above findings.      [facility] Adult PT Treatment/Exercise - 04/11/18 0001      Self-Care   Self-Care  ADL's    ADL's  avoiding soft chairs; trying to keep back straight with job; adding in extension             PT Education - 04/11/18 1313    Education Details  HEP; self care    Person(s) Educated  Patient    Methods  Explanation;Demonstration;Verbal cues;Handout    Comprehension  Returned demonstration;Verbalized understanding       PT Short Term Goals - 04/11/18 1314      PT SHORT TERM GOAL #1   Title  Ind with initial HEP    Time  2    Period  Weeks    Status  New    Target Date  04/25/18      PT SHORT TERM GOAL #2   Title  decreased pain in left low back and hip by 25% or more    Time  3    Period  Weeks    Status  New    Target Date  05/02/18        PT Long Term Goals - 04/11/18 1315      PT LONG TERM GOAL #1   Title  decreased pain in  the low back and LLE by 75% or more with ADLS.    Time  8    Period  Weeks    Status  New    Target Date  06/06/18      PT LONG TERM GOAL #2   Title  Patient able to stand without internal rotation of left hip.    Time  8    Period  Weeks    Status  New      PT LONG TERM GOAL #3   Title  Patient able to sit for 30 minutes without having to get up from pain.    Time  8    Period  Weeks    Status  New      PT LONG TERM GOAL #4   Title  Patient able to return to dancing with 2/10 pain or less in the low back and LLE.    Time  8    Period  Weeks     Status  New             Plan - 04/11/18 1255    Clinical Impression Statement  Patient presents with complaints of left low back and hip pain that refers down to left knee. The pain is constant. 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 She reported some relief with extension in standing, but no change with prone press ups. 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 She demonstrates instability in her lumbar spine while also having increased tone reducing PA mobility on the left. She has a tight TFL and hip flexors on the left but could not tolerate a stretch today. She will benefit from lumbar stabilization exercises as well as an extension protocol.    History and Personal Factors relevant to plan of care:  Patella alta bil, asthma, anxiety, depression, fibromyalgia    Clinical Presentation  Evolving    Clinical Decision Making  Low    Rehab Potential  Good    PT Frequency  2x / week    PT Duration  8 weeks    PT Treatment/Interventions  ADLs/Self Care Home Management;Cryotherapy;Electrical Stimulation;Moist Heat;Traction;Ultrasound;Therapeutic activities;Therapeutic exercise;Neuromuscular re-education;Patient/family education;Manual techniques;Dry needling;Taping    PT Next Visit Plan  Review ADL/body mechanics for avoiding flexion; release left TFL, hip flexor, QL; lumbar stab/core strength    PT Home Exercise Plan  bridging, prone press ups, standing extension, supine reverse C stretch for QL    Consulted and Agree with Plan of Care  Patient       Patient will benefit from skilled therapeutic intervention in order to improve the following deficits and impairments:  Pain, Decreased mobility, Hypermobility, Postural dysfunction, Decreased strength  Visit Diagnosis: Acute left-sided low back pain with left-sided sciatica - Plan: PT plan of care cert/re-cert  Pain in left hip - Plan: PT plan of care  cert/re-cert     Problem List Patient Active Problem List   Diagnosis Date Noted  . 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 Degeneration, intervertebral disc, lumbosacral 12/28/2014  . 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 Fibromyalgia 10/27/2014  . Asthma, mild intermittent, uncomplicated 08/22/2014  . Chronic allergic conjunctivitis 08/22/2014  . 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 Rash and nonspecific skin eruption 08/22/2014  . Depression 07/18/2014  . Anxiety 07/18/2014  . Counseling on substance use and abuse 07/18/2014  . Arthritis 07/18/2014  . Allergic rhinitis 01/22/2014    Solon Palm PT 04/11/2018, 1:22 PM  [facility] [address] [place], [place], [address] Phone: [phone]   Fax:  [phone]  Name: Tonya Perry MRN: [id number] Date of Birth: 08-13-1975

## 2018-04-15 ENCOUNTER — Ambulatory Visit: Payer: BLUE CROSS/BLUE SHIELD

## 2018-04-19 ENCOUNTER — Telehealth: Payer: Self-pay | Admitting: Family Medicine

## 2018-04-19 NOTE — Telephone Encounter (Signed)
Copied from CRM [id number]. Topic: Quick Communication - See Telephone Encounter >> Apr 19, 2018 11:20 AM Herby Abraham C wrote: CRM for notification. See Telephone encounter for: 04/19/18.  Pt called in to request a pain medication and also possibly a MRI. Pt says that she is having a lot of hip pain. Pt says that she seen McVey and was prescribed some medication, pt says that medication isn't helping. Pt would like to be advised / assisted as soon as possible .  Pharmacy: [id number] - [place], [place] - [address] [phone] (Phone) [phone] (Fax)   CB:[phone]  for confirmation of things.

## 2018-04-22 ENCOUNTER — Ambulatory Visit: Payer: BLUE CROSS/BLUE SHIELD | Admitting: Physical Therapy

## 2018-04-22 NOTE — Telephone Encounter (Signed)
Spoke with patient, she has started PT this month. She will go back today for a second time. She is requesting a MRI, that she can get for free. She thinks that she has a slipped disc. She is also requesting pain medication. She stated that the Cyclobenzaprine "it is helping" and takingTylenol along with that. She is requesting a stronger medication. Pls advise. thanks

## 2018-04-25 ENCOUNTER — Telehealth: Payer: Self-pay | Admitting: Family Medicine

## 2018-04-25 NOTE — Telephone Encounter (Signed)
Copied from CRM [id number]. Topic: Quick Communication - See Telephone Encounter >> Apr 25, 2018 10:06 AM Burchel, Abbi R wrote: CRM for notification. See Telephone encounter for: 04/25/18.  Pt requesting a referral for MRI of her hip and lower back.

## 2018-04-25 NOTE — Telephone Encounter (Signed)
Please let the patient know that I have reviewed her chart and saw the physical therapy notes and she needs a follow up appointment. I have two same day slots left tomorrow.

## 2018-04-25 NOTE — Telephone Encounter (Signed)
Pt is calling to f/up on this request. Pt states she is taking all the medication she was instructed to take and is doing her PT exercises, but is still in severe pain all the time.  Pt is upset that she is not getting any relief. Please advise.

## 2018-04-26 NOTE — Telephone Encounter (Signed)
Please adv

## 2018-04-26 NOTE — Telephone Encounter (Signed)
Appointment with 04/27/2018 will be addressed.

## 2018-04-27 ENCOUNTER — Other Ambulatory Visit: Payer: Self-pay

## 2018-04-27 ENCOUNTER — Encounter: Payer: Self-pay | Admitting: Emergency Medicine

## 2018-04-27 ENCOUNTER — Ambulatory Visit (INDEPENDENT_AMBULATORY_CARE_PROVIDER_SITE_OTHER): Payer: BLUE CROSS/BLUE SHIELD | Admitting: Emergency Medicine

## 2018-04-27 VITALS — BP 128/86 | HR 98 | Temp 98.6°F | Resp 18 | Ht 63.5 in | Wt 210.4 lb

## 2018-04-27 DIAGNOSIS — M25552 Pain in left hip: Secondary | ICD-10-CM | POA: Diagnosis not present

## 2018-04-27 DIAGNOSIS — G8929 Other chronic pain: Secondary | ICD-10-CM | POA: Diagnosis not present

## 2018-04-27 DIAGNOSIS — M545 Low back pain, unspecified: Secondary | ICD-10-CM

## 2018-04-27 MED ORDER — ALBUTEROL SULFATE HFA 108 (90 BASE) MCG/ACT IN AERS: 2.0000 | INHALATION_SPRAY | RESPIRATORY_TRACT | 5 refills | Status: DC | PRN

## 2018-04-27 MED ORDER — CLOTRIMAZOLE-BETAMETHASONE 1-0.05 % EX CREA: 1.0000 "application " | TOPICAL_CREAM | Freq: Two times a day (BID) | CUTANEOUS | 1 refills | Status: DC

## 2018-04-27 MED ORDER — FLUTICASONE PROPIONATE 50 MCG/ACT NA SUSP: 2.0000 | Freq: Every day | NASAL | 11 refills | Status: DC

## 2018-04-27 MED ORDER — CYCLOBENZAPRINE HCL ER 15 MG PO CP24: 15.0000 mg | ORAL_CAPSULE | Freq: Every day | ORAL | 1 refills | Status: DC | PRN

## 2018-04-27 MED ORDER — CETIRIZINE HCL 10 MG PO TABS: 10.0000 mg | ORAL_TABLET | Freq: Every day | ORAL | 3 refills | Status: DC

## 2018-04-27 MED ORDER — IBUPROFEN 800 MG PO TABS: 800.0000 mg | ORAL_TABLET | Freq: Three times a day (TID) | ORAL | 0 refills | Status: AC | PRN

## 2018-04-27 MED ORDER — EPINEPHRINE 0.3 MG/0.3ML IJ SOAJ: 0.3000 mg | INTRAMUSCULAR | 1 refills | Status: DC | PRN

## 2018-04-27 NOTE — Progress Notes (Signed)
Tonya Perry 43 y.o.   Chief Complaint  Patient presents with  . Hip Pain    follow up   . Medication Refill    zyrtec, amrix, epipen, flonase     HISTORY OF PRESENT ILLNESS: This is a 43 y.o. female complaining of chronic pain to her left hip.  Hurting more the past several days.  Has a history of hyperflexibility syndrome so physical therapy has not been much help. Also requesting medication refills for the above listed medications. HPI   Prior to Admission medications   Medication Sig Start Date End Date Taking? Authorizing Provider  acetaminophen (TYLENOL) 500 MG tablet Take 500 mg by mouth.   Yes [provider]  albuterol (PROAIR HFA) 108 (90 Base) MCG/ACT inhaler Inhale 2 puffs into the lungs as needed for wheezing (2 puffs as needed every 4-6h as needed for cough/wheeze). 02/25/16  Yes [name], MD  cetirizine (ZYRTEC) 10 MG tablet Take 1 tablet (10 mg total) by mouth at bedtime. Patient taking differently: Take 10 mg by mouth every morning.  08/07/14  Yes [name], MD  chlorhexidine (PERIDEX) 0.12 % solution Use as directed 10 mLs in the mouth or throat 2 (two) times daily. 02/03/17  Yes [name], MD  clotrimazole-betamethasone (LOTRISONE) cream Apply 1 application topically 2 (two) times daily. 09/06/17  Yes [name], MD  cyclobenzaprine (AMRIX) 15 MG 24 hr capsule Take 1-2 capsules (15-30 mg total) by mouth daily as needed for muscle spasms. 03/05/18  Yes McVey, Madelaine Bhat, PA-C  EPIPEN 2-PAK 0.3 MG/0.3ML SOAJ injection Inject 0.3 mg as directed as needed (systemic reaction to seafood allergy).  08/22/14  Yes Scharlene Gloss, MD  fluticasone [facility]) 50 MCG/ACT nasal spray Place 2 sprays into both nostrils daily. Patient taking differently: Place 2 sprays into both nostrils daily as needed.  07/06/14  Yes [name], MD  gabapentin (NEURONTIN) 600 MG tablet Take 1 tab po qam and 1 tab po qafternoon and 2 tabs po qhs 07/01/17  Yes [name],  MD  ibuprofen (ADVIL,MOTRIN) 200 MG tablet Take 200 mg by mouth every 6 (six) hours as needed.   Yes [provider]  lamoTRIgine (LAMICTAL) 25 MG tablet Take 25 mg by mouth 3 (three) times daily.  11/09/16  Yes Tamela Oddi, PA-C  Multiple Vitamins-Minerals (MULTIVITAMIN WITH MINERALS) tablet Take 1 tablet by mouth daily.   Yes [provider]  sertraline (ZOLOFT) 100 MG tablet sertraline 100 mg tablet   Yes [provider]  traZODone (DESYREL) 50 MG tablet Take 50 mg by mouth at bedtime.   Yes [provider]  VYVANSE 30 MG capsule daily.  11/09/16  Yes [provider]  fish oil-omega-3 fatty acids 1000 MG capsule Take 1 g by mouth daily.    [provider]  nystatin (MYCOSTATIN/NYSTOP) powder Apply topically 4 (four) times daily. Patient not taking: Reported on 04/27/2018 09/06/17   [name], MD    Allergies  Allergen Reactions  . Azithromycin Diarrhea  . Hydrocodone-Acetaminophen Itching  . Latex Itching  . Other Itching and Other (See Comments)    Peas, Pineapple, Mango, Melons, Cantelope, Watermelons.  Dust mites, trees, grass and pollen.  Causes throat to swell with itching. Tree nuts, causes throat to swell with itching.  . Shellfish Allergy Itching and Swelling  . Voltaren [Diclofenac Sodium] Other (See Comments)    Upset stomach; made pt feel like had to go to bathroom  .  Penicillins Rash    Patient Active Problem List   Diagnosis Date Noted  . 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 Degeneration, intervertebral disc, lumbosacral 12/28/2014  . 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 Fibromyalgia 10/27/2014  . Asthma, mild intermittent, uncomplicated 08/22/2014  . Chronic allergic conjunctivitis 08/22/2014  . 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 Rash and nonspecific skin eruption 08/22/2014  . Depression 07/18/2014  . Anxiety 07/18/2014  . Counseling on substance use and abuse 07/18/2014  . Arthritis 07/18/2014  . Allergic rhinitis  01/22/2014    Past Medical History:  Diagnosis Date  . Anxiety   . Arthritis   . Asthma   . Depression   . Fibromyalgia   . History of chlamydia 09/06/2017  . History of trichomoniasis 09/06/2017   + RNA test, neg wet prep in office POCT  . Iron deficiency anemia   . Kienbck's disease    LEFT WRIST  . Left wrist pain    retained deep implant    Past Surgical History:  Procedure Laterality Date  . LAPAROSCOPIC APPENDECTOMY N/A 11/04/2013   Procedure: APPENDECTOMY LAPAROSCOPIC;  Surgeon: Valarie Merino, MD;  Location: [facility];  Service: General;  Laterality: N/A;  . REMOVAL OF IMPLANT Left 06/13/2015   Procedure: LEFT WRIST DEEP IMPLANT REMOVAL ;  Surgeon: Bradly Bienenstock, MD;  Location: [facility];  Service: Orthopedics;  Laterality: Left;  . WRIST RECONSTRUCTION Left 04-15-2015    for Kienbock's disease     Social History   Socioeconomic History  . Marital status: Married    Spouse name: [name]  . Number of children: 1  . Years of education: Assoc  . Highest education level: Not on file  Occupational History    Comment: not employed  Social Needs  . Financial resource strain: Not on file  . Food insecurity:    Worry: Not on file    Inability: Not on file  . Transportation needs:    Medical: Not on file    Non-medical: Not on file  Tobacco Use  . Smoking status: Former Smoker    Years: 5.00    Last attempt to quit: 04/06/1996    Years since quitting: 22.0  . Smokeless tobacco: Never Used  Substance and Sexual Activity  . Alcohol use: Yes    Alcohol/week: 0.0 standard drinks    Comment: occas.  . Drug use: No  . Sexual activity: Not on file    Comment: 2015-- Placement Mirena IUD  Lifestyle  . Physical activity:    Days per week: Not on file    Minutes per session: Not on file  . Stress: Not on file  Relationships  . Social connections:    Talks on phone: Not on file    Gets together: Not on file    Attends religious service: Not on file     Active member of club or organization: Not on file    Attends meetings of clubs or organizations: Not on file    Relationship status: Not on file  . Intimate partner violence:    Fear of current or ex partner: Not on file    Emotionally abused: Not on file    Physically abused: Not on file    Forced sexual activity: Not on file  Other Topics Concern  . Not on file  Social History Narrative   Patient consumes 4-5 cups of caffeine daily.    Family History  Problem Relation Age of Onset  . Depression Mother   . Diabetes  Mother   . Mental illness Mother   . COPD Father   . Mental illness Father   . Hypertension Father   . Hyperlipidemia Father   . Depression Maternal Grandmother   . Mental illness Maternal Grandmother   . Diabetes Maternal Grandfather   . Mental illness Maternal Grandfather   . Cancer Paternal Grandfather   . Mental illness Paternal Grandfather   . Mental illness Sister   . Mental illness Sister      Review of Systems  Constitutional: Negative.  Negative for fever.  HENT: Negative.  Negative for sore throat.   Eyes: Negative.   Respiratory: Negative.  Negative for cough and shortness of breath.   Cardiovascular: Negative.  Negative for chest pain and palpitations.  Gastrointestinal: Negative.  Negative for abdominal pain, diarrhea, nausea and vomiting.  Genitourinary: Negative.  Negative for dysuria and hematuria.  Musculoskeletal: Positive for joint pain (Chronic left hip pain).  Skin: Negative.  Negative for rash.  Neurological: Negative for dizziness and headaches.  Endo/Heme/Allergies: Negative.     Vitals:   04/27/18 1043  BP: 128/86  Pulse: 98  Resp: 18  Temp: 98.6 F (37 C)  SpO2: 94%    Physical Exam Vitals signs reviewed.  HENT:     Head: Normocephalic.     Mouth/Throat:     Mouth: Mucous membranes are moist.  Eyes:     Extraocular Movements: Extraocular movements intact.     Conjunctiva/sclera: Conjunctivae normal.     Pupils:  Pupils are equal, round, and reactive to light.  Neck:     Musculoskeletal: Normal range of motion and neck supple.  Cardiovascular:     Rate and Rhythm: Normal rate.     Heart sounds: Normal heart sounds.  Pulmonary:     Effort: Pulmonary effort is normal.     Breath sounds: Normal breath sounds.  Musculoskeletal:     Comments: Left hip: Mild tenderness.  Full range of motion.  Skin:    General: Skin is warm.     Capillary Refill: Capillary refill takes less than 2 seconds.  Neurological:     General: No focal deficit present.     Mental Status: She is alert and oriented to person, place, and time.  Psychiatric:        Mood and Affect: Mood normal.        Behavior: Behavior normal.    A total of 25 minutes was spent in the room with the patient, greater than 50% of which was in counseling/coordination of care regarding chronic medical problems, medications, treatment, and need for follow-up.    ASSESSMENT & PLAN: Maciah was seen today for hip pain and medication refill.  Diagnoses and all orders for this visit:  Chronic left hip pain -     Ambulatory referral to Orthopedic Surgery -     ibuprofen (ADVIL,MOTRIN) 800 MG tablet; Take 1 tablet (800 mg total) by mouth every 8 (eight) hours as needed.  Left-sided low back pain without sciatica, unspecified chronicity -     cyclobenzaprine (AMRIX) 15 MG 24 hr capsule; Take 1-2 capsules (15-30 mg total) by mouth daily as needed for muscle spasms.  Other orders -     EPINEPHrine (EPIPEN 2-PAK) 0.3 mg/0.3 mL IJ SOAJ injection; Inject 0.3 mLs (0.3 mg total) into the skin as needed (systemic reaction to seafood allergy). -     cetirizine (ZYRTEC) 10 MG tablet; Take 1 tablet (10 mg total) by mouth at bedtime. -  albuterol (PROAIR HFA) 108 (90 Base) MCG/ACT inhaler; Inhale 2 puffs into the lungs as needed for wheezing (2 puffs as needed every 4-6h as needed for cough/wheeze). -     fluticasone (FLONASE) 50 MCG/ACT nasal spray; Place 2  sprays into both nostrils daily. -     clotrimazole-betamethasone (LOTRISONE) cream; Apply 1 application topically 2 (two) times daily.    Patient Instructions       If you have lab work done today you will be contacted with your lab results within the next 2 weeks.  If you have not heard from us then please contact us. The fastest way to get your results is to register for My Chart.   IF you received an x-ray today, you will receive an invoice from [facility]. Please contact [facility] at [phone] with questions or concerns regarding your invoice.   IF you received labwork today, you will receive an invoice from [facility]. Please contact [facility] at [phone] with questions or concerns regarding your invoice.   Our billing staff will not be able to assist you with questions regarding bills from these companies.  You will be contacted with the lab results as soon as they are available. The fastest way to get your results is to activate your My Chart account. Instructions are located on the last page of this paperwork. If you have not heard from us regarding the results in 2 weeks, please contact this office.     Hip Pain  The hip is the joint between the upper legs and the lower pelvis. The bones, cartilage, tendons, and muscles of your hip joint support your body and allow you to move around. Hip pain can range from a minor ache to severe pain in one or both of your hips. The pain may be felt on the inside of the hip joint near the groin, or the outside near the buttocks and upper thigh. You may also have swelling or stiffness. Follow these instructions at home: Managing pain, stiffness, and swelling  If directed, apply ice to the injured area. ? Put ice in a plastic bag. ? Place a towel between your skin and the bag. ? Leave the ice on for 20 minutes, 2-3 times a day  Sleep with a pillow between your legs on your most comfortable side.  Avoid any  activities that cause pain. General instructions  Take over-the-counter and prescription medicines only as told by your health care provider.  Do any exercises as told by your health care provider.  Record the following: ? How often you have hip pain. ? The location of your pain. ? What the pain feels like. ? What makes the pain worse.  Keep all follow-up visits as told by your health care provider. This is important. Contact a health care provider if:  You cannot put weight on your leg.  Your pain or swelling continues or gets worse after one week.  It gets harder to walk.  You have a fever. Get help right away if:  You fall.  You have a sudden increase in pain and swelling in your hip.  Your hip is red or swollen or very tender to touch. Summary  Hip pain can range from a minor ache to severe pain in one or both of your hips.  The pain may be felt on the inside of the hip joint near the groin, or the outside near the buttocks and upper thigh.  Avoid any activities that cause pain.  Record how often you have hip pain, the location of the pain, what makes it worse and what it feels like. This information is not intended to replace advice given to you by your health care provider. Make sure you discuss any questions you have with your health care provider. Document Released: 09/10/2009 Document Revised: 02/24/2016 Document Reviewed: 02/24/2016 Elsevier Interactive Patient Education  2019 [employer].      Edwina BarthMiguel Sanda Dejoy, MD [facility]

## 2018-04-27 NOTE — Patient Instructions (Addendum)
     If you have lab work done today you will be contacted with your lab results within the next 2 weeks.  If you have not heard from us then please contact us. The fastest way to get your results is to register for My Chart.   IF you received an x-ray today, you will receive an invoice from [facility]. Please contact [facility] at [phone] with questions or concerns regarding your invoice.   IF you received labwork today, you will receive an invoice from [facility]. Please contact [facility] at [phone] with questions or concerns regarding your invoice.   Our billing staff will not be able to assist you with questions regarding bills from these companies.  You will be contacted with the lab results as soon as they are available. The fastest way to get your results is to activate your My Chart account. Instructions are located on the last page of this paperwork. If you have not heard from us regarding the results in 2 weeks, please contact this office.     Hip Pain  The hip is the joint between the upper legs and the lower pelvis. The bones, cartilage, tendons, and muscles of your hip joint support your body and allow you to move around. Hip pain can range from a minor ache to severe pain in one or both of your hips. The pain may be felt on the inside of the hip joint near the groin, or the outside near the buttocks and upper thigh. You may also have swelling or stiffness. Follow these instructions at home: Managing pain, stiffness, and swelling  If directed, apply ice to the injured area. ? Put ice in a plastic bag. ? Place a towel between your skin and the bag. ? Leave the ice on for 20 minutes, 2-3 times a day  Sleep with a pillow between your legs on your most comfortable side.  Avoid any activities that cause pain. General instructions  Take over-the-counter and prescription medicines only as told by your health care provider.  Do any exercises as told  by your health care provider.  Record the following: ? How often you have hip pain. ? The location of your pain. ? What the pain feels like. ? What makes the pain worse.  Keep all follow-up visits as told by your health care provider. This is important. Contact a health care provider if:  You cannot put weight on your leg.  Your pain or swelling continues or gets worse after one week.  It gets harder to walk.  You have a fever. Get help right away if:  You fall.  You have a sudden increase in pain and swelling in your hip.  Your hip is red or swollen or very tender to touch. Summary  Hip pain can range from a minor ache to severe pain in one or both of your hips.  The pain may be felt on the inside of the hip joint near the groin, or the outside near the buttocks and upper thigh.  Avoid any activities that cause pain.  Record how often you have hip pain, the location of the pain, what makes it worse and what it feels like. This information is not intended to replace advice given to you by your health care provider. Make sure you discuss any questions you have with your health care provider. Document Released: 09/10/2009 Document Revised: 02/24/2016 Document Reviewed: 02/24/2016 Elsevier Interactive Patient Education  2019 [employer]  

## 2018-04-29 ENCOUNTER — Ambulatory Visit: Payer: BLUE CROSS/BLUE SHIELD | Admitting: Physical Therapy

## 2018-05-06 ENCOUNTER — Ambulatory Visit: Payer: BLUE CROSS/BLUE SHIELD | Admitting: Physical Therapy

## 2018-05-10 ENCOUNTER — Ambulatory Visit: Payer: BLUE CROSS/BLUE SHIELD | Attending: Physician Assistant | Admitting: Physical Therapy

## 2018-05-10 ENCOUNTER — Telehealth: Payer: Self-pay | Admitting: Family Medicine

## 2018-05-10 DIAGNOSIS — M25552 Pain in left hip: Secondary | ICD-10-CM | POA: Diagnosis present

## 2018-05-10 DIAGNOSIS — M5442 Lumbago with sciatica, left side: Secondary | ICD-10-CM | POA: Diagnosis present

## 2018-05-10 NOTE — Telephone Encounter (Signed)
Left VM in regards to her appt with Dr. Clelia Croft on 05/20/2018. The provider is going to be out of the office and would need to be rescheduled.

## 2018-05-10 NOTE — Therapy (Signed)
[facility], [place], [address] Phone: [phone]   Fax:  [phone]  Physical Therapy Treatment  Patient Details  Name: Tonya Perry MRN: [id number] Date of Birth: 1975/07/12 Referring Provider (PT): McVey PA-C   Encounter Date: 05/10/2018  PT End of Session - 05/10/18 1257    Visit Number  2    Date for PT Re-Evaluation  06/06/18    PT Start Time  1152    PT Stop Time  1230    PT Time Calculation (min)  38 min       Past Medical History:  Diagnosis Date  . Anxiety   . Arthritis   . Asthma   . Depression   . Fibromyalgia   . History of chlamydia 09/06/2017  . History of trichomoniasis 09/06/2017   + RNA test, neg wet prep in office POCT  . Iron deficiency anemia   . Kienbck's disease    LEFT WRIST  . Left wrist pain    retained deep implant    Past Surgical History:  Procedure Laterality Date  . LAPAROSCOPIC APPENDECTOMY N/A 11/04/2013   Procedure: APPENDECTOMY LAPAROSCOPIC;  Surgeon: Pedro Earls, MD;  Location: [facility];  Service: General;  Laterality: N/A;  . REMOVAL OF IMPLANT Left 06/13/2015   Procedure: LEFT WRIST DEEP IMPLANT REMOVAL ;  Surgeon: Iran Planas, MD;  Location: [facility];  Service: Orthopedics;  Laterality: Left;  . WRIST RECONSTRUCTION Left 04-15-2015    for Kienbock's disease     There were no vitals filed for this visit.  Subjective Assessment - 05/10/18 1155    Subjective  pt has not been in for a month since eval, cancelled 3 times. pt verb active person- dance at home. pt verb doing exercises at home ( bridges) but no better.. pt verb can not sleep. pt with various and many complaints.    Currently in Pain?  Yes    Pain Score  6     Pain Location  Back                       [facility] Adult PT Treatment/Exercise - 05/10/18 0001      Exercises   Exercises  Lumbar;Knee/Hip      Lumbar Exercises: Supine   Ab Set   15 reps;3 seconds   with ball   Other Supine Lumbar Exercises  KTC,obl and bridge with ball 15 each   Leg cramping     Knee/Hip Exercises: Aerobic   Nustep  L 3 1 min- stopped d/t knee pain      Knee/Hip Exercises: Seated   Long Arc Quad  Strengthening;Both;15 reps   green tband   Ball Squeeze  15 3 sec hold    Clamshell with TheraBand  Green   15   Marching  Both;15 reps   green tband hold 3 sec            PT Education - 05/10/18 1211    Education Details  Lumbar Ball ex. Green tband LAQ,hip flex and hip abd. add ball squeeze    Person(s) Educated  Patient    Methods  Explanation;Handout;Demonstration    Comprehension  Verbalized understanding;Returned demonstration       PT Short Term Goals - 05/10/18 1204      PT SHORT TERM GOAL #1   Title  Ind with initial HEP    Status  Achieved  PT SHORT TERM GOAL #2   Title  decreased pain in left low back and hip by 25% or more    Baseline  pt has not been since eval and reports no changes    Status  On-going        PT Long Term Goals - 04/11/18 1315      PT LONG TERM GOAL #1   Title  decreased pain in the low back and LLE by 75% or more with ADLS.    Time  8    Period  Weeks    Status  New    Target Date  06/06/18      PT LONG TERM GOAL #2   Title  Patient able to stand without internal rotation of left hip.    Time  8    Period  Weeks    Status  New      PT LONG TERM GOAL #3   Title  Patient able to sit for 30 minutes without having to get up from pain.    Time  8    Period  Weeks    Status  New      PT LONG TERM GOAL #4   Title  Patient able to return to dancing with 2/10 pain or less in the low back and LLE.    Time  8    Period  Weeks    Status  New            Plan - 05/10/18 1258    Clinical Impression Statement  Pt arrived back to PT after cancelling appts 3 weeks in a row. Pt verb doingbridges at home but no better. Pt verb very active person but always has pain,. Pt with multi  complaints thru session from pain to cramps,modified tx accordingly. Spent time educ pt on need for strengthening d/t weakness with left hip flex, fibro and hypermobility- isssued HEP to work on for next week to build routine. 1 41f2 STGs met    PT Treatment/Interventions  ADLs/Self Care Home Management;Cryotherapy;Electrical Stimulation;Moist Heat;Traction;Ultrasound;Therapeutic activities;Therapeutic exercise;Neuromuscular re-education;Patient/family education;Manual techniques;Dry needling;Taping    PT Next Visit Plan  check HEP compliance and progress as tolerated       Patient will benefit from skilled therapeutic intervention in order to improve the following deficits and impairments:  Pain, Decreased mobility, Hypermobility, Postural dysfunction, Decreased strength  Visit Diagnosis: Acute left-sided low back pain with left-sided sciatica  Pain in left hip     Problem List Patient Active Problem List   Diagnosis Date Noted  . 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 Degeneration, intervertebral disc, lumbosacral 12/28/2014  . 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 Fibromyalgia 10/27/2014  . Asthma, mild intermittent, uncomplicated [id number] . Chronic allergic conjunctivitis 08/22/2014  . 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 Rash and nonspecific skin eruption 08/22/2014  . Depression 07/18/2014  . Anxiety 07/18/2014  . Counseling on substance use and abuse 07/18/2014  . Arthritis 07/18/2014  . Allergic rhinitis 01/22/2014    ,ANGIE PTA 05/10/2018, 1:01 PM  [address] [address]Phone: [phone]  Fax:  [phone] Name: Tonya BHARGAVAMRN: [id number] of Birth: [birth year]

## 2018-05-18 ENCOUNTER — Ambulatory Visit: Payer: BLUE CROSS/BLUE SHIELD | Admitting: Physical Therapy

## 2018-05-20 ENCOUNTER — Ambulatory Visit: Payer: Medicare Other | Admitting: Family Medicine

## 2018-05-23 ENCOUNTER — Ambulatory Visit: Payer: BLUE CROSS/BLUE SHIELD | Admitting: Physical Therapy

## 2018-05-25 ENCOUNTER — Ambulatory Visit: Payer: BLUE CROSS/BLUE SHIELD | Admitting: Physical Therapy

## 2018-06-02 ENCOUNTER — Ambulatory Visit: Payer: BLUE CROSS/BLUE SHIELD | Admitting: Physical Therapy

## 2018-06-04 ENCOUNTER — Telehealth: Payer: Self-pay | Admitting: Family Medicine

## 2018-06-04 NOTE — Telephone Encounter (Signed)
mychart message sent to pt about appointment with Dr Shaw °

## 2018-06-14 ENCOUNTER — Ambulatory Visit: Payer: Medicare Other | Admitting: Family Medicine

## 2018-08-18 DIAGNOSIS — G8929 Other chronic pain: Secondary | ICD-10-CM | POA: Insufficient documentation

## 2019-03-01 ENCOUNTER — Other Ambulatory Visit: Payer: Self-pay | Admitting: Emergency Medicine

## 2019-03-01 NOTE — Telephone Encounter (Signed)
Requested medication (s) are due for refill today: no  Requested medication (s) are on the active medication list: yes  Last refill:  02/02/2019  Future visit scheduled: no  Notes to clinic:  Review for refill Overdue for office visit    Requested Prescriptions  Pending Prescriptions Disp Refills   fluticasone (FLONASE) 50 MCG/ACT nasal spray [Pharmacy Med Name: FLUTICASONE PROP 50 MCG SPRAY] 48 mL 3    Sig: SPRAY 2 SPRAYS INTO EACH NOSTRIL EVERY DAY     Ear, Nose, and Throat: Nasal Preparations - Corticosteroids Passed - 03/01/2019 10:20 AM      Passed - Valid encounter within last 12 months    Recent Outpatient Visits          10 months ago Chronic left hip pain   [facility] at [facility], Ines Bloomer, MD   12 months ago Left hip pain   Primary Care at [facility], Gelene Mink, PA-C   1 year ago Exposure to STD   [facility] at [facility], Fenton Malling, MD   1 year ago Routine screening for STI (sexually transmitted infection)   [facility] at [facility], Laurey Arrow, MD   1 year ago Fibromyalgia   [facility] at [facility], Laurey Arrow, MD

## 2019-06-12 ENCOUNTER — Other Ambulatory Visit: Payer: Self-pay | Admitting: Emergency Medicine

## 2019-07-18 ENCOUNTER — Other Ambulatory Visit: Payer: Self-pay | Admitting: Emergency Medicine

## 2019-07-18 NOTE — Telephone Encounter (Signed)
Please call to schedule establish care with another physician in the practice , last seen 04/2018.

## 2019-07-18 NOTE — Telephone Encounter (Signed)
Requested medication (s) are due for refill today:   Yes  Requested medication (s) are on the active medication list:   Yes  Future visit scheduled:   No  Needs office visit to establish with new provider.  Note was no refills until this was done.     Last ordered: 05/02/2018  48 ml  0 refills    Requested Prescriptions  Pending Prescriptions Disp Refills   fluticasone (FLONASE) 50 MCG/ACT nasal spray [Pharmacy Med Name: FLUTICASONE PROP 50 MCG SPRAY] 48 mL 0    Sig: SPRAY 2 SPRAYS INTO EACH NOSTRIL EVERY DAY      Ear, Nose, and Throat: Nasal Preparations - Corticosteroids Failed - 07/18/2019  4:11 PM      Failed - Valid encounter within last 12 months    Recent Outpatient Visits           1 year ago Chronic left hip pain   [facility] at [facility], Eilleen Kempf, MD   1 year ago Left hip pain   Primary Care at [facility], Madelaine Bhat, PA-C   1 year ago Exposure to STD   [facility] at [facility], Sandria Bales, MD   1 year ago Routine screening for STI (sexually transmitted infection)   [facility] at [facility], Levell July, MD   2 years ago Fibromyalgia   [facility] at [facility], Levell July, MD

## 2019-07-20 NOTE — Telephone Encounter (Signed)
Spoke with pt and refused appt. Pt still seeing Dr. Clelia Croft at new practice

## 2019-12-12 ENCOUNTER — Other Ambulatory Visit: Payer: Self-pay | Admitting: Family Medicine

## 2019-12-12 DIAGNOSIS — E010 Iodine-deficiency related diffuse (endemic) goiter: Secondary | ICD-10-CM

## 2019-12-21 ENCOUNTER — Ambulatory Visit
Admission: RE | Admit: 2019-12-21 | Discharge: 2019-12-21 | Disposition: A | Discharge status: Home / Self Care | Payer: PRIVATE HEALTH INSURANCE | Source: Ambulatory Visit | Attending: Family Medicine | Admitting: Family Medicine

## 2019-12-21 DIAGNOSIS — E010 Iodine-deficiency related diffuse (endemic) goiter: Secondary | ICD-10-CM

## 2020-01-18 ENCOUNTER — Encounter: Payer: Self-pay | Admitting: Neurology

## 2020-01-18 ENCOUNTER — Ambulatory Visit (INDEPENDENT_AMBULATORY_CARE_PROVIDER_SITE_OTHER): Payer: BC Managed Care – PPO | Admitting: Neurology

## 2020-01-18 VITALS — BP 122/70 | HR 95 | Ht 64.0 in | Wt 257.0 lb

## 2020-01-18 DIAGNOSIS — G4719 Other hypersomnia: Secondary | ICD-10-CM | POA: Diagnosis not present

## 2020-01-18 DIAGNOSIS — R0683 Snoring: Secondary | ICD-10-CM | POA: Diagnosis not present

## 2020-01-18 DIAGNOSIS — R0681 Apnea, not elsewhere classified: Secondary | ICD-10-CM

## 2020-01-18 DIAGNOSIS — R519 Headache, unspecified: Secondary | ICD-10-CM | POA: Diagnosis not present

## 2020-01-18 NOTE — Patient Instructions (Signed)

## 2020-01-18 NOTE — Progress Notes (Signed)
Subjective:    Patient ID: Tonya Perry is a 44 y.o. female.  HPI     Huston Foley, MD, PhD [facility] [address], [address] [address] [place], [place] [address]  Dear Dr. Clelia Croft,   I saw your patient, Tonya Perry, upon your kind request in my sleep clinic today for initial consultation of her sleep disorder, in particular, concern for underlying obstructive sleep apnea.  The patient is unaccompanied today.  As you know, [name] is a 44 year old right-handed woman with an underlying medical history of hypertension, anxiety, reflux disease, asthma, and severe obesity with a BMI of over 40, and alcohol use disorder (by self-report), who reports snoring and excessive daytime somnolence as well as witnessed apneas per daughter's observation.  I reviewed your office note from 12/08/2019. Her Epworth sleepiness score is 14 out of 24, fatigue severity score is 57 out of 63.  Of note, she is on multiple medications including potentially sedating medications.  She is currently on gabapentin 400 mg 3 times daily, Lamictal 25 mg 3 times daily, sertraline, trazodone at bedtime long-acting cyclobenzaprine 15 to 30 mg daily.  She sees Tamela Oddi for mental health.  She is on the naltrexone for alcohol use.  She drinks approximately 6-8 beers at a time but not daily.  She lives with her 54 year old daughter, she is divorced.  They have 1 small dog in the household, 2 ferrets and 1 total.  The patient is on disability.  She goes to bed typically around 930 or 10 PM and rise time to get her daughter ready for school is 6:45 AM.  The patient denies any night to night nocturia.  She does have morning headaches fairly frequently and takes ibuprofen or Tylenol as needed.  She has gained weight in the realm of 40 to 50 pounds, primarily during this pandemic.  She drinks caffeine in the form of coffee, 2 cups in the morning and unsweetened tea, several cups during the day.  She is a  occasional smoker of marijuana and quit smoking cigarettes when she was 22.  She is not aware of any family history of obstructive sleep apnea.  She has started taking a nap in the afternoon in her recliner which is unusual for her.   I had seen her nearly 6 years ago for paresthesias.  Work-up at the time included blood work and EMG testing which were benign. Previously:   02/13/14: 44 year old right-handed woman with an underlying medical history of allergies, depression, anxiety, asthma, arthritis and history of substance abuse (long time ago, EtOH, and marijuana), who reports a one-year history of tingling and numbness in both hands, primarily in the fingers, L worse than R. She also has noted tingling and numbness in both feet in the last 3 or 4 months, mostly affecting the toes, b/l. She saw you in October and you suggested wrist splints to see if her symptoms improve in her hands. She was referred to orthopedics and physical therapy.  Neck x-ray on 02/05/2014 showed mild reversal of lordotic curvature. This finding is most likely due to muscle spasm. She has not seen orthopedics yet. She has not started physical therapy yet.  The splints help some, but currently she is only using the L one, at night.  Her mother has a Hx of CTS and had surgeries, and sister has Sx of CTS. Mom has neck degenerative neck disease.  She has had extensive blood work on 01/31/2014 including RPR negative, TSH  normal, hemoglobin A1c normal at 5.2, normal CRP, negative ANA. B12 was normal as well. Vitamin D was on the lower end of the spectrum at 4634.    Her Past Medical History Is Significant For: Past Medical History:  Diagnosis Date  . Anxiety   . Arthritis   . Asthma   . Depression   . Depression   . Fibromyalgia   . History of chlamydia 09/06/2017  . History of trichomoniasis 09/06/2017   + RNA test, neg wet prep in office POCT  . Hypertension   . Iron deficiency anemia   . Kienbck's disease    LEFT  WRIST  . Left wrist pain    retained deep implant    Her Past Surgical History Is Significant For: Past Surgical History:  Procedure Laterality Date  . APPENDECTOMY    . LAPAROSCOPIC APPENDECTOMY N/A 11/04/2013   Procedure: APPENDECTOMY LAPAROSCOPIC;  Surgeon: Valarie MerinoMatthew B Martin, MD;  Location: [facility];  Service: General;  Laterality: N/A;  . REMOVAL OF IMPLANT Left 06/13/2015   Procedure: LEFT WRIST DEEP IMPLANT REMOVAL ;  Surgeon: Bradly BienenstockFred Ortmann, MD;  [facility];  Service: Orthopedics;  Laterality: Left;  . WRIST RECONSTRUCTION Left 04-15-2015    for Kienbock's disease     Her Family History Is Significant For: Family History  Problem Relation Age of Onset  . Depression Mother   . Diabetes Mother   . Mental illness Mother   . Anxiety disorder Mother   . COPD Father   . Mental illness Father   . Hypertension Father   . Hyperlipidemia Father   . Depression Maternal Grandmother   . Mental illness Maternal Grandmother   . Diabetes Maternal Grandfather   . Mental illness Maternal Grandfather   . Cancer Paternal Grandfather   . Mental illness Paternal Grandfather   . Mental illness Sister   . Mental illness Sister     Her Social History Is Significant For: Social History   Socioeconomic History  . Marital status: Married    Spouse name: [name]  . Number of children: 1  . Years of education: Assoc  . Highest education level: Not on file  Occupational History    Comment: not employed  Tobacco Use  . Smoking status: Former Smoker    Years: 5.00    Quit date: 04/06/1996    Years since quitting: 23.8  . Smokeless tobacco: Never Used  Vaping Use  . Vaping Use: Never used  Substance and Sexual Activity  . Alcohol use: Yes    Alcohol/week: 0.0 standard drinks    Comment: occas.  . Drug use: Yes    Types: Marijuana  . Sexual activity: Not on file    Comment: 2015-- Placement Mirena IUD  Other Topics Concern  . Not on file  Social History Narrative    Patient consumes 4-5 cups of caffeine daily.   Social Determinants of Health   Financial Resource Strain:   . Difficulty of Paying Living Expenses: Not on file  Food Insecurity:   . Worried About Programme researcher, broadcasting/film/videounning Out of Food in the Last Year: Not on file  . Ran Out of Food in the Last Year: Not on file  Transportation Needs:   . Lack of Transportation (Medical): Not on file  . Lack of Transportation (Non-Medical): Not on file  Physical Activity:   . Days of Exercise per Week: Not on file  . Minutes of Exercise per Session: Not on file  Stress:   . Feeling  of Stress : Not on file  Social Connections:   . Frequency of Communication with Friends and Family: Not on file  . Frequency of Social Gatherings with Friends and Family: Not on file  . Attends Religious Services: Not on file  . Active Member of Clubs or Organizations: Not on file  . Attends Banker Meetings: Not on file  . Marital Status: Not on file    Her Allergies Are:  Allergies  Allergen Reactions  . Azithromycin Diarrhea  . Latex Itching  . Other Itching and Other (See Comments)    Peas, Pineapple, Mango, Melons, Cantelope, Watermelons.  Dust mites, trees, grass and pollen.  Causes throat to swell with itching. Tree nuts, causes throat to swell with itching.  . Shellfish Allergy Itching and Swelling  . Voltaren [Diclofenac Sodium] Other (See Comments)    Upset stomach; made pt feel like had to go to bathroom  . Penicillins Rash  :   Her Current Medications Are:  Outpatient Encounter Medications as of 01/18/2020  Medication Sig  . acetaminophen (TYLENOL) 500 MG tablet Take 500 mg by mouth.  Marland Kitchen albuterol (PROAIR HFA) 108 (90 Base) MCG/ACT inhaler Inhale 2 puffs into the lungs as needed for wheezing (2 puffs as needed every 4-6h as needed for cough/wheeze).  . cetirizine (ZYRTEC) 10 MG tablet Take 1 tablet (10 mg total) by mouth at bedtime.  . chlorhexidine (PERIDEX) 0.12 % solution Use as directed 10 mLs in the  mouth or throat 2 (two) times daily.  . clotrimazole-betamethasone (LOTRISONE) cream Apply 1 application topically 2 (two) times daily.  . cyclobenzaprine (AMRIX) 15 MG 24 hr capsule Take 1-2 capsules (15-30 mg total) by mouth daily as needed for muscle spasms.  Marland Kitchen EPINEPHrine (EPIPEN 2-PAK) 0.3 mg/0.3 mL IJ SOAJ injection Inject 0.3 mLs (0.3 mg total) into the skin as needed (systemic reaction to seafood allergy).  . fluticasone (FLONASE) 50 MCG/ACT nasal spray SPRAY 2 SPRAYS INTO EACH NOSTRIL EVERY DAY  . gabapentin (NEURONTIN) 600 MG tablet Take 1 tab po qam and 1 tab po qafternoon and 2 tabs po qhs  . ibuprofen (ADVIL,MOTRIN) 800 MG tablet Take 1 tablet (800 mg total) by mouth every 8 (eight) hours as needed.  . lamoTRIgine (LAMICTAL) 25 MG tablet Take 25 mg by mouth 3 (three) times daily.   . Multiple Vitamins-Minerals (MULTIVITAMIN WITH MINERALS) tablet Take 1 tablet by mouth daily.  . naltrexone (DEPADE) 50 MG tablet Take 50 mg by mouth daily.  Marland Kitchen omeprazole (PRILOSEC) 40 MG capsule omeprazole 40 mg capsule,delayed release  TAKE 1 CAP BY MOUTH 30 MINUTES BEFORE A MEAL  . sertraline (ZOLOFT) 100 MG tablet sertraline 100 mg tablet  . traZODone (DESYREL) 50 MG tablet Take 50 mg by mouth at bedtime.  . [DISCONTINUED] fish oil-omega-3 fatty acids 1000 MG capsule Take 1 g by mouth daily.  . [DISCONTINUED] nystatin (MYCOSTATIN/NYSTOP) powder Apply topically 4 (four) times daily. (Patient not taking: Reported on 04/27/2018)  . [DISCONTINUED] VYVANSE 30 MG capsule daily.    No facility-administered encounter medications on file as of 01/18/2020.  :  Review of Systems:  Out of a complete 14 point review of systems, all are reviewed and negative with the exception of these symptoms as listed below: Review of Systems  Neurological:       Here for sleep consult. No prior sleep study. Reports daily morning h/a are present along with snoring.   Epworth Sleepiness Scale 0= would never doze 1= slight  chance  of dozing 2= moderate chance of dozing 3= high chance of dozing  Sitting and reading:3 Watching TV:3 Sitting inactive in a public place (ex. Theater or meeting):1 As a passenger in a car for an hour without a break:1 Lying down to rest in the afternoon:3 Sitting and talking to someone:1 Sitting quietly after lunch (no alcohol):1 In a car, while stopped in traffic:1 Total:14     Objective:  Neurological Exam  Physical Exam Physical Examination:   Vitals:   01/18/20 0755  BP: 122/70  Pulse: 95  SpO2: 95%    General Examination: The patient is a very pleasant 44 y.o. female in no acute distress. She appears well-developed and well-nourished and well groomed.    HEENT: Normocephalic, atraumatic, pupils are equal, round and reactive to light, extraocular tracking is good without limitation to gaze excursion or nystagmus noted. Hearing is grossly intact. Face is symmetric with normal facial animation. Speech is clear with no dysarthria noted. There is no hypophonia. There is no lip, neck/head, jaw or voice tremor. Neck is supple with full range of passive and active motion. There are no carotid bruits on auscultation. Oropharynx exam reveals: mild mouth dryness, adequate dental hygiene and moderate airway crowding, due to smaller airway entry, tonsils on the smaller side, Mallampati class III.  Tongue protrudes centrally and palate elevates symmetrically.  She has several crowns in the front, primarily on the top, mild overbite.  Chest: Clear to auscultation without wheezing, rhonchi or crackles noted.  Heart: S1+S2+0, regular and normal without murmurs, rubs or gallops noted.   Abdomen: Soft, non-tender and non-distended with normal bowel sounds appreciated on auscultation.  Extremities: There is no pitting edema in the distal lower extremities bilaterally.   Skin: Warm and dry without trophic changes noted.   Musculoskeletal: exam reveals no obvious joint deformities,  tenderness or joint swelling or erythema.   Neurologically:  Mental status: The patient is awake, alert and oriented in all 4 spheres. Her immediate and remote memory, attention, language skills and fund of knowledge are appropriate. There is no evidence of aphasia, agnosia, apraxia or anomia. Speech is clear with normal prosody and enunciation. Thought process is linear. Mood is normal and affect is normal.  Cranial nerves II - XII are as described above under HEENT exam.  Motor exam: Normal bulk, strength and tone is noted. There is no tremor, Romberg is negative. Fine motor skills and coordination: grossly intact.  Cerebellar testing: No dysmetria or intention tremor. There is no truncal or gait ataxia.  Sensory exam: intact to light touch in the upper and lower extremities.  Gait, station and balance: She stands easily. No veering to one side is noted. No leaning to one side is noted. Posture is age-appropriate and stance is narrow based. Gait shows normal stride length and normal pace. No problems turning are noted. Tandem walk is unremarkable.      Assessment and Plan:  In summary, CHARLYNN SALIH is a very pleasant 44 y.o.-year old female with an underlying medical history of hypertension, anxiety, reflux disease, asthma, and severe obesity with a BMI of over 40, and alcohol use disorder (by self-report), whose history and physical exam concerning for obstructive sleep apnea (OSA). I had a long chat with the patient about my findings and the diagnosis of OSA, its prognosis and treatment options. We talked about medical treatments, surgical interventions and non-pharmacological approaches. I explained in particular the risks and ramifications of untreated moderate to severe OSA, especially with respect to  developing cardiovascular disease down the Road, including congestive heart failure, difficult to treat hypertension, cardiac arrhythmias, or stroke. Even type 2 diabetes has, in part, been  linked to untreated OSA. Symptoms of untreated OSA include daytime sleepiness, memory problems, mood irritability and mood disorder such as depression and anxiety, lack of energy, as well as recurrent headaches, especially morning headaches. We talked about trying to maintain a healthy lifestyle in general, as well as the importance of weight control. We also talked about the importance of good sleep hygiene. I recommended the following at this time: sleep study.   I explained the sleep test procedure to the patient and also outlined possible surgical and non-surgical treatment options of OSA, including the use of a custom-made dental device (which would require a referral to a specialist dentist or oral surgeon), upper airway surgical options, such as traditional UPPP or a novel less invasive surgical option in the form of Inspire hypoglossal nerve stimulation (which would involve a referral to an ENT surgeon). I also explained the CPAP treatment option to the patient, who indicated that she would be willing to try CPAP if the need arises. I explained the importance of being compliant with PAP treatment, not only for insurance purposes but primarily to improve Her symptoms, and for the patient's long term health benefit, including to reduce Her cardiovascular risks. I answered all her questions today and the patient was in agreement. I plan to see her back after the sleep study is completed and encouraged her to call with any interim questions, concerns, problems or updates.   Thank you very much for allowing me to participate in the care of this nice patient. If I can be of any further assistance to you please do not hesitate to call me at [phone].  Sincerely,   Huston Foley, MD, PhD

## 2020-01-22 DIAGNOSIS — N879 Dysplasia of cervix uteri, unspecified: Secondary | ICD-10-CM | POA: Insufficient documentation

## 2020-01-25 ENCOUNTER — Telehealth: Payer: Self-pay

## 2020-01-25 NOTE — Telephone Encounter (Signed)
LVM for pt to call me back to schedule sleep study  

## 2020-02-01 ENCOUNTER — Telehealth: Payer: Self-pay

## 2020-02-01 NOTE — Telephone Encounter (Signed)
We have attempted to call the patient 2 times to schedule sleep study. Patient has been unavailable at the phone numbers we have on file and has not returned our calls. If patient calls back we will schedule them for their sleep study. ° °

## 2020-02-25 ENCOUNTER — Ambulatory Visit (INDEPENDENT_AMBULATORY_CARE_PROVIDER_SITE_OTHER): Payer: BC Managed Care – PPO | Admitting: Neurology

## 2020-02-25 DIAGNOSIS — R0681 Apnea, not elsewhere classified: Secondary | ICD-10-CM

## 2020-02-25 DIAGNOSIS — G4719 Other hypersomnia: Secondary | ICD-10-CM

## 2020-02-25 DIAGNOSIS — R519 Headache, unspecified: Secondary | ICD-10-CM

## 2020-02-25 DIAGNOSIS — G4733 Obstructive sleep apnea (adult) (pediatric): Secondary | ICD-10-CM

## 2020-02-25 DIAGNOSIS — G4734 Idiopathic sleep related nonobstructive alveolar hypoventilation: Secondary | ICD-10-CM

## 2020-02-25 DIAGNOSIS — R0683 Snoring: Secondary | ICD-10-CM

## 2020-03-07 ENCOUNTER — Telehealth: Payer: Self-pay | Admitting: Neurology

## 2020-03-07 NOTE — Addendum Note (Signed)
Addended by: Huston Foley on: 03/07/2020 01:27 PM   Modules accepted: Orders

## 2020-03-07 NOTE — Telephone Encounter (Signed)
-----   Message from Huston Foley, MD sent at 03/07/2020  1:27 PM EST ----- Patient referred by Dr. Clelia Croft, seen by me on 01/18/20, diagnostic PSG on 02/25/20.   Please call and notify the patient that the recent sleep study showed severe obstructive sleep apnea. She had low oxygen saturations throughout the night and may need further work up for this.  She should avoid sleeping on her back and aggressively work on weight loss, avoid sedation medications as much as possible and if feasible.  I recommend treatment in the form of CPAP for her severe sleep apnea. This will require a repeat sleep study for proper titration and mask fitting and correct monitoring of the oxygen saturations. Please explain to patient. I have placed an order in the chart. Thanks.  Huston Foley, MD, PhD [facility])

## 2020-03-07 NOTE — Progress Notes (Signed)
Patient referred by Dr. Clelia Croft, seen by me on 01/18/20, diagnostic PSG on 02/25/20.   Please call and notify the patient that the recent sleep study showed severe obstructive sleep apnea. She had low oxygen saturations throughout the night and may need further work up for this.  She should avoid sleeping on her back and aggressively work on weight loss, avoid sedation medications as much as possible and if feasible.  I recommend treatment in the form of CPAP for her severe sleep apnea. This will require a repeat sleep study for proper titration and mask fitting and correct monitoring of the oxygen saturations. Please explain to patient. I have placed an order in the chart. Thanks.  Huston Foley, MD, PhD [facility])

## 2020-03-07 NOTE — Telephone Encounter (Signed)
I called pt. I advised pt that Dr. Frances Furbish reviewed their sleep study results and found that has severe sleep apnea and hypoxemia and recommends that pt be treated with a cpap. Dr. Frances Furbish recommends that pt return for a repeat sleep study in order to properly titrate the cpap and ensure a good mask fit. Pt is agreeable to returning for a titration study. I advised pt that our sleep lab will file with pt's insurance and call pt to schedule the sleep study when we hear back from the pt's insurance regarding coverage of this sleep study. Pt verbalized understanding of results. Pt had no questions at this time but was encouraged to call back if questions arise.

## 2020-03-07 NOTE — Procedures (Signed)
PATIENT'S NAME:  Tonya Perry, Tonya Perry DOB:      1975-08-23      MR#:    [id number]     DATE OF RECORDING: 02/25/2020 REFERRING M.D.:  [name], MD Study Performed:   Baseline Polysomnogram HISTORY: 44 year old woman with a history of hypertension, anxiety, reflux disease, asthma, and severe obesity with a BMI of over 40, who reports snoring and excessive daytime somnolence as well as witnessed apneas per daughter's observation. The patient endorsed the Epworth Sleepiness Scale at 14/24 points. The patient's weight 237 pounds with a height of 64 (inches), resulting in a BMI of 40.6 kg/m2.   CURRENT MEDICATIONS: Tylenol, Proair, Zyrtec, Peridex, Lotrisone, Amrix, Flonase, Neurontin, Advil, Lamictal, Multivitamins, Depade, Prilosec, Zoloft, Desyrel.   PROCEDURE:  This is a multichannel digital polysomnogram utilizing the Somnostar 11.2 system.  Electrodes and sensors were applied and monitored per AASM Specifications.   EEG, EOG, Chin and Limb EMG, were sampled at 200 Hz.  ECG, Snore and Nasal Pressure, Thermal Airflow, Respiratory Effort, CPAP Flow and Pressure, Oximetry was sampled at 50 Hz. Digital video and audio were recorded.      BASELINE STUDY  Lights Out was at 21:47 and Lights On at 04:59.  Total recording time (TRT) was 432.5 minutes, with a total sleep time (TST) of 385.5 minutes.   The patient's sleep latency was 25 minutes. REM latency was 105 minutes, which is high normal. The sleep efficiency was 89.1 %.     SLEEP ARCHITECTURE: WASO (Wake after sleep onset) was 21.5 minutes.  There were 1.5 minutes in Stage N1, 193 minutes Stage N2, 100.5 minutes Stage N3 and 90.5 minutes in Stage REM.  The percentage of Stage N1 was .4%, Stage N2 was 50.1%, which is normal, Stage N3 was 26.1%, which is mildly increased, and Stage R (REM sleep) was 23.5%, which is normal. The arousals were noted as: 18 were spontaneous, 0 were associated with PLMs, 39 were associated with respiratory  events.  RESPIRATORY ANALYSIS:  There were a total of 265 respiratory events:  15 obstructive apneas, 10 central apneas and 0 mixed apneas with a total of 25 apneas and an apnea index (AI) of 3.9 /hour. There were 240 hypopneas with a hypopnea index of 37.4 /hour. The patient also had 0 respiratory event related arousals (RERAs).      The total APNEA/HYPOPNEA INDEX (AHI) was 41.2/hour and the total RESPIRATORY DISTURBANCE INDEX was  41.2 /hour.  144 events occurred in REM sleep and 230 events in NREM. The REM AHI was  95.5 /hour, versus a non-REM AHI of 24.6. The patient spent 0 minutes of total sleep time in the supine position and 386 minutes in non-supine.. The supine AHI was n/a versus a non-supine AHI of 41.3.  OXYGEN SATURATION & C02:  The Wake baseline 02 saturation was 98%, with the lowest being 59%. Time spent below 89% saturation equaled 315 minutes.    PERIODIC LIMB MOVEMENTS: The patient had a total of 0 Periodic Limb Movements.  The Periodic Limb Movement (PLM) index was 0 and the PLM Arousal index was 0/hour.  Audio and video analysis did not show any abnormal or unusual movements, behaviors, phonations or vocalizations. The patient took no bathroom breaks. Moderate to loud snoring was noted. The EKG was in keeping with normal sinus rhythm (NSR).  Post-study, the patient indicated that sleep was worse than usual.   IMPRESSION:  1. Severe Obstructive Sleep Apnea (OSA) 2. Nocturnal hypoxemia  RECOMMENDATIONS:  1. This  study demonstrates severe obstructive sleep apnea, with a total AHI of 41.2/hour, REM AHI of 95.5/hour, and O2 nadir of 59%. The absence of supine sleep may have resulted in an underestimation of her sleep disordered breathing. She was noted to have lower oxygen saturations throughout the night. An underlying cardiopulmonary etiology cannot be excluded and further work up may be necessary. Sedating medications, especially narcotic pain medication should be used with  extreme caution. Weight loss should be pursued.  2. Treatment with positive airway pressure in the form of CPAP is recommended. This will require a full night titration study to optimize therapy. Other treatment options may include avoidance of supine sleep position along with weight loss, upper airway or jaw surgery in selected patients or the use of an oral appliance in certain patients. ENT evaluation and/or consultation with a maxillofacial surgeon or dentist may be feasible in some instances.    3. Please note that untreated obstructive sleep apnea may carry additional perioperative morbidity. Patients with significant obstructive sleep apnea should receive perioperative PAP therapy and the surgeons and particularly the anesthesiologist should be informed of the diagnosis and the severity of the sleep disordered breathing. 4. The patient should be cautioned not to drive, work at heights, or operate dangerous or heavy equipment when tired or sleepy. Review and reiteration of good sleep hygiene measures should be pursued with any patient. 5. The patient will be seen in follow-up in the sleep clinic at [facility] for discussion of the test results, symptom and treatment compliance review, further management strategies, etc. The referring provider will be notified of the test results.  I certify that I have reviewed the entire raw data recording prior to the issuance of this report in accordance with the Standards of Accreditation of the American Academy of Sleep Medicine (AASM)  Huston Foley, MD, PhD Diplomat, American Board of Neurology and Sleep Medicine (Neurology and Sleep Medicine)

## 2020-03-19 ENCOUNTER — Telehealth: Payer: Self-pay

## 2020-03-19 NOTE — Telephone Encounter (Signed)
Called Pt to schedule CPAP titration.

## 2020-04-09 ENCOUNTER — Telehealth: Payer: Self-pay

## 2020-04-09 NOTE — Telephone Encounter (Signed)
Calling patient regarding sleep study. 

## 2020-04-11 DIAGNOSIS — Z79899 Other long term (current) drug therapy: Secondary | ICD-10-CM | POA: Insufficient documentation

## 2020-05-05 ENCOUNTER — Other Ambulatory Visit: Payer: Self-pay

## 2020-05-05 ENCOUNTER — Ambulatory Visit (INDEPENDENT_AMBULATORY_CARE_PROVIDER_SITE_OTHER): Payer: BC Managed Care – PPO | Admitting: Neurology

## 2020-05-05 DIAGNOSIS — R519 Headache, unspecified: Secondary | ICD-10-CM

## 2020-05-05 DIAGNOSIS — G4733 Obstructive sleep apnea (adult) (pediatric): Secondary | ICD-10-CM | POA: Diagnosis not present

## 2020-05-05 DIAGNOSIS — G4734 Idiopathic sleep related nonobstructive alveolar hypoventilation: Secondary | ICD-10-CM

## 2020-05-05 DIAGNOSIS — G4731 Primary central sleep apnea: Secondary | ICD-10-CM

## 2020-05-05 DIAGNOSIS — R0681 Apnea, not elsewhere classified: Secondary | ICD-10-CM

## 2020-05-05 DIAGNOSIS — G4719 Other hypersomnia: Secondary | ICD-10-CM

## 2020-05-05 DIAGNOSIS — Z9989 Dependence on other enabling machines and devices: Secondary | ICD-10-CM

## 2020-05-15 ENCOUNTER — Telehealth: Payer: Self-pay | Admitting: Neurology

## 2020-05-15 ENCOUNTER — Telehealth: Payer: Self-pay

## 2020-05-15 NOTE — Procedures (Signed)
PATIENT'S NAME:  Tonya Perry, Tonya Perry DOB:      01-13-1976      MR#:    [id number]     DATE OF RECORDING: 05/05/2020 REFERRING M.D.:  [name], MD Study Performed:   CPAP  Titration HISTORY: 45 year old woman with a history of hypertension, anxiety, reflux disease, asthma, and severe obesity with a BMI of over 40, who presents for a full night titration study. Her baseline sleep study from 02/25/20 showed severe obstructive sleep apnea, with a total AHI of 41.2/hour, REM AHI of 95.5/hour, and O2 nadir of 59%. The absence of supine sleep may have resulted in an underestimation of her sleep disordered breathing. She was noted to have lower oxygen saturations throughout the night.  The patient's weight 237 pounds with a height of 64 (inches), resulting in a BMI of 40.6 kg/m2.  CURRENT MEDICATIONS: Tylenol, Proair, Zyrtec, Peridex, Lotrisone, Amrix, Flonase, Neurontin, Advil, Lamictal, Multivitamins, Depade, Prilosec, Zoloft, Desyrel.  PROCEDURE:  This is a multichannel digital polysomnogram utilizing the SomnoStar 11.2 system.  Electrodes and sensors were applied and monitored per AASM Specifications.   EEG, EOG, Chin and Limb EMG, were sampled at 200 Hz.  ECG, Snore and Nasal Pressure, Thermal Airflow, Respiratory Effort, CPAP Flow and Pressure, Oximetry was sampled at 50 Hz. Digital video and audio were recorded.      The patient was fitted with a DreamWear full facemask size medium.  She was started on CPAP of 5 cm and gradually titrated in 1 cm increments to a CPAP pressure of 13 cm.  She was noted to have central respiratory events and obstructive respiratory events.  Her AHI on a pressure of 13 was 13.3/h.  Due to higher pressure requirements she was changed to standard BiPAP therapy at 14/10 centimeters and titrated further to 15/11 centimeters.  She had significant increase in her central apneas on BiPAP therapy and a backup rate of 12/min was added, which did not improve her obstructive and  central sleep disordered breathing. Her AHI was 43.4/h on BiPAP ST of 15/10 with a rate of 12.  Therefore, she was switched to ASV with an EPAP of 10 cm, maximum pressure support of 15 cm and minimum pressure support of 3 cm.  The settings provided much smoother breathing and did not have to get further titrated.  AHI was 1.4/h, O2 nadir 90%, but only nonsupine and non-REM sleep was achieved on this setting.  Lights Out was at 21:57 and Lights On at 05:02. Total recording time (TRT) was 425 minutes, with a total sleep time (TST) of 388 minutes. The patient's sleep latency was 7.5 minutes. REM latency was 96.5 minutes.  The sleep efficiency was 91.3 %.    SLEEP ARCHITECTURE: WASO (Wake after sleep onset) was 31.5 minutes.  There were 14.5 minutes in Stage N1, 188 minutes Stage N2, 97 minutes Stage N3 and 88.5 minutes in Stage REM.  The percentage of Stage N1 was 3.7%, Stage N2 was 48.5%, Stage N3 was 25% and Stage R (REM sleep) was 22.8%, which is normal. The arousals were noted as: 25 were spontaneous, 0 were associated with PLMs, 52 were associated with respiratory events.  RESPIRATORY ANALYSIS:  There was a total of 91 respiratory events: 0 obstructive apneas, 66 central apneas and 0 mixed apneas with a total of 66 apneas and an apnea index (AI) of 10.2 /hour. There were 25 hypopneas with a hypopnea index of 3.9/hour. The patient also had 11 respiratory event related arousals (RERAs).  The total APNEA/HYPOPNEA INDEX  (AHI) was 14.1 /hour and the total RESPIRATORY DISTURBANCE INDEX was 15.8 /hour  27 events occurred in REM sleep and 64 events in NREM. The REM AHI was 18.3 /hour versus a non-REM AHI of 12.8 /hour.  The patient spent 0 minutes of total sleep time in the supine position and 388 minutes in non-supine. The supine AHI was 0.0, versus a non-supine AHI of 14.1.  OXYGEN SATURATION & C02:  The baseline 02 saturation was 96%, with the lowest being 85%. Time spent below 89% saturation equaled 2  minutes.  PERIODIC LIMB MOVEMENTS:  The patient had a total of 0 Periodic Limb Movements. The Periodic Limb Movement (PLM) index was 0 and the PLM Arousal index was 0 /hour.  Audio and video analysis did not show any abnormal or unusual movements, behaviors, phonations or vocalizations. The patient took no bathroom breaks. The EKG was in keeping with normal sinus rhythm (NSR). Post-study, the patient indicated that sleep was better than usual.   IMPRESSION: 1. Severe Obstructive Sleep Apnea (OSA) 2. Insufficient treatment with CPAP 3. Central sleep apnea   RECOMMENDATIONS:   1. This patient's severe sleep apnea did not respond adequately to CPAP and had worsening of her central apneas on standard BiPAP and BiPAP ST. She had significant improvement of her obstructive and central sleep disordered breathing with ASV. I will, therefore, start the patient on home ASV with an EPAP of 10 cm, max PS of 15 cm, min PS of 3 cm, via medium Dreamwear FFM. The patient will be advised to be fully compliant with PAP therapy to improve sleep related symptoms and decrease long term cardiovascular risks. The patient should be reminded, that it may take up to 3 months to get fully used to using PAP with all planned sleep. The earlier full compliance is achieved, the better long term compliance tends to be. Please note that untreated obstructive sleep apnea may carry additional perioperative morbidity. Patients with significant obstructive sleep apnea should receive perioperative PAP therapy and the surgeons and particularly the anesthesiologist should be informed of the diagnosis and the severity of the sleep disordered breathing. 2. The patient should be cautioned not to drive, work at heights, or operate dangerous or heavy equipment when tired or sleepy. Review and reiteration of good sleep hygiene measures should be pursued with any patient. 3. The patient will be seen in follow-up in the sleep clinic at [facility] for  discussion of the test results, symptom and treatment compliance review, further management strategies, etc. The referring provider will be notified of the test results.   I certify that I have reviewed the entire raw data recording prior to the issuance of this report in accordance with the Standards of Accreditation of the American Academy of Sleep Medicine (AASM)   Huston Foley, MD, PhD Diplomat, American Board of Neurology and Sleep Medicine (Neurology and Sleep Medicine)a

## 2020-05-15 NOTE — Telephone Encounter (Signed)
See result note, thanks.

## 2020-05-15 NOTE — Progress Notes (Signed)
Patient referred by Dr. Clelia Croft, seen by me on 01/18/20, diagnostic PSG on 02/25/20.  Patient had a CPAP titration study on 05/05/20.  Please call and inform patient that I have entered an order for treatment with positive airway pressure (PAP) treatment for obstructive sleep apnea (OSA). She did well during the latest sleep study with a machine called ASV.  We tried a standard CPAP machine first and then a machine called BiPAP but she did not respond well to these treatments.  She did have a good response to what is called an ASV which is a more sophisticated machine.  I will request a home ASV machine through a DME company.  We will see her back in follow-up after approximately 60 to 89 days of treatment, we have to follow insurance requirement for compliance as well.  Please arrange for follow-up and talk to patient, explain the need for compliance also to her.  Please process order and send it to the DME company of her choosing or as per insurance requirement.    Huston Foley, MD, PhD [facility])

## 2020-05-15 NOTE — Telephone Encounter (Signed)
I called pt. No answer, left a message asking pt to call me back. I advised during the vm that I could release the results via my chart if she likes. I asked that she send a message letting me know ok to send via my chart.

## 2020-05-15 NOTE — Addendum Note (Signed)
Addended by: Huston Foley on: 05/15/2020 04:10 PM   Modules accepted: Orders

## 2020-05-15 NOTE — Telephone Encounter (Signed)
Pt called, had a sleep study on 1/30 at 8 pm. I have not heard from anyone with my results. Would like a call from the nurse.

## 2020-05-15 NOTE — Telephone Encounter (Signed)
-----   Message from Huston Foley, MD sent at 05/15/2020  4:10 PM EST ----- Patient referred by Dr. Clelia Croft, seen by me on 01/18/20, diagnostic PSG on 02/25/20.  Patient had a CPAP titration study on 05/05/20.  Please call and inform patient that I have entered an order for treatment with positive airway pressure (PAP) treatment for obstructive sleep apnea (OSA). She did well during the latest sleep study with a machine called ASV.  We tried a standard CPAP machine first and then a machine called BiPAP but she did not respond well to these treatments.  She did have a good response to what is called an ASV which is a more sophisticated machine.  I will request a home ASV machine through a DME company.  We will see her back in follow-up after approximately 60 to 89 days of treatment, we have to follow insurance requirement for compliance as well.  Please arrange for follow-up and talk to patient, explain the need for compliance also to her.  Please process order and send it to the DME company of her choosing or as per insurance requirement.    Huston Foley, MD, PhD [facility])

## 2020-05-16 NOTE — Telephone Encounter (Signed)
I called pt. I advised pt that Dr. Frances Furbish reviewed their sleep study results and found that pt did well during titration study on ASV. Dr. Frances Furbish recommends that pt start ASV at home for treatment. I reviewed PAP compliance expectations with the pt. Pt is agreeable to starting an ASV. I advised pt that an order will be sent to a DME, [facility], and [facility] will call the pt within about one week after they file with the pt's insurance. [facility] will show the pt how to use the machine, fit for masks, and troubleshoot the ASV if needed. A follow up appt was made for insurance purposes with Dr. Frances Furbish on 08/13/20 200pm. Pt verbalized understanding to arrive 15 minutes early and bring their ASV. A letter with all of this information in it will be mailed to the pt as a reminder. I verified with the pt that the address we have on file is correct. Pt verbalized understanding of results. Pt had no questions at this time but was encouraged to call back if questions arise. I have sent the order to [employer] and have received confirmation that they have received the order.

## 2020-08-11 ENCOUNTER — Encounter: Payer: Self-pay | Admitting: Neurology

## 2020-08-13 ENCOUNTER — Ambulatory Visit (INDEPENDENT_AMBULATORY_CARE_PROVIDER_SITE_OTHER): Payer: BC Managed Care – PPO | Admitting: Neurology

## 2020-08-13 ENCOUNTER — Encounter: Payer: Self-pay | Admitting: Neurology

## 2020-08-13 VITALS — BP 116/78 | HR 67 | Ht 63.0 in | Wt 254.0 lb

## 2020-08-13 DIAGNOSIS — G4733 Obstructive sleep apnea (adult) (pediatric): Secondary | ICD-10-CM | POA: Diagnosis not present

## 2020-08-13 DIAGNOSIS — Z9989 Dependence on other enabling machines and devices: Secondary | ICD-10-CM | POA: Diagnosis not present

## 2020-08-13 DIAGNOSIS — Z789 Other specified health status: Secondary | ICD-10-CM | POA: Diagnosis not present

## 2020-08-13 DIAGNOSIS — G4731 Primary central sleep apnea: Secondary | ICD-10-CM

## 2020-08-13 NOTE — Progress Notes (Signed)
Subjective:    Patient ID: Tonya Perry is a 45 y.o. female.  HPI     Interim history:   Tonya Perry is a 45 year old right-handed woman with an underlying medical history of hypertension, anxiety, reflux disease, asthma, and severe obesity with a BMI of over 40, and alcohol use disorder (by self-report), who presents for follow-up consultation of her obstructive sleep apnea after interim testing and starting ASV. The patient is unaccompanied today.  I saw her on 01/18/2020 at the request of her primary care physician, at which time she reported witnessed apneas and snoring as well as daytime somnolence.  She was advised to proceed with a sleep study.  She had a subsequent baseline sleep study followed by a titration study.  Her baseline sleep study from 02/25/2020 showed a sleep efficiency of 89.1%, sleep latency 25 minutes, REM latency 105 minutes.  She had a normal percentage of REM sleep at 23.5%.  He had severe obstructive sleep apnea with an AHI of 41.2/h, REM AHI was 95.5/h, she did not achieve any supine sleep.  Average oxygen saturation was 98%, nadir was 59% with time below 89% saturation of 315 minutes. She was advised to return for a full night titration study which she had on 05/05/2020.  Sleep efficiency was 91.3%, sleep latency 7.5 minutes, REM latency 96.5 minutes.  She was fitted with a DreamWear fullface mask, size medium, she was started on CPAP of 5 cm and gradually increase to a CPAP pressure of 13 cm.  She is started having central events and AHI on a pressure of 13 cm was 13.3/h.  She was therefore changed to a standard BiPAP pressure of 14/10 centimeters and titrated further to 15/11 centimeters.  She had ongoing central apneas on standard BiPAP therapy and even BiPAP ST with a rate of 12/min did not improve her obstructive and central sleep apnea.  Her AHI was 43.4/h on a BiPAP ST of 15/10 centimeters with a rate of 12/min.  She was finally switched to ASV with an EPAP of 10  cm, maximum pressure support of 15 and minimum pressure support of 3 cm, her sleep disordered breathing improved significantly with a final AHI of 1.4/h, O2 nadir 90% with nonsupine and non-REM sleep achieved on the ASV setting.  Based on her test results I prescribed ASV for home use.  Her set up date was 05/23/2020.  Today, 08/13/20: I reviewed her ASV Compliance data from 07/13/2020 through 08/11/2020, which is a total of 30 days, during which time she used her machine every night with percent use days greater than 4 hours at 97%, indicating excellent compliance with an average usage of 6 hours and 52 minutes, residual AHI at goal at 1.4/h, leak on the higher side with a 95th percentile at 19.2 L/min on EPAP of 10 cm, minimum pressure support of 3 cm, maximum pressure support of 15 cm.  She reports doing fairly well, still adjusting to some degree to the ASV.  She does sleep on her side.  She is using a full facemask and sometimes feels like she has to tighten the mask but then it feels uncomfortable.  She has benefited from treatment and that her sleep quality and daytime energy are somewhat better.  She is motivated to continue with treatment.  She is working on weight loss.  The patient's allergies, current medications, family history, past medical history, past social history, past surgical history and problem list were reviewed and updated as appropriate.  Previously:   01/18/20: (She) reports snoring and excessive daytime somnolence as well as witnessed apneas per daughter's observation.  I reviewed your office note from 12/08/2019. Her Epworth sleepiness score is 14 out of 24, fatigue severity score is 57 out of 63.  Of note, she is on multiple medications including potentially sedating medications.  She is currently on gabapentin 400 mg 3 times daily, Lamictal 25 mg 3 times daily, sertraline, trazodone at bedtime long-acting cyclobenzaprine 15 to 30 mg daily.  She sees Tamela Perry for mental health.  She  is on the naltrexone for alcohol use.  She drinks approximately 6-8 beers at a time but not daily.  She lives with her 79 year old daughter, she is divorced.  They have 1 small dog in the household, 2 ferrets and 1 total.  The patient is on disability.  She goes to bed typically around 930 or 10 PM and rise time to get her daughter ready for school is 6:45 AM.  The patient denies any night to night nocturia.  She does have morning headaches fairly frequently and takes ibuprofen or Tylenol as needed.  She has gained weight in the realm of 40 to 50 pounds, primarily during this pandemic.  She drinks caffeine in the form of coffee, 2 cups in the morning and unsweetened tea, several cups during the day.  She is a occasional smoker of marijuana and quit smoking cigarettes when she was 22.  She is not aware of any family history of obstructive sleep apnea.  She has started taking a nap in the afternoon in her recliner which is unusual for her.    I had seen her nearly 6 years ago for paresthesias.  Work-up at the time included blood work and EMG testing which were benign. Previously:    02/13/14: 45 year old right-handed woman with an underlying medical history of allergies, depression, anxiety, asthma, arthritis and history of substance abuse (long time ago, EtOH, and marijuana), who reports a one-year history of tingling and numbness in both hands, primarily in the fingers, L worse than R. She also has noted tingling and numbness in both feet in the last 3 or 4 months, mostly affecting the toes, b/l. She saw you in October and you suggested wrist splints to see if her symptoms improve in her hands. She was referred to orthopedics and physical therapy.  Neck x-ray on 02/05/2014 showed mild reversal of lordotic curvature. This finding is most likely due to muscle spasm. She has not seen orthopedics yet. She has not started physical therapy yet.  The splints help some, but currently she is only using the L one, at  night.  Her mother has a Hx of CTS and had surgeries, and sister has Sx of CTS. Mom has neck degenerative neck disease.  She has had extensive blood work on 01/31/2014 including RPR negative, TSH normal, hemoglobin A1c normal at 5.2, normal CRP, negative ANA. B12 was normal as well. Vitamin D was on the lower end of the spectrum at 15.   His Past Medical History Is Significant For: Past Medical History:  Diagnosis Date  . Anxiety   . Arthritis   . Asthma   . Depression   . Depression   . Fibromyalgia   . History of chlamydia 09/06/2017  . History of trichomoniasis 09/06/2017   + RNA test, neg wet prep in office POCT  . Hypertension   . Iron deficiency anemia   . Kienbck's disease    LEFT WRIST  .  Left wrist pain    retained deep implant    His Past Surgical History Is Significant For: Past Surgical History:  Procedure Laterality Date  . APPENDECTOMY    . LAPAROSCOPIC APPENDECTOMY N/A 11/04/2013   Procedure: APPENDECTOMY LAPAROSCOPIC;  Surgeon: Valarie Merino, MD;  Location: [facility];  Service: General;  Laterality: N/A;  . REMOVAL OF IMPLANT Left 06/13/2015   Procedure: LEFT WRIST DEEP IMPLANT REMOVAL ;  Surgeon: Bradly Bienenstock, MD;  Location: [facility];  Service: Orthopedics;  Laterality: Left;  . WRIST RECONSTRUCTION Left 04-15-2015    for Kienbock's disease     His Family History Is Significant For: Family History  Problem Relation Age of Onset  . Depression Mother   . Diabetes Mother   . Mental illness Mother   . Anxiety disorder Mother   . COPD Father   . Mental illness Father   . Hypertension Father   . Hyperlipidemia Father   . Depression Maternal Grandmother   . Mental illness Maternal Grandmother   . Diabetes Maternal Grandfather   . Mental illness Maternal Grandfather   . Cancer Paternal Grandfather   . Mental illness Paternal Grandfather   . Mental illness Sister   . Mental illness Sister     His Social History Is Significant  For: Social History   Socioeconomic History  . Marital status: Married    Spouse name: [name]  . Number of children: 1  . Years of education: Assoc  . Highest education level: Not on file  Occupational History    Comment: not employed  Tobacco Use  . Smoking status: Former Smoker    Years: 5.00    Quit date: 04/06/1996    Years since quitting: 24.3  . Smokeless tobacco: Never Used  Vaping Use  . Vaping Use: Never used  Substance and Sexual Activity  . Alcohol use: Yes    Alcohol/week: 0.0 standard drinks    Comment: occas.  . Drug use: Yes    Types: Marijuana  . Sexual activity: Not on file    Comment: 2015-- Placement Mirena IUD  Other Topics Concern  . Not on file  Social History Narrative   Patient consumes 4-5 cups of caffeine daily.   Social Determinants of Health   Financial Resource Strain: Not on file  Food Insecurity: Not on file  Transportation Needs: Not on file  Physical Activity: Not on file  Stress: Not on file  Social Connections: Not on file    His Allergies Are:  Allergies  Allergen Reactions  . Azithromycin Diarrhea  . Latex Itching  . Other Itching and Other (See Comments)    Peas, Pineapple, Mango, Melons, Cantelope, Watermelons.  Dust mites, trees, grass and pollen.  Causes throat to swell with itching. Tree nuts, causes throat to swell with itching.  . Shellfish Allergy Itching and Swelling  . Voltaren [Diclofenac Sodium] Other (See Comments)    Upset stomach; made pt feel like had to go to bathroom  . Penicillins Rash  :   His Current Medications Are:  Outpatient Encounter Medications as of 08/13/2020  Medication Sig  . acetaminophen (TYLENOL) 500 MG tablet Take 500 mg by mouth.  Marland Kitchen albuterol (PROAIR HFA) 108 (90 Base) MCG/ACT inhaler Inhale 2 puffs into the lungs as needed for wheezing (2 puffs as needed every 4-6h as needed for cough/wheeze).  . cetirizine (ZYRTEC) 10 MG tablet Take 1 tablet (10 mg total) by mouth at bedtime.  .  clotrimazole-betamethasone (LOTRISONE) cream Apply  1 application topically 2 (two) times daily.  Marland Kitchen EPINEPHrine (EPIPEN 2-PAK) 0.3 mg/0.3 mL IJ SOAJ injection Inject 0.3 mLs (0.3 mg total) into the skin as needed (systemic reaction to seafood allergy).  . fluticasone (FLONASE) 50 MCG/ACT nasal spray SPRAY 2 SPRAYS INTO EACH NOSTRIL EVERY DAY  . gabapentin (NEURONTIN) 600 MG tablet Take 1 tab po qam and 1 tab po qafternoon and 2 tabs po qhs (Patient taking differently: 400 mg 3 (three) times daily. Take 1 tab po qam and 1 tab po qafternoon and 2 tabs po qhs)  . ibuprofen (ADVIL,MOTRIN) 800 MG tablet Take 1 tablet (800 mg total) by mouth every 8 (eight) hours as needed.  . lamoTRIgine (LAMICTAL) 25 MG tablet Take 25 mg by mouth 4 (four) times daily.  . Multiple Vitamins-Minerals (MULTIVITAMIN WITH MINERALS) tablet Take 1 tablet by mouth daily.  . naltrexone (DEPADE) 50 MG tablet Take 50 mg by mouth daily.  Marland Kitchen omeprazole (PRILOSEC) 40 MG capsule omeprazole 40 mg capsule,delayed release  TAKE 1 CAP BY MOUTH 30 MINUTES BEFORE A MEAL  . sertraline (ZOLOFT) 100 MG tablet sertraline 100 mg tablet  . traZODone (DESYREL) 50 MG tablet Take 50 mg by mouth at bedtime.  . [DISCONTINUED] chlorhexidine (PERIDEX) 0.12 % solution Use as directed 10 mLs in the mouth or throat 2 (two) times daily.  . [DISCONTINUED] cyclobenzaprine (AMRIX) 15 MG 24 hr capsule Take 1-2 capsules (15-30 mg total) by mouth daily as needed for muscle spasms.   No facility-administered encounter medications on file as of 08/13/2020.  :  Review of Systems:  Out of a complete 14 point review of systems, all are reviewed and negative with the exception of these symptoms as listed below: Review of Systems  Neurological:       Here for f/u on starting ASV, pt reports she has been doing ok. Still adjusting to using the machine every night. Reports she does feel better when she uses the machine.     Objective:  Neurological Exam  Physical  Exam Physical Examination:   Vitals:   08/13/20 1406  BP: 116/78  Pulse: 67    General Examination: The patient is a very pleasant 45 y.o. female in no acute distress. He appears well-developed and well-nourished and well groomed.   HEENT: Normocephalic, atraumatic, pupils are equal, round and reactive to light, extraocular tracking is good without limitation to gaze excursion or nystagmus noted. Hearing is grossly intact. Face is symmetric with normal facial animation. Speech is clear with no dysarthria noted. There is no hypophonia. There is no lip, neck/head, jaw or voice tremor. Neck is supple with full range of passive and active motion. There are no carotid bruits on auscultation. Oropharynx exam reveals: mild mouth dryness, adequate dental hygiene and moderate airway crowding.  Tongue protrudes centrally and palate elevates symmetrically.   Chest: Clear to auscultation without wheezing, rhonchi or crackles noted.  Heart: S1+S2+0, regular and normal without murmurs, rubs or gallops noted.   Abdomen: Soft, non-tender and non-distended  Extremities: There is no pitting edema in the distal lower extremities bilaterally.   Skin: Warm and dry without trophic changes noted.   Musculoskeletal: exam reveals no obvious joint deformities, tenderness or joint swelling or erythema.   Neurologically:  Mental status: The patient is awake, alert and oriented in all 4 spheres. Her immediate and remote memory, attention, language skills and fund of knowledge are appropriate. There is no evidence of aphasia, agnosia, apraxia or anomia. Speech is clear with normal  prosody and enunciation. Thought process is linear. Mood is normal and affect is normal.  Cranial nerves II - XII are as described above under HEENT exam.  Motor exam: Normal bulk, strength and tone is noted. There is no tremor, fine motor skills and coordination: grossly intact.  Cerebellar testing: No dysmetria or intention tremor.  There is no truncal or gait ataxia.  Sensory exam: intact to light touch in the upper and lower extremities.  Gait, station and balance: She stands easily. No veering to one side is noted. No leaning to one side is noted. Posture is age-appropriate and stance is narrow based. Gait shows normal stride length and normal pace. No problems turning are noted.   Assessment and Plan:  In summary, Tonya Perry is a very pleasant 45 year old female with an underlying medical history of hypertension, anxiety, reflux disease, asthma, and severe obesity with a BMI of over 40, and alcohol use disorder (by self-report), who presents for follow-up consultation of her obstructive sleep apnea, which was found to be in the severe range, baseline sleep study results from 02/25/2020 showed a total AHI of 41.2/h, O2 nadir 59%.  She had a successful titration study on 05/05/2020, had treatment emergent central apneas which were not alleviated by standard BiPAP or BiPAP ST.  She had good results with ASV.  She is compliant with treatment and started treatment on 05/23/2020.  She has noted benefit regarding her daytime energy as well as sleep quality but is still adjusting to treatment.  She is commended for her treatment adherence.  She is motivated to continue with treatment.  She is advised to seek a mask refit appointment with her DME company.  I will make a request as well.  She is encouraged to follow-up routinely to see one of our nurse practitioners in 6 months and then hopefully yearly thereafter so long as she does well.  We reviewed her sleep study results and her compliance data in detail today.  I answered all her questions today and she was in agreement.  I spent 32 minutes in total face-to-face time and in reviewing records during pre-charting, more than 50% of which was spent in counseling and coordination of care, reviewing test results, reviewing medications and treatment regimen and/or in discussing or  reviewing the diagnosis of OSA, the prognosis and treatment options. Pertinent laboratory and imaging test results that were available during this visit with the patient were reviewed by me and considered in my medical decision making (see chart for details).

## 2020-08-13 NOTE — Patient Instructions (Addendum)
It was good to see you again today.  I am glad to hear that you have adjusted fairly well to using your ASV.  You are compliant with it, and your apnea scores look good. I will request a mask refit appointment with your DME company so they can talk to you about a better mask fit or make sure that you are not tightening the mask too much, this can cause discomfort and difficulty with tolerance. Please continue using your ASV regularly. While your insurance requires that you use the machine at least 4 hours each night on 70% of the nights, I recommend, that you not skip any nights and use it throughout the night if you can. Getting used to using a PAP (positive airway pressure) machine and staying with the treatment long term does take time and patience and discipline. Untreated obstructive sleep apnea when it is moderate to severe can have an adverse impact on cardiovascular health and raise her risk for heart disease, arrhythmias, hypertension, congestive heart failure, stroke and diabetes. Untreated obstructive sleep apnea causes sleep disruption, nonrestorative sleep, and sleep deprivation. This can have an impact on your day to day functioning and cause daytime sleepiness and impairment of cognitive function, memory loss, mood disturbance, and problems focussing. Using a PAP machine regularly can improve these symptoms.  I am glad to hear that you have benefited from ASV treatment.  Please continue to be fully compliant and continue to work on weight loss.  We can see you 6 months, you can see one of our nurse practitioners as you are doing well, if you continue to do well, we will see you yearly after that.

## 2021-02-11 ENCOUNTER — Ambulatory Visit: Payer: Medicare Other | Admitting: Family Medicine

## 2021-02-12 ENCOUNTER — Ambulatory Visit: Payer: Medicare Other | Admitting: Family Medicine

## 2021-02-12 ENCOUNTER — Telehealth: Payer: Self-pay | Admitting: Family Medicine

## 2021-02-12 NOTE — Telephone Encounter (Signed)
NP out- LVM & sent mychart message informing pt of cancellation.

## 2021-03-20 ENCOUNTER — Encounter: Payer: Self-pay | Admitting: Adult Health

## 2021-03-20 ENCOUNTER — Ambulatory Visit (INDEPENDENT_AMBULATORY_CARE_PROVIDER_SITE_OTHER): Payer: BC Managed Care – PPO | Admitting: Adult Health

## 2021-03-20 VITALS — Ht 63.0 in | Wt 249.0 lb

## 2021-03-20 DIAGNOSIS — G4733 Obstructive sleep apnea (adult) (pediatric): Secondary | ICD-10-CM

## 2021-03-20 NOTE — Patient Instructions (Signed)
Continue using CPAP nightly and greater than 4 hours each night °If your symptoms worsen or you develop new symptoms please let us know.  ° °

## 2021-03-20 NOTE — Progress Notes (Addendum)
PATIENT: Tonya Perry DOB: [birth year]  REASON FOR VISIT: follow up HISTORY FROM: patient PRIMARY NEUROLOGIST: Dr. Frances Furbish  HISTORY OF PRESENT ILLNESS: Today 03/20/21:  Tonya Perry is a 45 year old female with a history of obstructive sleep apnea on BiPAP.  She states that she currently has a fullface mask.  She needs to have the DreamWear full face and liked it better.  She states current mask leaks a lot.      REVIEW OF SYSTEMS: Out of a complete 14 system review of symptoms, the patient complains only of the following symptoms, and all other reviewed systems are negative.  FSS 51 ESS7  ALLERGIES: Allergies  Allergen Reactions   Azithromycin Diarrhea   Latex Itching   Other Itching and Other (See Comments)    Peas, Pineapple, Mango, Melons, Cantelope, Watermelons.  Dust mites, trees, grass and pollen.  Causes throat to swell with itching. Tree nuts, causes throat to swell with itching.   Shellfish Allergy Itching and Swelling   Voltaren [Diclofenac Sodium] Other (See Comments)    Upset stomach; made pt feel like had to go to bathroom   Penicillins Rash    HOME MEDICATIONS: Outpatient Medications Prior to Visit  Medication Sig Dispense Refill   acetaminophen (TYLENOL) 500 MG tablet Take 500 mg by mouth.     albuterol (PROAIR HFA) 108 (90 Base) MCG/ACT inhaler Inhale 2 puffs into the lungs as needed for wheezing (2 puffs as needed every 4-6h as needed for cough/wheeze). 1 Inhaler 5   cetirizine (ZYRTEC) 10 MG tablet Take 1 tablet (10 mg total) by mouth at bedtime. 90 tablet 3   clotrimazole-betamethasone (LOTRISONE) cream Apply 1 application topically 2 (two) times daily. 45 g 1   fluticasone (FLONASE) 50 MCG/ACT nasal spray SPRAY 2 SPRAYS INTO EACH NOSTRIL EVERY DAY 48 mL 0   gabapentin (NEURONTIN) 600 MG tablet Take 1 tab po qam and 1 tab po qafternoon and 2 tabs po qhs (Patient taking differently: 400 mg 3 (three) times daily. Take 1 tab po qam and 1 tab po  qafternoon and 2 tabs po qhs) 360 tablet 1   ibuprofen (ADVIL,MOTRIN) 800 MG tablet Take 1 tablet (800 mg total) by mouth every 8 (eight) hours as needed. 20 tablet 0   lamoTRIgine (LAMICTAL) 25 MG tablet Take 25 mg by mouth 4 (four) times daily.     Multiple Vitamins-Minerals (MULTIVITAMIN WITH MINERALS) tablet Take 1 tablet by mouth daily.     naltrexone (DEPADE) 50 MG tablet Take 50 mg by mouth daily.     omeprazole (PRILOSEC) 40 MG capsule omeprazole 40 mg capsule,delayed release  TAKE 1 CAP BY MOUTH 30 MINUTES BEFORE A MEAL     sertraline (ZOLOFT) 100 MG tablet sertraline 100 mg tablet     traZODone (DESYREL) 50 MG tablet Take 50 mg by mouth at bedtime.     EPINEPHrine (EPIPEN 2-PAK) 0.3 mg/0.3 mL IJ SOAJ injection Inject 0.3 mLs (0.3 mg total) into the skin as needed (systemic reaction to seafood allergy). (Patient not taking: Reported on 03/20/2021) 1 Device 1   No facility-administered medications prior to visit.    PAST MEDICAL HISTORY: Past Medical History:  Diagnosis Date   Anxiety    Arthritis    Asthma    Depression    Depression    Fibromyalgia    History of chlamydia 09/06/2017   History of trichomoniasis 09/06/2017   + RNA test, neg wet prep in office POCT   Hypertension  Iron deficiency anemia    Kienbck's disease    LEFT WRIST   Left wrist pain    retained deep implant    PAST SURGICAL HISTORY: Past Surgical History:  Procedure Laterality Date   APPENDECTOMY     LAPAROSCOPIC APPENDECTOMY N/A 11/04/2013   Procedure: APPENDECTOMY LAPAROSCOPIC;  Surgeon: Valarie Merino, MD;  Location: [facility];  Service: General;  Laterality: N/A;   REMOVAL OF IMPLANT Left 06/13/2015   Procedure: LEFT WRIST DEEP IMPLANT REMOVAL ;  Surgeon: Bradly Bienenstock, MD;  Location: [facility];  Service: Orthopedics;  Laterality: Left;   WRIST RECONSTRUCTION Left 04-15-2015    for Kienbock's disease     FAMILY HISTORY: Family History  Problem Relation Age of Onset    Depression Mother    Diabetes Mother    Mental illness Mother    Anxiety disorder Mother    COPD Father    Mental illness Father    Hypertension Father    Hyperlipidemia Father    Depression Maternal Grandmother    Mental illness Maternal Grandmother    Diabetes Maternal Grandfather    Mental illness Maternal Grandfather    Cancer Paternal Grandfather    Mental illness Paternal Grandfather    Mental illness Sister    Mental illness Sister     SOCIAL HISTORY: Social History   Socioeconomic History   Marital status: Married    Spouse name: [name]   Number of children: 1   Years of education: Assoc   Highest education level: Not on file  Occupational History    Comment: not employed  Tobacco Use   Smoking status: Former    Years: 5.00    Types: Cigarettes    Quit date: 04/06/1996    Years since quitting: 24.9   Smokeless tobacco: Never  Vaping Use   Vaping Use: Never used  Substance and Sexual Activity   Alcohol use: Yes    Alcohol/week: 0.0 standard drinks    Comment: occas.   Drug use: Yes    Types: Marijuana   Sexual activity: Not on file    Comment: 2015-- Placement Mirena IUD  Other Topics Concern   Not on file  Social History Narrative   Patient consumes 4-5 cups of caffeine daily.   Social Determinants of Health   Financial Resource Strain: Not on file  Food Insecurity: Not on file  Transportation Needs: Not on file  Physical Activity: Not on file  Stress: Not on file  Social Connections: Not on file  Intimate Partner Violence: Not on file      PHYSICAL EXAM  Vitals:   03/20/21 0959  Weight: 249 lb (112.9 kg)  Height: 5\' 3"  (1.6 m)   Body mass index is 44.11 kg/m.  Generalized: Well developed, in no acute distress  Chest: Lungs clear to auscultation bilaterally  Neurological examination  Mentation: Alert oriented to time, place, history taking. Follows all commands speech and language fluent Cranial nerve II-XII: Extraocular movements  were full, visual field were full on confrontational test Head turning and shoulder shrug  were normal and symmetric. Motor: The motor testing reveals 5 over 5 strength of all 4 extremities. Good symmetric motor tone is noted throughout.  Sensory: Sensory testing is intact to soft touch on all 4 extremities. No evidence of extinction is noted.  Gait and station: Gait is normal.    DIAGNOSTIC DATA (LABS, IMAGING, TESTING) - I reviewed patient records, labs, notes, testing and imaging myself where available.  Lab Results  Component Value Date   WBC 8.4 07/01/2017   HGB 13.9 07/01/2017   HCT 40.8 07/01/2017   MCV 92 07/01/2017   PLT 360 07/01/2017      Component Value Date/Time   NA 140 07/01/2017 1218   K 4.3 07/01/2017 1218   CL 105 07/01/2017 1218   CO2 21 07/01/2017 1218   GLUCOSE 87 07/01/2017 1218   GLUCOSE 80 08/01/2015 1345   BUN 13 07/01/2017 1218   CREATININE 0.77 07/01/2017 1218   CREATININE 0.77 08/01/2015 1345   CALCIUM 9.4 07/01/2017 1218   PROT 7.4 07/01/2017 1218   ALBUMIN 4.6 07/01/2017 1218   AST 15 07/01/2017 1218   ALT 17 07/01/2017 1218   ALKPHOS 85 07/01/2017 1218   BILITOT <0.2 07/01/2017 1218   GFRNONAA 96 07/01/2017 1218   GFRAA 111 07/01/2017 1218   No results found for: CHOL, HDL, LDLCALC, LDLDIRECT, TRIG, CHOLHDL Lab Results  Component Value Date   HGBA1C 5.0 07/01/2017   Lab Results  Component Value Date   VITAMINB12 352 07/01/2017   Lab Results  Component Value Date   TSH 1.240 07/01/2017      ASSESSMENT AND PLAN 45 y.o. year old female  has a past medical history of Anxiety, Arthritis, Asthma, Depression, Depression, Fibromyalgia, History of chlamydia (09/06/2017), History of trichomoniasis (09/06/2017), Hypertension, Iron deficiency anemia, Kienbck's disease, and Left wrist pain. here with:  OSA on CPAP  - CPAP compliance excellent - Good treatment of AHI  - Encourage patient to use CPAP nightly and > 4 hours each night - F/U  in 1 year or sooner if needed   Butch Penny, MSN, NP-C 03/20/2021, 10:24 AM [facility] [address] [place], [place] [address] [phone]

## 2021-03-24 NOTE — Progress Notes (Addendum)
I called and LMVM for pt that received this message and left her # to call them back.   Ross Ludwig, RN; Dimas Millin That mask is on recall. I called patient to let her know and someone picked up and hung up.      Previous Messages   ----- Message -----  From: Guy Begin, RN  Sent: 03/20/2021  12:43 PM EST  To: Dimas Millin, Jari Favre, *  Subject: new order                                       Hey, new order in epic for pt  Lorie Phenix "Camelia Eng"  Female, 45 y.o., [birth year]  Pronouns:  she/her/hers  MRN:  [id number]  Thanks SY

## 2021-04-29 LAB — OB RESULTS CONSOLE GC/CHLAMYDIA: Chlamydia: NEGATIVE

## 2021-04-29 LAB — RESULTS CONSOLE HPV: CHL HPV: NEGATIVE

## 2021-04-29 LAB — HM PAP SMEAR: HM Pap smear: NEGATIVE

## 2021-05-09 LAB — HM MAMMOGRAPHY

## 2022-01-01 DIAGNOSIS — M25531 Pain in right wrist: Secondary | ICD-10-CM | POA: Insufficient documentation

## 2022-02-22 ENCOUNTER — Ambulatory Visit (HOSPITAL_COMMUNITY)
Admission: EM | Admit: 2022-02-22 | Discharge: 2022-02-22 | Disposition: A | Discharge status: Home / Self Care | Payer: BC Managed Care – PPO | Attending: Family Medicine | Admitting: Family Medicine

## 2022-02-22 ENCOUNTER — Encounter (HOSPITAL_COMMUNITY): Payer: Self-pay

## 2022-02-22 DIAGNOSIS — Z202 Contact with and (suspected) exposure to infections with a predominantly sexual mode of transmission: Secondary | ICD-10-CM | POA: Insufficient documentation

## 2022-02-22 LAB — HIV ANTIBODY (ROUTINE TESTING W REFLEX): HIV Screen 4th Generation wRfx: NONREACTIVE

## 2022-02-22 MED ORDER — METRONIDAZOLE 500 MG PO TABS: 500.0000 mg | ORAL_TABLET | Freq: Two times a day (BID) | ORAL | 0 refills | Status: AC

## 2022-02-22 NOTE — Discharge Instructions (Addendum)
Staff will notify you if anything is positive on the swab or the blood tests.  Take metronidazole 500 mg--1 tablet 2 times daily for 7 days.  Avoid drinking alcohol within 72 hours of taking this medication  Please consider going to the [facility] at [phone]; they offer PrEP services there

## 2022-02-22 NOTE — ED Provider Notes (Addendum)
Tonya Perry    CSN: [id number] Arrival date & time: 02/22/22  1646      History   Chief Complaint Chief Complaint  Patient presents with   SEXUALLY TRANSMITTED DISEASE    Possibly Trich - Entered by patient   Exposure to STD    HPI Tonya Perry is a 46 y.o. female.    Exposure to STD   Here for exposure to trichomonas.  She has no vaginal discharge or itching.  No abdominal pain or fever or vomiting.  He has a Mirena IUD so she does not know when her last period was  She states her partner is polyamorous and he was exposed through another partner to [place].  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   Fibromyalgia 10/27/2014   Asthma, mild intermittent, uncomplicated [id number]   Chronic allergic conjunctivitis 08/22/2014   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   Rash and nonspecific skin eruption 08/22/2014   Depression 07/18/2014   Anxiety 07/18/2014   Counseling on substance use and abuse 07/18/2014   Arthritis 07/18/2014   Allergic rhinitis 01/22/2014    Past Surgical History:  Procedure Laterality Date   APPENDECTOMY     LAPAROSCOPIC APPENDECTOMY N/A 11/04/2013   Procedure: APPENDECTOMY LAPAROSCOPIC;  Surgeon: Pedro Earls, MD;  Location: [facility];  Service: General;  Laterality: N/A;   REMOVAL OF IMPLANT Left 06/13/2015   Procedure: LEFT WRIST DEEP IMPLANT REMOVAL ;  Surgeon: Iran Planas, MD;  Location: [place];   Service: Orthopedics;  Laterality: Left;   WRIST RECONSTRUCTION Left 04-15-2015    for Kienbock's disease     OB History   No obstetric history on file.      Home Medications    Prior to Admission medications   Medication Sig Start Date End Date Taking? Authorizing Provider  metroNIDAZOLE (FLAGYL) 500 MG tablet Take 1 tablet (500 mg total) by mouth 2 (two) times daily for 7 days. 02/22/22 03/01/22 Yes Andraya Frigon, Gwenlyn Perking, MD  acetaminophen (TYLENOL) 500 MG tablet Take 500 mg by mouth.    [provider]  albuterol (PROAIR HFA) 108 (90 Base) MCG/ACT inhaler Inhale 2 puffs into the lungs as needed for wheezing (2 puffs as needed every 4-6h as needed for cough/wheeze). 04/27/18   Horald Pollen, MD  cetirizine (ZYRTEC) 10 MG tablet Take 1 tablet (10 mg total) by mouth at bedtime. 04/27/18   Horald Pollen, MD  clotrimazole-betamethasone (LOTRISONE) cream Apply 1 application topically 2 (two) times daily. 04/27/18   Horald Pollen, MD  EPINEPHrine (EPIPEN 2-PAK) 0.3 mg/0.3 mL IJ SOAJ injection Inject 0.3 mLs (0.3 mg total) into the skin as needed (systemic reaction to seafood allergy). Patient not taking: Reported on 03/20/2021 04/27/18   Horald Pollen, MD  fluticasone [facility]) 50 MCG/ACT nasal spray SPRAY 2 SPRAYS INTO EACH NOSTRIL EVERY DAY  03/03/19   Horald Pollen, MD  gabapentin (NEURONTIN) 600 MG tablet Take 1 tab po qam and 1 tab po qafternoon and 2 tabs po qhs Patient taking differently: 400 mg 3 (three) times daily. Take 1 tab po qam and 1 tab po qafternoon and 2 tabs po qhs 07/01/17   Shawnee Knapp, MD  ibuprofen (ADVIL,MOTRIN) 800 MG tablet Take 1 tablet (800 mg total) by mouth every 8 (eight) hours as needed. 04/27/18   Horald Pollen, MD  lamoTRIgine (LAMICTAL) 25 MG tablet Take 25 mg by mouth 4 (four) times daily. 11/09/16   Eino Farber, PA-C  Multiple Vitamins-Minerals (MULTIVITAMIN WITH MINERALS) tablet Take 1 tablet by mouth daily.     [provider]  naltrexone (DEPADE) 50 MG tablet Take 50 mg by mouth daily. 01/09/20   [provider]  omeprazole (PRILOSEC) 40 MG capsule omeprazole 40 mg capsule,delayed release  TAKE 1 CAP BY MOUTH 30 MINUTES BEFORE A MEAL    [provider]  sertraline (ZOLOFT) 100 MG tablet sertraline 100 mg tablet    [provider]  traZODone (DESYREL) 50 MG tablet Take 50 mg by mouth at bedtime.    [provider]    Family History Family History  Problem Relation Age of Onset   Depression Mother    Diabetes Mother    Mental illness Mother    Anxiety disorder Mother    COPD Father    Mental illness Father    Hypertension Father    Hyperlipidemia Father    Depression Maternal Grandmother    Mental illness Maternal Grandmother    Diabetes Maternal Grandfather    Mental illness Maternal Grandfather    Cancer Paternal Grandfather    Mental illness Paternal Grandfather    Mental illness Sister    Mental illness Sister     Social History Social History   Tobacco Use   Smoking status: Former    Years: 5.00    Types: Cigarettes    Quit date: 04/06/1996    Years since quitting: 25.8   Smokeless tobacco: Never  Vaping Use   Vaping Use: Never used  Substance Use Topics   Alcohol use: Yes    Alcohol/week: 0.0 standard drinks of alcohol    Comment: occas.   Drug use: Yes    Types: Marijuana     Allergies   Azithromycin, Latex, Other, Shellfish allergy, Voltaren [diclofenac sodium], and Penicillins   Review of Systems Review of Systems   Physical Exam Triage Vital Signs ED Triage Vitals [02/22/22 1807]  Enc Vitals Group     BP 139/89     Pulse Rate 84     Resp      Temp 98.8 F (37.1 C)     Temp Source Oral     SpO2 98 %     Weight      Height      Head Circumference      Peak Flow      Pain Score      Pain Loc      Pain Edu?      Excl. in [place]?    No data found.  Updated Vital Signs BP 139/89 (BP Location: Right Arm)    Pulse 84   Temp 98.8 F (37.1 C) (Oral)   LMP  (LMP Unknown)   SpO2 98%   Visual Acuity Right Eye Distance:   Left Eye Distance:   Bilateral Distance:    Right Eye Near:  Left Eye Near:    Bilateral Near:     Physical Exam Vitals reviewed.  Skin:    Coloration: Skin is not jaundiced or pale.  Neurological:     Mental Status: She is alert and oriented to person, place, and time.  Psychiatric:        Behavior: Behavior normal.      UC Treatments / Results  Labs (all labs ordered are listed, but only abnormal results are displayed) Labs Reviewed  HIV ANTIBODY (ROUTINE TESTING W REFLEX)  RPR  CERVICOVAGINAL ANCILLARY ONLY    EKG   Radiology No results found.  Procedures Procedures (including critical care time)  Medications Ordered in UC Medications - No data to display  Initial Impression / Assessment and Plan / UC Course  I have reviewed the triage vital signs and the nursing notes.  Pertinent labs & imaging results that were available during my care of the patient were reviewed by me and considered in my medical decision making (see chart for details).        Staff will notify her if anything is positive on the swab.  I discussed with her considering using barrier protection and she is definitely interested in PrEP.  She is given information on the [facility] and PrEP services there.  Assistance is requested to help her find a primary care and instructions are given on self scheduling at the [facility] health website Final Clinical Impressions(s) / UC Diagnoses   Final diagnoses:  STD exposure     Discharge Instructions      Staff will notify you if anything is positive on the swab or the blood tests.  Take metronidazole 500 mg--1 tablet 2 times daily for 7 days.  Avoid drinking alcohol within 72 hours of taking this medication  Please consider going to the regional center for infectious disease at [phone]; they  offer PrEP services there     ED Prescriptions     Medication Sig Dispense Auth. Provider   metroNIDAZOLE (FLAGYL) 500 MG tablet Take 1 tablet (500 mg total) by mouth 2 (two) times daily for 7 days. 14 tablet Deundra Furber, Janace Aris, MD      PDMP not reviewed this encounter.   Zenia Resides, MD 02/22/22 1836    Zenia Resides, MD 02/22/22 [phone]

## 2022-02-22 NOTE — ED Triage Notes (Signed)
Pt is here for STD exposure

## 2022-02-23 LAB — CERVICOVAGINAL ANCILLARY ONLY
Bacterial Vaginitis (gardnerella): NEGATIVE
Candida Glabrata: NEGATIVE
Candida Vaginitis: NEGATIVE
Chlamydia: NEGATIVE
Comment: NEGATIVE
Comment: NEGATIVE
Comment: NEGATIVE
Comment: NEGATIVE
Comment: NEGATIVE
Comment: NORMAL
Neisseria Gonorrhea: NEGATIVE
Trichomonas: NEGATIVE

## 2022-02-23 LAB — RPR: RPR Ser Ql: NONREACTIVE

## 2022-03-11 ENCOUNTER — Ambulatory Visit
Admission: EM | Admit: 2022-03-11 | Discharge: 2022-03-11 | Disposition: A | Discharge status: Home / Self Care | Payer: Medicare Other | Attending: Emergency Medicine | Admitting: Emergency Medicine

## 2022-03-11 DIAGNOSIS — J309 Allergic rhinitis, unspecified: Secondary | ICD-10-CM | POA: Diagnosis not present

## 2022-03-11 DIAGNOSIS — J020 Streptococcal pharyngitis: Secondary | ICD-10-CM

## 2022-03-11 DIAGNOSIS — J029 Acute pharyngitis, unspecified: Secondary | ICD-10-CM

## 2022-03-11 LAB — POCT RAPID STREP A (OFFICE): Rapid Strep A Screen: POSITIVE — AB

## 2022-03-11 MED ORDER — CEFDINIR 300 MG PO CAPS: 300.0000 mg | ORAL_CAPSULE | Freq: Two times a day (BID) | ORAL | 0 refills | Status: AC

## 2022-03-11 MED ORDER — CETIRIZINE HCL 10 MG PO TABS: 10.0000 mg | ORAL_TABLET | Freq: Every day | ORAL | 1 refills | Status: DC

## 2022-03-11 MED ORDER — FLUTICASONE PROPIONATE 50 MCG/ACT NA SUSP: 1.0000 | Freq: Every day | NASAL | 2 refills | Status: DC

## 2022-03-11 NOTE — ED Triage Notes (Signed)
Pt c/o left sided sore throat/ discomfort, watery eyes and congestion that began last night.   Home interventions: ibuprofen, theraflu

## 2022-03-11 NOTE — ED Provider Notes (Signed)
[facility]    CSN: [id number] Arrival date & time: 03/11/22  1637    HISTORY   Chief Complaint  Patient presents with   Sore Throat   Nasal Congestion   HPI Tonya Perry is a pleasant, 46 y.o. female who presents to urgent care today. Her daughter was sick about a week ago and is feeling better now, states she did not test her for influenza or COVID-19.  Patient states that yesterday she began to have left-sided throat pain, pain with swallowing, watery eyes and nasal congestion.  Patient states has been taking ibuprofen and TheraFlu without meaningful resolution of her symptoms.  Patient denies fever, cough, otalgia, nausea, vomiting, diarrhea.  Patient has normal vital signs on arrival today.  The history is provided by the patient.   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   Fibromyalgia 10/27/2014   Asthma, mild intermittent, uncomplicated [id number]   Chronic allergic conjunctivitis 08/22/2014   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   Rash and nonspecific skin eruption 08/22/2014   Depression 07/18/2014   Anxiety 07/18/2014   Counseling on substance use and abuse 07/18/2014   Arthritis 07/18/2014   Allergic rhinitis 01/22/2014   Past Surgical History:  Procedure Laterality Date   APPENDECTOMY     LAPAROSCOPIC APPENDECTOMY N/A 11/04/2013   Procedure: APPENDECTOMY LAPAROSCOPIC;  Surgeon: Pedro Earls, MD;  Location: [facility];  Service: General;  Laterality: N/A;    REMOVAL OF IMPLANT Left 06/13/2015   Procedure: LEFT WRIST DEEP IMPLANT REMOVAL ;  Surgeon: Iran Planas, MD;  Location: [place];  Service: Orthopedics;  Laterality: Left;   WRIST RECONSTRUCTION Left 04-15-2015    for Kienbock's disease    OB History   No obstetric history on file.    Home Medications    Prior to Admission medications   Medication Sig Start Date End Date Taking? Authorizing Provider  acetaminophen (TYLENOL) 500 MG tablet Take 500 mg by mouth.    [provider]  albuterol (PROAIR HFA) 108 (90 Base) MCG/ACT inhaler Inhale 2 puffs into the lungs as needed for wheezing (2 puffs as needed every 4-6h as needed for cough/wheeze). 04/27/18   Horald Pollen, MD  cetirizine (ZYRTEC) 10 MG tablet Take 1 tablet (10 mg total) by mouth at bedtime. 04/27/18   Horald Pollen, MD  clotrimazole-betamethasone (LOTRISONE) cream Apply 1 application topically 2 (two) times daily. 04/27/18   Horald Pollen, MD  EPINEPHrine (EPIPEN 2-PAK) 0.3 mg/0.3 mL IJ SOAJ injection Inject 0.3 mLs (0.3 mg total) into the skin as needed (systemic reaction to seafood allergy). Patient not taking: Reported on 03/20/2021 04/27/18   Horald Pollen, MD  fluticasone [facility]) 50 MCG/ACT nasal spray SPRAY 2 SPRAYS INTO EACH NOSTRIL EVERY DAY 03/03/19   Horald Pollen, MD  gabapentin (NEURONTIN) 600 MG tablet Take 1 tab po qam and 1 tab po qafternoon  and 2 tabs po qhs Patient taking differently: 400 mg 3 (three) times daily. Take 1 tab po qam and 1 tab po qafternoon and 2 tabs po qhs 07/01/17   Shawnee Knapp, MD  ibuprofen (ADVIL,MOTRIN) 800 MG tablet Take 1 tablet (800 mg total) by mouth every 8 (eight) hours as needed. 04/27/18   Horald Pollen, MD  lamoTRIgine (LAMICTAL) 25 MG tablet Take 25 mg by mouth 4 (four) times daily. 11/09/16   Eino Farber, PA-C  Multiple Vitamins-Minerals (MULTIVITAMIN WITH MINERALS) tablet Take 1 tablet by mouth daily.    [provider]  naltrexone (DEPADE) 50 MG tablet Take 50 mg by mouth daily. 01/09/20   [provider]  omeprazole (PRILOSEC) 40 MG capsule omeprazole 40 mg capsule,delayed release  TAKE 1 CAP BY MOUTH 30 MINUTES BEFORE A MEAL    [provider]  sertraline (ZOLOFT) 100 MG tablet sertraline 100 mg tablet    [provider]  traZODone (DESYREL) 50 MG tablet Take 50 mg by mouth at bedtime.    [provider]    Family History Family History  Problem Relation Age of Onset   Depression Mother    Diabetes Mother    Mental illness Mother    Anxiety disorder Mother    COPD Father    Mental illness Father    Hypertension Father    Hyperlipidemia Father    Depression Maternal Grandmother    Mental illness Maternal Grandmother    Diabetes Maternal Grandfather    Mental illness Maternal Grandfather    Cancer Paternal Grandfather    Mental illness Paternal Grandfather    Mental illness Sister    Mental illness Sister    Social History Social History   Tobacco Use   Smoking status: Former    Years: 5.00    Types: Cigarettes    Quit date: 04/06/1996    Years since quitting: 25.9   Smokeless tobacco: Never  Vaping Use   Vaping Use: Never used  Substance Use Topics   Alcohol use: Yes    Alcohol/week: 0.0 standard drinks of alcohol    Comment: occas.   Drug use: Yes    Types: Marijuana   Allergies   Azithromycin, Latex, Other, Shellfish allergy, Voltaren [diclofenac sodium], and Penicillins  Review of Systems Review of Systems Pertinent findings revealed after performing a 14 point review of systems has been noted in the history of present illness.  Physical Exam Triage Vital Signs ED Triage Vitals  Enc Vitals Group     BP 01/31/21 0827 (!) 147/82     Pulse Rate 01/31/21 0827 72     Resp 01/31/21 0827 18     Temp 01/31/21 0827 98.3 F (36.8 C)     Temp Source 01/31/21 0827 Oral     SpO2 01/31/21 0827 98 %     Weight --      Height --       Head Circumference --      Peak Flow --      Pain Score 01/31/21 0826 5     Pain Loc --      Pain Edu? --      Excl. in [place]? --   No data found.  Updated Vital Signs BP 125/85 (BP Location: Right Arm)   Pulse 75   Temp 97.7 F (36.5 C) (Oral)   Resp 17   LMP  (LMP Unknown)   SpO2 96%   Physical Exam Vitals and nursing note  reviewed.  Constitutional:      General: She is awake. She is not in acute distress.    Appearance: Normal appearance. She is well-developed and well-groomed. She is morbidly obese. She is not ill-appearing.  HENT:     Head: Normocephalic and atraumatic.     Salivary Glands: Right salivary gland is not diffusely enlarged or tender. Left salivary gland is not diffusely enlarged or tender.     Right Ear: Tympanic membrane, ear canal and external ear normal. No drainage. No middle ear effusion. There is no impacted cerumen. Tympanic membrane is not erythematous or bulging.     Left Ear: Tympanic membrane, ear canal and external ear normal. No drainage.  No middle ear effusion. There is no impacted cerumen. Tympanic membrane is not erythematous or bulging.     Nose: Congestion and rhinorrhea present. No nasal deformity, septal deviation or mucosal edema. Rhinorrhea is clear.     Right Turbinates: Not enlarged, swollen or pale.     Left Turbinates: Not enlarged, swollen or pale.     Right Sinus: No maxillary sinus tenderness or frontal sinus tenderness.     Left Sinus: No maxillary sinus tenderness or frontal sinus tenderness.     Mouth/Throat:     Lips: Pink. No lesions.     Mouth: Mucous membranes are moist. No oral lesions.     Dentition: Normal dentition.     Tongue: No lesions. Tongue does not deviate from midline.     Palate: No mass and lesions.     Pharynx: Oropharynx is clear. Uvula midline. Posterior oropharyngeal erythema present. No pharyngeal swelling, oropharyngeal exudate or uvula swelling.     Tonsils: No tonsillar exudate or tonsillar  abscesses. 0 on the right. 0 on the left.  Eyes:     General: Lids are normal.        Right eye: No discharge.        Left eye: No discharge.     Extraocular Movements: Extraocular movements intact.     Conjunctiva/sclera: Conjunctivae normal.     Right eye: Right conjunctiva is not injected.     Left eye: Left conjunctiva is not injected.  Neck:     Trachea: Trachea and phonation normal.  Cardiovascular:     Rate and Rhythm: Normal rate and regular rhythm.     Pulses: Normal pulses.     Heart sounds: Normal heart sounds, S1 normal and S2 normal. Heart sounds not distant. No murmur heard.    No systolic murmur is present.     No diastolic murmur is present.     No friction rub. No gallop. No S3 or S4 sounds.  Pulmonary:     Effort: Pulmonary effort is normal. No tachypnea, bradypnea, accessory muscle usage, prolonged expiration, respiratory distress or retractions.     Breath sounds: Normal breath sounds. No stridor, decreased air movement or transmitted upper airway sounds. No decreased breath sounds, wheezing, rhonchi or rales.  Chest:     Chest wall: No tenderness.  Musculoskeletal:        General: Normal range of motion.     Cervical back: Full passive range of motion without pain, normal range of motion and neck supple. Normal range of motion.  Lymphadenopathy:     Cervical: No cervical adenopathy.  Skin:    General: Skin is warm and dry.     Findings: No erythema or rash.  Neurological:     General: No focal deficit present.     Mental  Status: She is alert and oriented to person, place, and time.  Psychiatric:        Mood and Affect: Mood normal.        Behavior: Behavior normal. Behavior is cooperative.     Visual Acuity Right Eye Distance:   Left Eye Distance:   Bilateral Distance:    Right Eye Near:   Left Eye Near:    Bilateral Near:     [facility] / Diagnostics / Procedures:     Radiology No results found.  Procedures Procedures (including critical  care time) EKG  Pending results:  Labs Reviewed  POCT RAPID STREP A (OFFICE) - Abnormal; Notable for the following components:      Result Value   Rapid Strep A Screen Positive (*)    All other components within normal limits    Medications Ordered in UC: Medications - No data to display  UC Diagnoses / Final Clinical Impressions(s)   I have reviewed the triage vital signs and the nursing notes.  Pertinent labs & imaging results that were available during my care of the patient were reviewed by me and considered in my medical decision making (see chart for details).    Final diagnoses:  Acute pharyngitis, unspecified etiology  Acute streptococcal pharyngitis  Allergic rhinitis, unspecified seasonality, unspecified trigger   Rapid strep test today was positive.  Physical exam findings also concerning for allergies.  Patient provided with cefdinir given allergy to penicillin.  Patient provided with Flonase and Zyrtec for allergy symptoms.  Return precautions advised.  ED Prescriptions     Medication Sig Dispense Auth. Provider   cetirizine (ZYRTEC ALLERGY) 10 MG tablet Take 1 tablet (10 mg total) by mouth at bedtime. 90 tablet Lynden Oxford Scales, PA-C   fluticasone (FLONASE) 50 MCG/ACT nasal spray Place 1 spray into both nostrils daily. Begin by using 2 sprays in each nare daily for 3 to 5 days, then decrease to 1 spray in each nare daily. 15.8 mL Lynden Oxford Scales, PA-C   cefdinir (OMNICEF) 300 MG capsule Take 1 capsule (300 mg total) by mouth 2 (two) times daily for 10 days. 20 capsule Lynden Oxford Scales, PA-C      PDMP not reviewed this encounter.  Disposition Upon Discharge:  Condition: stable for discharge home Home: take medications as prescribed; routine discharge instructions as discussed; follow up as advised.  Patient presented with an acute illness with associated systemic symptoms and significant discomfort requiring urgent management. In my opinion,  this is a condition that a prudent lay person (someone who possesses an average knowledge of health and medicine) may potentially expect to result in complications if not addressed urgently such as respiratory distress, impairment of bodily function or dysfunction of bodily organs.   Routine symptom specific, illness specific and/or disease specific instructions were discussed with the patient and/or caregiver at length.   As such, the patient has been evaluated and assessed, work-up was performed and treatment was provided in alignment with urgent care protocols and evidence based medicine.  Patient/parent/caregiver has been advised that the patient may require follow up for further testing and treatment if the symptoms continue in spite of treatment, as clinically indicated and appropriate.  If the patient was tested for COVID-19, Influenza and/or RSV, then the patient/parent/guardian was advised to isolate at home pending the results of his/her diagnostic coronavirus test and potentially longer if they're positive. I have also advised pt that if his/her COVID-19 test returns positive, it's recommended to self-isolate for  at least 10 days after symptoms first appeared AND until fever-free for 24 hours without fever reducer AND other symptoms have improved or resolved. Discussed self-isolation recommendations as well as instructions for household member/close contacts as per the [facility] and [place] DHHS, and also gave patient the [facility] packet with this information.  Patient/parent/caregiver has been advised to return to the [facility] or PCP in 3-5 days if no better; to PCP or the Emergency Department if new signs and symptoms develop, or if the current signs or symptoms continue to change or worsen for further workup, evaluation and treatment as clinically indicated and appropriate  The patient will follow up with their current PCP if and as advised. If the patient does not currently have a PCP we will assist them in  obtaining one.   The patient may need specialty follow up if the symptoms continue, in spite of conservative treatment and management, for further workup, evaluation, consultation and treatment as clinically indicated and appropriate.  Patient/parent/caregiver verbalized understanding and agreement of plan as discussed.  All questions were addressed during visit.  Please see discharge instructions below for further details of plan.  Discharge Instructions:   Discharge Instructions      Your strep test today is positive.  I recommend that you begin antibiotics now for treatment.  I have sent a prescription to your pharmacy.  Please take them as prescribed.  You will begin to feel better in about 24 hours.  Please be sure that you you finish the entire 10-day course of treatment to avoid worsening infection that may require longer treatment with stronger antibiotics.   After 24 hours of taking antibiotics, you are no longer contagious.  Please discard your toothbrush as well as any other oral devices that you are currently using and replace them with new ones to avoid reinfection.   Please read below to learn more about the medications, dosages and frequencies that I recommend to help alleviate your symptoms and to get you feeling better soon:  Omnicef (cefdinir): To treat your strep throat, please take 1 capsule twice daily for 10 days, you can take it with or without food.  This antibiotic can cause upset stomach, this will resolve once antibiotics are complete.  You are welcome to use a probiotic, eat yogurt, take Imodium while taking this medication.  Please avoid other systemic medications such as Maalox, Pepto-Bismol or milk of magnesia as they can interfere with your body's ability to absorb the antibiotics.       Zyrtec (cetirizine): This is an excellent second-generation antihistamine that helps to reduce respiratory inflammatory response to environmental allergens.  In some patients, this  medication can cause daytime sleepiness so I recommend that you take 1 tablet daily at bedtime.     Flonase (fluticasone): This is a steroid nasal spray that you use once daily, 1 spray in each nare.  This medication does not work well if you decide to use it only used as you feel you need to, it works best used on a daily basis.  After 3 to 5 days of use, you will notice significant reduction of the inflammation and mucus production that is currently being caused by exposure to allergens, whether seasonal or environmental.  The most common side effect of this medication is nosebleeds.  If you experience a nosebleed, please discontinue use for 1 week, then feel free to resume.  I have provided you with a prescription.      Please follow-up within the next  7-10 days either with your primary care provider or urgent care if your symptoms do not resolve.  If you do not have a primary care provider, we will assist you in finding one.        Thank you for visiting urgent care today.  We appreciate the opportunity to participate in your care.         This office note has been dictated using Teaching laboratory technician.  Unfortunately, this method of dictation can sometimes lead to typographical or grammatical errors.  I apologize for your inconvenience in advance if this occurs.  Please do not hesitate to reach out to me if clarification is needed.      Theadora Rama Scales, PA-C 03/11/22 1829

## 2022-03-11 NOTE — Discharge Instructions (Addendum)
Your strep test today is positive.  I recommend that you begin antibiotics now for treatment.  I have sent a prescription to your pharmacy.  Please take them as prescribed.  You will begin to feel better in about 24 hours.  Please be sure that you you finish the entire 10-day course of treatment to avoid worsening infection that may require longer treatment with stronger antibiotics.   After 24 hours of taking antibiotics, you are no longer contagious.  Please discard your toothbrush as well as any other oral devices that you are currently using and replace them with new ones to avoid reinfection.   Please read below to learn more about the medications, dosages and frequencies that I recommend to help alleviate your symptoms and to get you feeling better soon:  Omnicef (cefdinir): To treat your strep throat, please take 1 capsule twice daily for 10 days, you can take it with or without food.  This antibiotic can cause upset stomach, this will resolve once antibiotics are complete.  You are welcome to use a probiotic, eat yogurt, take Imodium while taking this medication.  Please avoid other systemic medications such as Maalox, Pepto-Bismol or milk of magnesia as they can interfere with your body's ability to absorb the antibiotics.       Zyrtec (cetirizine): This is an excellent second-generation antihistamine that helps to reduce respiratory inflammatory response to environmental allergens.  In some patients, this medication can cause daytime sleepiness so I recommend that you take 1 tablet daily at bedtime.     Flonase (fluticasone): This is a steroid nasal spray that you use once daily, 1 spray in each nare.  This medication does not work well if you decide to use it only used as you feel you need to, it works best used on a daily basis.  After 3 to 5 days of use, you will notice significant reduction of the inflammation and mucus production that is currently being caused by exposure to allergens,  whether seasonal or environmental.  The most common side effect of this medication is nosebleeds.  If you experience a nosebleed, please discontinue use for 1 week, then feel free to resume.  I have provided you with a prescription.      Please follow-up within the next 7-10 days either with your primary care provider or urgent care if your symptoms do not resolve.  If you do not have a primary care provider, we will assist you in finding one.        Thank you for visiting urgent care today.  We appreciate the opportunity to participate in your care.

## 2022-03-19 ENCOUNTER — Encounter: Payer: Self-pay | Admitting: *Deleted

## 2022-03-23 ENCOUNTER — Ambulatory Visit (INDEPENDENT_AMBULATORY_CARE_PROVIDER_SITE_OTHER): Payer: BC Managed Care – PPO | Admitting: Adult Health

## 2022-03-23 ENCOUNTER — Encounter: Payer: Self-pay | Admitting: Adult Health

## 2022-03-23 VITALS — BP 127/73 | HR 83 | Ht 64.0 in | Wt 255.4 lb

## 2022-03-23 DIAGNOSIS — G4733 Obstructive sleep apnea (adult) (pediatric): Secondary | ICD-10-CM | POA: Diagnosis not present

## 2022-03-23 NOTE — Patient Instructions (Signed)
Continue using CPAP nightly and greater than 4 hours each night °If your symptoms worsen or you develop new symptoms please let us know.  ° °

## 2022-03-23 NOTE — Progress Notes (Signed)
PATIENT: Tonya Perry DOB: [birth year]  REASON FOR VISIT: follow up HISTORY FROM: patient PRIMARY NEUROLOGIST: Dr. Frances Furbish  Chief Complaint  Patient presents with   Follow-up    Pt in 20 Pt here for CPAP Pt states changed her mask . Pt states wearing CPAP more  Pt states still fatigue .      HISTORY OF PRESENT ILLNESS: Today 03/23/22:  Tonya Perry is a 46 year old female with a history of OSA on CPAP. She returns today for follow-up. Reports that CPAP is working well. Denies any new symptoms. CPAP is working well. Still has fatigue. Has not seen PCP.     03/20/21: Tonya Perry is a 46 year old female with a history of obstructive sleep apnea on BiPAP.  She states that she currently has a fullface mask.  She needs to have the DreamWear full face and liked it better.  She states current mask leaks a lot.      REVIEW OF SYSTEMS: Out of a complete 14 system review of symptoms, the patient complains only of the following symptoms, and all other reviewed systems are negative.  FSS 57 ESS 0  ALLERGIES: Allergies  Allergen Reactions   Azithromycin Diarrhea   Latex Itching   Other Itching and Other (See Comments)    Peas, Pineapple, Mango, Melons, Cantelope, Watermelons.  Dust mites, trees, grass and pollen.  Causes throat to swell with itching. Tree nuts, causes throat to swell with itching.   Shellfish Allergy Itching and Swelling   Voltaren [Diclofenac Sodium] Other (See Comments)    Upset stomach; made pt feel like had to go to bathroom   Penicillins Rash    HOME MEDICATIONS: Outpatient Medications Prior to Visit  Medication Sig Dispense Refill   acetaminophen (TYLENOL) 500 MG tablet Take 500 mg by mouth.     albuterol (PROAIR HFA) 108 (90 Base) MCG/ACT inhaler Inhale 2 puffs into the lungs as needed for wheezing (2 puffs as needed every 4-6h as needed for cough/wheeze). 1 Inhaler 5   cetirizine (ZYRTEC ALLERGY) 10 MG tablet Take 1 tablet (10 mg total) by  mouth at bedtime. 90 tablet 1   clotrimazole-betamethasone (LOTRISONE) cream Apply 1 application topically 2 (two) times daily. 45 g 1   EPINEPHrine (EPIPEN 2-PAK) 0.3 mg/0.3 mL IJ SOAJ injection Inject 0.3 mLs (0.3 mg total) into the skin as needed (systemic reaction to seafood allergy). 1 Device 1   fluticasone (FLONASE) 50 MCG/ACT nasal spray Place 1 spray into both nostrils daily. Begin by using 2 sprays in each nare daily for 3 to 5 days, then decrease to 1 spray in each nare daily. 15.8 mL 2   gabapentin (NEURONTIN) 600 MG tablet Take 1 tab po qam and 1 tab po qafternoon and 2 tabs po qhs (Patient taking differently: 400 mg 3 (three) times daily. Take 1 tab po qam and 1 tab po qafternoon and 2 tabs po qhs) 360 tablet 1   hydrochlorothiazide (HYDRODIURIL) 25 MG tablet Take 25 mg by mouth daily.     ibuprofen (ADVIL,MOTRIN) 800 MG tablet Take 1 tablet (800 mg total) by mouth every 8 (eight) hours as needed. 20 tablet 0   lamoTRIgine (LAMICTAL) 25 MG tablet Take 25 mg by mouth 4 (four) times daily.     Multiple Vitamins-Minerals (MULTIVITAMIN WITH MINERALS) tablet Take 1 tablet by mouth daily.     naltrexone (DEPADE) 50 MG tablet Take 50 mg by mouth daily.     omeprazole (PRILOSEC) 40 MG capsule  omeprazole 40 mg capsule,delayed release  TAKE 1 CAP BY MOUTH 30 MINUTES BEFORE A MEAL     sertraline (ZOLOFT) 100 MG tablet sertraline 100 mg tablet     traZODone (DESYREL) 50 MG tablet Take 50 mg by mouth at bedtime.     No facility-administered medications prior to visit.    PAST MEDICAL HISTORY: Past Medical History:  Diagnosis Date   Anxiety    Arthritis    Asthma    Depression    Depression    Fibromyalgia    History of chlamydia 09/06/2017   History of trichomoniasis 09/06/2017   + RNA test, neg wet prep in office POCT   Hypertension    Iron deficiency anemia    Kienbck's disease    LEFT WRIST   Left wrist pain    retained deep implant    PAST SURGICAL HISTORY: Past Surgical  History:  Procedure Laterality Date   APPENDECTOMY     LAPAROSCOPIC APPENDECTOMY N/A 11/04/2013   Procedure: APPENDECTOMY LAPAROSCOPIC;  Surgeon: Valarie Merino, MD;  Location: [facility];  Service: General;  Laterality: N/A;   REMOVAL OF IMPLANT Left 06/13/2015   Procedure: LEFT WRIST DEEP IMPLANT REMOVAL ;  Surgeon: Bradly Bienenstock, MD;  Location: [facility];  Service: Orthopedics;  Laterality: Left;   WRIST RECONSTRUCTION Left 04-15-2015    for Kienbock's disease     FAMILY HISTORY: Family History  Problem Relation Age of Onset   Depression Mother    Diabetes Mother    Mental illness Mother    Anxiety disorder Mother    COPD Father    Mental illness Father    Hypertension Father    Hyperlipidemia Father    Mental illness Sister    Mental illness Sister    Depression Maternal Grandmother    Mental illness Maternal Grandmother    Diabetes Maternal Grandfather    Mental illness Maternal Grandfather    Cancer Paternal Grandfather    Mental illness Paternal Grandfather    Sleep apnea Neg Hx     SOCIAL HISTORY: Social History   Socioeconomic History   Marital status: Married    Spouse name: [name]   Number of children: 1   Years of education: Assoc   Highest education level: Not on file  Occupational History    Comment: not employed  Tobacco Use   Smoking status: Former    Years: 5.00    Types: Cigarettes    Quit date: 04/06/1996    Years since quitting: 25.9   Smokeless tobacco: Never  Vaping Use   Vaping Use: Never used  Substance and Sexual Activity   Alcohol use: Yes   Drug use: Yes    Types: Marijuana   Sexual activity: Not on file    Comment: 2015-- Placement Mirena IUD  Other Topics Concern   Not on file  Social History Narrative   Patient consumes 4-5 cups of caffeine daily.   Social Determinants of Health   Financial Resource Strain: Not on file  Food Insecurity: Not on file  Transportation Needs: Not on file  Physical Activity: Not on  file  Stress: Not on file  Social Connections: Not on file  Intimate Partner Violence: Not on file      PHYSICAL EXAM  Vitals:   03/23/22 1013  BP: 127/73  Pulse: 83  Weight: 255 lb 6.4 oz (115.8 kg)  Height: 5\' 4"  (1.626 m)   Body mass index is 43.84 kg/m.  Generalized: Well developed, in  no acute distress  Chest: Lungs clear to auscultation bilaterally  Neurological examination  Mentation: Alert oriented to time, place, history taking. Follows all commands speech and language fluent Cranial nerve II-XII: Extraocular movements were full, visual field were full on confrontational test Head turning and shoulder shrug  were normal and symmetric. Motor: The motor testing reveals 5 over 5 strength of all 4 extremities. Good symmetric motor tone is noted throughout.  Sensory: Sensory testing is intact to soft touch on all 4 extremities. No evidence of extinction is noted.  Gait and station: Gait is normal.    DIAGNOSTIC DATA (LABS, IMAGING, TESTING) - I reviewed patient records, labs, notes, testing and imaging myself where available.  Lab Results  Component Value Date   WBC 8.4 07/01/2017   HGB 13.9 07/01/2017   HCT 40.8 07/01/2017   MCV 92 07/01/2017   PLT 360 07/01/2017      Component Value Date/Time   NA 140 07/01/2017 1218   K 4.3 07/01/2017 1218   CL 105 07/01/2017 1218   CO2 21 07/01/2017 1218   GLUCOSE 87 07/01/2017 1218   GLUCOSE 80 08/01/2015 1345   BUN 13 07/01/2017 1218   CREATININE 0.77 07/01/2017 1218   CREATININE 0.77 08/01/2015 1345   CALCIUM 9.4 07/01/2017 1218   PROT 7.4 07/01/2017 1218   ALBUMIN 4.6 07/01/2017 1218   AST 15 07/01/2017 1218   ALT 17 07/01/2017 1218   ALKPHOS 85 07/01/2017 1218   BILITOT <0.2 07/01/2017 1218   GFRNONAA 96 07/01/2017 1218   GFRAA 111 07/01/2017 1218   No results found for: "CHOL", "HDL", "LDLCALC", "LDLDIRECT", "TRIG", "CHOLHDL" Lab Results  Component Value Date   HGBA1C 5.0 07/01/2017   Lab Results   Component Value Date   VITAMINB12 352 07/01/2017   Lab Results  Component Value Date   TSH 1.240 07/01/2017      ASSESSMENT AND PLAN 46 y.o. year old female  has a past medical history of Anxiety, Arthritis, Asthma, Depression, Depression, Fibromyalgia, History of chlamydia (09/06/2017), History of trichomoniasis (09/06/2017), Hypertension, Iron deficiency anemia, Kienbck's disease, and Left wrist pain. here with:  OSA on CPAP  - CPAP compliance excellent - Good treatment of AHI  - Encourage patient to use CPAP nightly and > 4 hours each night - F/U in 1 year or sooner if needed   Butch Penny, MSN, NP-C 03/23/2022, 10:24 AM [facility] [address] [place], [place] [address] [phone]

## 2022-07-17 ENCOUNTER — Encounter: Payer: Self-pay | Admitting: Physician Assistant

## 2022-07-17 ENCOUNTER — Ambulatory Visit (INDEPENDENT_AMBULATORY_CARE_PROVIDER_SITE_OTHER): Payer: BC Managed Care – PPO | Admitting: Physician Assistant

## 2022-07-17 VITALS — BP 116/76 | HR 90 | Temp 97.8°F | Ht 64.0 in | Wt 263.6 lb

## 2022-07-17 DIAGNOSIS — F419 Anxiety disorder, unspecified: Secondary | ICD-10-CM | POA: Diagnosis not present

## 2022-07-17 DIAGNOSIS — L503 Dermatographic urticaria: Secondary | ICD-10-CM | POA: Diagnosis not present

## 2022-07-17 DIAGNOSIS — K219 Gastro-esophageal reflux disease without esophagitis: Secondary | ICD-10-CM | POA: Insufficient documentation

## 2022-07-17 DIAGNOSIS — Z1211 Encounter for screening for malignant neoplasm of colon: Secondary | ICD-10-CM

## 2022-07-17 DIAGNOSIS — F1010 Alcohol abuse, uncomplicated: Secondary | ICD-10-CM | POA: Diagnosis not present

## 2022-07-17 DIAGNOSIS — F32A Depression, unspecified: Secondary | ICD-10-CM

## 2022-07-17 MED ORDER — OMEPRAZOLE 40 MG PO CPDR: DELAYED_RELEASE_CAPSULE | ORAL | 3 refills | Status: DC

## 2022-07-17 MED ORDER — CETIRIZINE HCL 10 MG PO TABS: 10.0000 mg | ORAL_TABLET | Freq: Every day | ORAL | 3 refills | Status: DC

## 2022-07-17 NOTE — Patient Instructions (Addendum)
Welcome to [facility]! It was a pleasure meeting you today.  As discussed, Please schedule a 2 week follow-up visit.   Refills sent as requested.   Cologuard order sent for colon cancer screening.   Referral to exercise program - should receive phone call.   PLEASE NOTE:  If you had any LAB tests please let us know if you have not heard back within a few days. You may see your results on MyChart before we have a chance to review them but we will give you a call once they are reviewed by [place]. If we ordered any REFERRALS today, please let us know if you have not heard from their office within the next two weeks. Let us know through MyChart if you are needing REFILLS, or have your pharmacy send [place] the request. You can also use MyChart to communicate with me or any office staff.  Please try these tips to maintain a healthy lifestyle:  Eat most of your calories during the day when you are active. Eliminate processed foods including packaged sweets (pies, cakes, cookies), reduce intake of potatoes, white bread, white pasta, and white rice. Look for whole grain options, oat flour or almond flour.  Each meal should contain half fruits/vegetables, one quarter protein, and one quarter carbs (no bigger than a computer mouse).  Cut down on sweet beverages. This includes juice, soda, and sweet tea. Also watch fruit intake, though this is a healthier sweet option, it still contains natural sugar! Limit to 3 servings daily.  Drink at least 1 glass of water with each meal and aim for at least 8 glasses (64 ounces) per day.  Exercise at least 150 minutes every week to the best of your ability.    Take Care,  Raenah Murley, PA-C     If you develop suicidal thoughts, please tell someone and immediately proceed to our local 24/7 crisis center, [facility]. [address] [place], [place]  [phone].  Call [phone].

## 2022-07-17 NOTE — Progress Notes (Unsigned)
Subjective:    Patient ID: Tonya Perry, female    DOB: March 08, 1976, 47 y.o.   MRN: [id number]  Chief Complaint  Patient presents with   New Patient (Initial Visit)    New patient in the office to establish care with PCP; previous PCP [name] and not seen in years; pt due CPE and fasting labs; pt admits she has alcohol issues; and acknowledges this has caused weight problem; pt sees psychologist regularly; Pap results has been requested from [facility]    HPI 47 y.o. patient presents today for new patient establishment with me.  Patient was previously established with Dr. [name]. Lives with 47 yo daughter.   Current Care Team: Dr. Tamela Oddi - psychiatry  [facility] - psychology - every other week GYN - Dr. Chestine Spore   Acute Concerns: -Low back pain - left lower, going down back of left leg; sees [facility]; cortisone shots. -Very concerned about her weight, would like to look at weight loss options.  -Alcohol abuse since about 2019 - taking naltrexone, pt states not really helping; started around time her husband left    Chronic Concerns: See PMH listed below, as well as A/P for details on issues we specifically discussed during today's visit.      Past Medical History:  Diagnosis Date   Alcohol abuse    Anxiety    Arthritis    Asthma    Asthma due to seasonal allergies    Depression    Depression    Fibromyalgia    History of chlamydia 09/06/2017   History of trichomoniasis 09/06/2017   + RNA test, neg wet prep in office POCT   Hypertension    Iron deficiency anemia    Kienbck's disease    LEFT WRIST   Left wrist pain    retained deep implant   Patella alta     Past Surgical History:  Procedure Laterality Date   APPENDECTOMY     LAPAROSCOPIC APPENDECTOMY N/A 11/04/2013   Procedure: APPENDECTOMY LAPAROSCOPIC;  Surgeon: Valarie Merino, MD;  Location: [facility];  Service: General;  Laterality: N/A;   REMOVAL OF IMPLANT Left 06/13/2015    Procedure: LEFT WRIST DEEP IMPLANT REMOVAL ;  Surgeon: Bradly Bienenstock, MD;  Location: [facility];  Service: Orthopedics;  Laterality: Left;   WRIST RECONSTRUCTION Left 04-15-2015    for Kienbock's disease     Family History  Problem Relation Age of Onset   Depression Mother    Diabetes Mother    Mental illness Mother    Anxiety disorder Mother    COPD Father    Mental illness Father    Hypertension Father    Hyperlipidemia Father    Mental illness Sister    Mental illness Sister    Depression Maternal Grandmother    Mental illness Maternal Grandmother    Diabetes Maternal Grandfather    Mental illness Maternal Grandfather    Cancer Paternal Grandfather    Mental illness Paternal Grandfather    Sleep apnea Neg Hx     Social History   Tobacco Use   Smoking status: Former    Years: 5    Types: Cigarettes    Quit date: 04/06/1996    Years since quitting: 26.3   Smokeless tobacco: Never  Vaping Use   Vaping Use: Never used  Substance Use Topics   Alcohol use: Yes    Alcohol/week: 20.0 standard drinks of alcohol    Types: 20 Cans  of beer per week    Comment: hard seltizer   Drug use: Yes    Types: Marijuana     Allergies  Allergen Reactions   Azithromycin Diarrhea   Latex Itching   Other Itching and Other (See Comments)    Peas, Pineapple, Mango, Melons, Cantelope, Watermelons.  Dust mites, trees, grass and pollen.  Causes throat to swell with itching. Tree nuts, causes throat to swell with itching.   Shellfish Allergy Itching and Swelling   Voltaren [Diclofenac Sodium] Other (See Comments)    Upset stomach; made pt feel like had to go to bathroom   Penicillins Rash    Review of Systems NEGATIVE UNLESS OTHERWISE INDICATED IN HPI      Objective:     BP 116/76 (BP Location: Left Arm)   Pulse 90   Temp 97.8 F (36.6 C) (Temporal)   Ht 5\' 4"  (1.626 m)   Wt 263 lb 9.6 oz (119.6 kg)   SpO2 97%   BMI 45.25 kg/m   Wt Readings from Last 3  Encounters:  07/17/22 263 lb 9.6 oz (119.6 kg)  03/23/22 255 lb 6.4 oz (115.8 kg)  03/20/21 249 lb (112.9 kg)    BP Readings from Last 3 Encounters:  07/17/22 116/76  03/23/22 127/73  03/11/22 125/85     Physical Exam Vitals and nursing note reviewed.  Constitutional:      Appearance: Normal appearance. She is obese. She is not toxic-appearing.  HENT:     Head: Normocephalic and atraumatic.     Right Ear: External ear normal.     Left Ear: External ear normal.  Eyes:     Extraocular Movements: Extraocular movements intact.     Conjunctiva/sclera: Conjunctivae normal.     Pupils: Pupils are equal, round, and reactive to light.  Cardiovascular:     Rate and Rhythm: Normal rate and regular rhythm.     Pulses: Normal pulses.     Heart sounds: Normal heart sounds.  Pulmonary:     Effort: Pulmonary effort is normal.     Breath sounds: Normal breath sounds.  Musculoskeletal:        General: Normal range of motion.     Cervical back: Normal range of motion and neck supple.  Skin:    General: Skin is warm and dry.  Neurological:     General: No focal deficit present.     Mental Status: She is alert and oriented to person, place, and time.  Psychiatric:        Mood and Affect: Mood is depressed. Affect is tearful.        Assessment & Plan:  Anxiety and depression Assessment & Plan: Working with psychiatry Currently taking Trazodone 100 mg qhs Sertraline 100 mg qd Lamictal 25 mg QID   Flowsheet Row Office Visit from 07/17/2022 in [facility]  PHQ-9 Total Score 19      Knows about 988 and [facility] No suicidal plans or intent, promises to f/up with counselor on Monday    Dermatographism Assessment & Plan: Refilled Cetirizine 10 mg qd    Alcohol abuse Assessment & Plan: Contributing to overall decline in health Naltrexone 50 mg daily not helping per pt Cont to work with psych & counseling    Gastroesophageal reflux disease,  unspecified whether esophagitis present Assessment & Plan: Refilled Omeprazole 40 mg daily Work on wt loss and diet changes    Morbid obesity -     Amb Referral To Provider Referral Exercise Program (  P.R.E.P)  Screening for colon cancer -     Cologuard  Other orders -     Omeprazole; omeprazole 40 mg capsule,delayed release  TAKE 1 CAP BY MOUTH 30 MINUTES BEFORE A MEAL  Dispense: 90 capsule; Refill: 3 -     Cetirizine HCl; Take 1 tablet (10 mg total) by mouth at bedtime.  Dispense: 90 tablet; Refill: 3        Return in about 2 weeks (around 07/31/2022) for CPE, fasting labs .      Takao Lizer M Reed Eifert, PA-C

## 2022-07-19 NOTE — Assessment & Plan Note (Signed)
Contributing to overall decline in health Naltrexone 50 mg daily not helping per pt Cont to work with psych & counseling

## 2022-07-19 NOTE — Assessment & Plan Note (Signed)
Refilled Cetirizine 10 mg qd

## 2022-07-19 NOTE — Assessment & Plan Note (Signed)
Refilled Omeprazole 40 mg daily Work on wt loss and diet changes

## 2022-07-19 NOTE — Assessment & Plan Note (Signed)
Working with psychiatry Currently taking Trazodone 100 mg qhs Sertraline 100 mg qd Lamictal 25 mg QID   Flowsheet Row Office Visit from 07/17/2022 in [facility]  PHQ-9 Total Score 19      Knows about 988 and [facility] No suicidal plans or intent, promises to f/up with counselor on Monday

## 2022-07-20 ENCOUNTER — Encounter: Payer: Self-pay | Admitting: Physician Assistant

## 2022-08-11 ENCOUNTER — Encounter: Payer: Medicare Other | Admitting: Physician Assistant

## 2022-08-25 ENCOUNTER — Encounter: Payer: Medicare Other | Admitting: Physician Assistant

## 2022-09-07 ENCOUNTER — Encounter: Payer: PRIVATE HEALTH INSURANCE | Admitting: Physician Assistant

## 2022-09-12 ENCOUNTER — Ambulatory Visit
Admission: EM | Admit: 2022-09-12 | Discharge: 2022-09-12 | Disposition: A | Discharge status: Home / Self Care | Payer: BC Managed Care – PPO | Attending: Urgent Care | Admitting: Urgent Care

## 2022-09-12 DIAGNOSIS — R519 Headache, unspecified: Secondary | ICD-10-CM | POA: Diagnosis not present

## 2022-09-12 DIAGNOSIS — J069 Acute upper respiratory infection, unspecified: Secondary | ICD-10-CM | POA: Diagnosis not present

## 2022-09-12 DIAGNOSIS — Z1152 Encounter for screening for COVID-19: Secondary | ICD-10-CM | POA: Diagnosis not present

## 2022-09-12 DIAGNOSIS — J309 Allergic rhinitis, unspecified: Secondary | ICD-10-CM | POA: Insufficient documentation

## 2022-09-12 MED ORDER — PSEUDOEPHEDRINE HCL 60 MG PO TABS: 60.0000 mg | ORAL_TABLET | Freq: Three times a day (TID) | ORAL | 0 refills | Status: DC | PRN

## 2022-09-12 MED ORDER — CETIRIZINE HCL 10 MG PO TABS: 10.0000 mg | ORAL_TABLET | Freq: Every day | ORAL | 0 refills | Status: DC

## 2022-09-12 MED ORDER — EXCEDRIN MIGRAINE 250-250-65 MG PO TABS: 1.0000 | ORAL_TABLET | Freq: Four times a day (QID) | ORAL | 0 refills | Status: DC | PRN

## 2022-09-12 NOTE — ED Provider Notes (Signed)
[facility]  Note:  This document was prepared using Conservation officer, historic buildings and may include unintentional dictation errors.  MRN: [id number] DOB: 02-21-1976  Subjective:   Tonya Perry is a 47 y.o. female presenting for 2-day history of malaise, fatigue, sinus pressure, generalized frontal temporal headache.  Patient is presenting with her daughter who has been sick the better part of 4 to 7 days.  Believes she has the same illness.  The patient herself also has allergic rhinitis and asthma.  Does not feel that these are currently an issue for her.  No shortness of breath, wheezing, chest pain, body pains, fever.  She does admit that she drinks alcohol heavily.  Has not had a drink of alcohol in the past 5 days.  She is also not hydrating very well.  No neck pain, neck stiffness, confusion, vision changes.  No history of stroke or neurologic disorders, seizures.  No current facility-administered medications for this encounter.  Current Outpatient Medications:    acetaminophen (TYLENOL) 500 MG tablet, Take 500 mg by mouth., Disp: , Rfl:    albuterol (PROAIR HFA) 108 (90 Base) MCG/ACT inhaler, Inhale 2 puffs into the lungs as needed for wheezing (2 puffs as needed every 4-6h as needed for cough/wheeze)., Disp: 1 Inhaler, Rfl: 5   cetirizine (ZYRTEC ALLERGY) 10 MG tablet, Take 1 tablet (10 mg total) by mouth at bedtime., Disp: 90 tablet, Rfl: 3   EPINEPHrine (EPIPEN 2-PAK) 0.3 mg/0.3 mL IJ SOAJ injection, Inject 0.3 mLs (0.3 mg total) into the skin as needed (systemic reaction to seafood allergy). (Patient not taking: Reported on 07/17/2022), Disp: 1 Device, Rfl: 1   fluticasone (FLONASE) 50 MCG/ACT nasal spray, Place 1 spray into both nostrils daily. Begin by using 2 sprays in each nare daily for 3 to 5 days, then decrease to 1 spray in each nare daily., Disp: 15.8 mL, Rfl: 2   gabapentin (NEURONTIN) 600 MG tablet, Take 1 tab po qam and 1 tab po qafternoon  and 2 tabs po qhs (Patient taking differently: 400 mg 3 (three) times daily. Take 1 tab po qam and 1 tab po qafternoon and 2 tabs po qhs), Disp: 360 tablet, Rfl: 1   hydrochlorothiazide (HYDRODIURIL) 25 MG tablet, Take 25 mg by mouth daily., Disp: , Rfl:    ibuprofen (ADVIL,MOTRIN) 800 MG tablet, Take 1 tablet (800 mg total) by mouth every 8 (eight) hours as needed., Disp: 20 tablet, Rfl: 0   lamoTRIgine (LAMICTAL) 25 MG tablet, Take 25 mg by mouth 4 (four) times daily., Disp: , Rfl:    levonorgestrel (MIRENA, 52 MG,) 20 MCG/DAY IUD, 1 each by Intrauterine route once., Disp: , Rfl:    Multiple Vitamins-Minerals (MULTIVITAMIN WITH MINERALS) tablet, Take 1 tablet by mouth daily., Disp: , Rfl:    naltrexone (DEPADE) 50 MG tablet, Take 50 mg by mouth daily., Disp: , Rfl:    omeprazole (PRILOSEC) 40 MG capsule, omeprazole 40 mg capsule,delayed release  TAKE 1 CAP BY MOUTH 30 MINUTES BEFORE A MEAL, Disp: 90 capsule, Rfl: 3   sertraline (ZOLOFT) 100 MG tablet, sertraline 100 mg tablet, Disp: , Rfl:    traZODone (DESYREL) 50 MG tablet, Take 100 mg by mouth at bedtime., Disp: , Rfl:    Allergies  Allergen Reactions   Pineapple Other (See Comments)    Causes throat to swell with itching.   Shellfish Allergy Itching and Swelling   Penicillins Rash and Other (See Comments)   Azithromycin Diarrhea  Latex Itching   Other Itching and Other (See Comments)    Peas, Pineapple, Mango, Melons, Cantelope, Watermelons.  Dust mites, trees, grass and pollen.  Causes throat to swell with itching. Tree nuts, causes throat to swell with itching.   Voltaren [Diclofenac Sodium] Other (See Comments)    Upset stomach; made pt feel like had to go to bathroom    Past Medical History:  Diagnosis Date   Alcohol abuse    Anxiety    Arthritis    Asthma    Asthma due to seasonal allergies    Depression    Depression    Fibromyalgia    History of chlamydia 09/06/2017   History of trichomoniasis 09/06/2017   + RNA  test, neg wet prep in office POCT   Hypertension    Iron deficiency anemia    Kienbck's disease    LEFT WRIST   Left wrist pain    retained deep implant   Patella alta      Past Surgical History:  Procedure Laterality Date   APPENDECTOMY     LAPAROSCOPIC APPENDECTOMY N/A 11/04/2013   Procedure: APPENDECTOMY LAPAROSCOPIC;  Surgeon: Valarie Merino, MD;  Location: [facility];  Service: General;  Laterality: N/A;   REMOVAL OF IMPLANT Left 06/13/2015   Procedure: LEFT WRIST DEEP IMPLANT REMOVAL ;  Surgeon: Bradly Bienenstock, MD;  Location: [facility];  Service: Orthopedics;  Laterality: Left;   WRIST RECONSTRUCTION Left 04-15-2015    for Kienbock's disease     Family History  Problem Relation Age of Onset   Depression Mother    Diabetes Mother    Mental illness Mother    Anxiety disorder Mother    COPD Father    Mental illness Father    Hypertension Father    Hyperlipidemia Father    Mental illness Sister    Mental illness Sister    Depression Maternal Grandmother    Mental illness Maternal Grandmother    Diabetes Maternal Grandfather    Mental illness Maternal Grandfather    Cancer Paternal Grandfather    Mental illness Paternal Grandfather    Sleep apnea Neg Hx     Social History   Tobacco Use   Smoking status: Former    Years: 5    Types: Cigarettes    Quit date: 04/06/1996    Years since quitting: 26.4   Smokeless tobacco: Never  Vaping Use   Vaping Use: Never used  Substance Use Topics   Alcohol use: Yes    Alcohol/week: 20.0 standard drinks of alcohol    Types: 20 Cans of beer per week    Comment: hard seltizer   Drug use: Yes    Types: Marijuana    ROS   Objective:   Vitals: BP 121/79 (BP Location: Right Arm)   Pulse 66   Temp 98.8 F (37.1 C) (Oral)   Resp 20   SpO2 97%   Physical Exam Constitutional:      General: She is not in acute distress.    Appearance: Normal appearance. She is well-developed and normal weight. She is not  ill-appearing, toxic-appearing or diaphoretic.  HENT:     Head: Normocephalic and atraumatic.     Right Ear: Tympanic membrane, ear canal and external ear normal. No drainage or tenderness. No middle ear effusion. There is no impacted cerumen. Tympanic membrane is not erythematous or bulging.     Left Ear: Tympanic membrane, ear canal and external ear normal. No drainage or tenderness.  No middle ear effusion. There is no impacted cerumen. Tympanic membrane is not erythematous or bulging.     Nose: Nose normal. No congestion or rhinorrhea.     Mouth/Throat:     Mouth: Mucous membranes are moist. No oral lesions.     Pharynx: No pharyngeal swelling, oropharyngeal exudate, posterior oropharyngeal erythema or uvula swelling.     Tonsils: No tonsillar exudate or tonsillar abscesses.  Eyes:     General: No scleral icterus.       Right eye: No discharge.        Left eye: No discharge.     Extraocular Movements: Extraocular movements intact.     Right eye: Normal extraocular motion.     Left eye: Normal extraocular motion.     Conjunctiva/sclera: Conjunctivae normal.     Pupils: Pupils are equal, round, and reactive to light. Pupils are equal.  Neck:     Meningeal: Brudzinski's sign and Kernig's sign absent.  Cardiovascular:     Rate and Rhythm: Normal rate and regular rhythm.     Heart sounds: Normal heart sounds. No murmur heard.    No friction rub. No gallop.  Pulmonary:     Effort: Pulmonary effort is normal. No respiratory distress.     Breath sounds: No stridor. No wheezing, rhonchi or rales.  Chest:     Chest wall: No tenderness.  Musculoskeletal:     Cervical back: Normal range of motion and neck supple.  Lymphadenopathy:     Cervical: No cervical adenopathy.  Skin:    General: Skin is warm and dry.  Neurological:     General: No focal deficit present.     Mental Status: She is alert and oriented to person, place, and time.     Cranial Nerves: No cranial nerve deficit,  dysarthria or facial asymmetry.     Motor: No weakness or pronator drift.     Coordination: Romberg sign negative. Coordination normal. Finger-Nose-Finger Test and Heel to [facility] Test normal. Rapid alternating movements normal.     Gait: Gait and tandem walk normal.     Deep Tendon Reflexes: Reflexes normal.  Psychiatric:        Mood and Affect: Mood normal.        Behavior: Behavior normal.        Thought Content: Thought content normal.        Judgment: Judgment normal.     Assessment and Plan :   PDMP not reviewed this encounter.  1. Viral upper respiratory illness   2. Generalized headaches   3. Allergic rhinitis, unspecified seasonality, unspecified trigger    No signs of an acute encephalopathy.  Suspect that viral upper respiratory illness, an acute viral syndrome.  Patient requested a COVID test.  Deferred imaging given clear cardiopulmonary exam, hemodynamically stable vital signs.  Recommend supportive care.  I do not suspect alcohol withdrawal.  However, I did explain that this is a contributor of her headaches together with not hydrating well in the viral illness overall.  Counseled patient on potential for adverse effects with medications prescribed/recommended today, ER and return-to-clinic precautions discussed, patient verbalized understanding.    Wallis Bamberg, [place] 09/12/22 1137

## 2022-09-12 NOTE — ED Triage Notes (Signed)
Pt reports headache, :Im having viral symptoms, not diarrhea" x 2 days. Tylenol gives no relief.

## 2022-09-13 LAB — SARS CORONAVIRUS 2 (TAT 6-24 HRS): SARS Coronavirus 2: NEGATIVE

## 2022-10-27 ENCOUNTER — Encounter: Payer: PRIVATE HEALTH INSURANCE | Admitting: Physician Assistant

## 2022-11-04 ENCOUNTER — Encounter (INDEPENDENT_AMBULATORY_CARE_PROVIDER_SITE_OTHER): Payer: Self-pay

## 2022-11-19 ENCOUNTER — Encounter: Payer: Self-pay | Admitting: Physician Assistant

## 2022-11-19 ENCOUNTER — Ambulatory Visit: Payer: BC Managed Care – PPO | Admitting: Physician Assistant

## 2022-11-19 VITALS — BP 110/74 | HR 74 | Temp 97.7°F | Ht 64.0 in | Wt 271.8 lb

## 2022-11-19 DIAGNOSIS — Z13220 Encounter for screening for lipoid disorders: Secondary | ICD-10-CM | POA: Diagnosis not present

## 2022-11-19 DIAGNOSIS — B353 Tinea pedis: Secondary | ICD-10-CM | POA: Insufficient documentation

## 2022-11-19 DIAGNOSIS — Z91018 Allergy to other foods: Secondary | ICD-10-CM

## 2022-11-19 DIAGNOSIS — Z6841 Body Mass Index (BMI) 40.0 and over, adult: Secondary | ICD-10-CM | POA: Diagnosis not present

## 2022-11-19 DIAGNOSIS — Z23 Encounter for immunization: Secondary | ICD-10-CM | POA: Diagnosis not present

## 2022-11-19 DIAGNOSIS — R600 Localized edema: Secondary | ICD-10-CM

## 2022-11-19 DIAGNOSIS — F1010 Alcohol abuse, uncomplicated: Secondary | ICD-10-CM | POA: Diagnosis not present

## 2022-11-19 DIAGNOSIS — Z0000 Encounter for general adult medical examination without abnormal findings: Secondary | ICD-10-CM

## 2022-11-19 LAB — COMPREHENSIVE METABOLIC PANEL
ALT: 21 U/L (ref 0–35)
AST: 21 U/L (ref 0–37)
Albumin: 4.2 g/dL (ref 3.5–5.2)
Alkaline Phosphatase: 85 U/L (ref 39–117)
BUN: 9 mg/dL (ref 6–23)
CO2: 30 mEq/L (ref 19–32)
Calcium: 9.4 mg/dL (ref 8.4–10.5)
Chloride: 97 mEq/L (ref 96–112)
Creatinine, Ser: 0.86 mg/dL (ref 0.40–1.20)
GFR: 80.52 mL/min (ref >60.00)
Glucose, Bld: 92 mg/dL (ref 70–99)
Potassium: 3.6 mEq/L (ref 3.5–5.1)
Sodium: 135 mEq/L (ref 135–145)
Total Bilirubin: 0.4 mg/dL (ref 0.2–1.2)
Total Protein: 7.4 g/dL (ref 6.0–8.3)

## 2022-11-19 LAB — CBC WITH DIFFERENTIAL/PLATELET
Basophils Absolute: 0.1 10*3/uL (ref 0.0–0.1)
Basophils Relative: 0.7 % (ref 0.0–3.0)
Eosinophils Absolute: 0.1 10*3/uL (ref 0.0–0.7)
Eosinophils Relative: 1.3 % (ref 0.0–5.0)
HCT: 38.3 % (ref 36.0–46.0)
Hemoglobin: 12.5 g/dL (ref 12.0–15.0)
Lymphocytes Relative: 21.1 % (ref 12.0–46.0)
Lymphs Abs: 2.1 10*3/uL (ref 0.7–4.0)
MCHC: 32.6 g/dL (ref 30.0–36.0)
MCV: 89.4 fl (ref 78.0–100.0)
Monocytes Absolute: 0.5 10*3/uL (ref 0.1–1.0)
Monocytes Relative: 5.4 % (ref 3.0–12.0)
Neutro Abs: 7 10*3/uL (ref 1.4–7.7)
Neutrophils Relative %: 71.5 % (ref 43.0–77.0)
Platelets: 464 10*3/uL — ABNORMAL HIGH (ref 150.0–400.0)
RBC: 4.28 Mil/uL (ref 3.87–5.11)
RDW: 14.2 % (ref 11.5–15.5)
WBC: 9.8 10*3/uL (ref 4.0–10.5)

## 2022-11-19 LAB — LIPID PANEL
Cholesterol: 182 mg/dL (ref 0–200)
HDL: 52.1 mg/dL (ref >39.00)
NonHDL: 129.94
Total CHOL/HDL Ratio: 3
Triglycerides: 271 mg/dL — ABNORMAL HIGH (ref 0.0–149.0)
VLDL: 54.2 mg/dL — ABNORMAL HIGH (ref 0.0–40.0)

## 2022-11-19 LAB — LDL CHOLESTEROL, DIRECT: Direct LDL: 120 mg/dL

## 2022-11-19 LAB — HEMOGLOBIN A1C: Hgb A1c MFr Bld: 5.7 % (ref 4.6–6.5)

## 2022-11-19 LAB — VITAMIN B12: Vitamin B-12: 267 pg/mL (ref 211–911)

## 2022-11-19 LAB — TSH: TSH: 0.86 u[IU]/mL (ref 0.35–5.50)

## 2022-11-19 MED ORDER — SEMAGLUTIDE-WEIGHT MANAGEMENT 0.25 MG/0.5ML ~~LOC~~ SOAJ: 0.2500 mg | SUBCUTANEOUS | 0 refills | Status: DC

## 2022-11-19 MED ORDER — HYDROCHLOROTHIAZIDE 25 MG PO TABS: 25.0000 mg | ORAL_TABLET | Freq: Every day | ORAL | 0 refills | Status: DC

## 2022-11-19 MED ORDER — EPINEPHRINE 0.3 MG/0.3ML IJ SOAJ: 0.3000 mg | INTRAMUSCULAR | 0 refills | Status: AC | PRN

## 2022-11-19 NOTE — Assessment & Plan Note (Signed)
Struggling with weight since about age 47 per patient. Alcohol addiction greatly contributes and she acknowledges this. She does not exercise.  She is requesting trial of Wegovy, not sure if her insurance helps with coverage. Starting dose sent today. Pt aware of risks vs benefits and possible adverse reactions.   Strong emphasis with patient that any medication for weight loss will not work to fullest ability without coexisting active nutrition and exercise changes.  She is understanding.

## 2022-11-19 NOTE — Progress Notes (Signed)
Subjective:    Patient ID: Tonya Perry, female    DOB: Dec 24, 1975, 47 y.o.   MRN: [id number]  Chief Complaint  Patient presents with   Annual Exam    Pt in office for annual CPE; pt is still concerned with weight; pt is not fasting for labs;    HPI Patient is in today for annual exam. Work - [employer]  Acute concerns: Weight - wants to try a GLP1   Health maintenance: Lifestyle/ exercise: danced as a child, no exercise now Nutrition: several allergies  Mental health: working with psychiatry  Caffeine: Sweet tea and coffee  Sleep: Wears CPAP at night, does OK  Substance use: occ marijuana / CBD gummies  ETOH: Hard Seltzers daily - Naltrexone doesn't help  Sexual activity: boyfriend, monogamous, declines STD testing today Immunizations: Tdap updated today  Colonoscopy: Cologuard at home  Pap: UTD Mammogram: UTD Skin: no concerns  Past Medical History:  Diagnosis Date   Alcohol abuse    Anxiety    Anxiety disorder 04/03/2015   Arthritis    Asthma    Asthma due to seasonal allergies    Depression    Depression    Fibromyalgia    History of chlamydia 09/06/2017   History of trichomoniasis 09/06/2017   + RNA test, neg wet prep in office POCT   Hypertension    Iron deficiency anemia    Kienbck's disease    LEFT WRIST   Left wrist pain    retained deep implant   Patella alta     Past Surgical History:  Procedure Laterality Date   APPENDECTOMY     LAPAROSCOPIC APPENDECTOMY N/A 11/04/2013   Procedure: APPENDECTOMY LAPAROSCOPIC;  Surgeon: Valarie Merino, MD;  Location: [facility];  Service: General;  Laterality: N/A;   REMOVAL OF IMPLANT Left 06/13/2015   Procedure: LEFT WRIST DEEP IMPLANT REMOVAL ;  Surgeon: Bradly Bienenstock, MD;  Location: [facility];  Service: Orthopedics;  Laterality: Left;   WRIST RECONSTRUCTION Left 04-15-2015    for Kienbock's disease     Family History  Problem Relation Age of Onset   Depression Mother     Diabetes Mother    Mental illness Mother    Anxiety disorder Mother    COPD Father    Mental illness Father    Hypertension Father    Hyperlipidemia Father    Mental illness Sister    Mental illness Sister    Depression Maternal Grandmother    Mental illness Maternal Grandmother    Diabetes Maternal Grandfather    Mental illness Maternal Grandfather    Cancer Paternal Grandfather    Mental illness Paternal Grandfather    Sleep apnea Neg Hx     Social History   Tobacco Use   Smoking status: Former    Current packs/day: 0.00    Types: Cigarettes    Start date: 04/07/1991    Quit date: 04/06/1996    Years since quitting: 26.6   Smokeless tobacco: Never  Vaping Use   Vaping status: Never Used  Substance Use Topics   Alcohol use: Yes    Alcohol/week: 20.0 standard drinks of alcohol    Types: 20 Cans of beer per week    Comment: hard seltizer   Drug use: Yes    Types: Marijuana     Allergies  Allergen Reactions   Other Itching, Other (See Comments) and Rash    Peas, Pineapple, Mango, Melons, Cantelope, Watermelons.  Dust mites, trees,  grass and pollen.  Causes throat to swell with itching.  Tree nuts, causes throat to swell with itching.  Peas, Pineapple, Mango, Melons, Cantelope, Watermelons.  Dust mites, trees, grass and pollen.  Causes throat to swell with itching.  Tree nuts, causes throat to swell with itching.  Peas, Mango, Melons, Cantelope, Watermelons.  Dust mites, trees, grass and pollen.  Causes throat to swell with itching.   Pineapple Other (See Comments)    Causes throat to swell with itching.   Shellfish Allergy Itching and Swelling   Penicillins Rash and Other (See Comments)   Azithromycin Diarrhea   Latex Itching   Voltaren [Diclofenac Sodium] Other (See Comments)    Upset stomach; made pt feel like had to go to bathroom    Review of Systems NEGATIVE UNLESS OTHERWISE INDICATED IN HPI      Objective:     BP 110/74 (BP Location: Left Arm)    Pulse 74   Temp 97.7 F (36.5 C) (Temporal)   Ht 5\' 4"  (1.626 m)   Wt 271 lb 12.8 oz (123.3 kg)   SpO2 97%   BMI 46.65 kg/m   Wt Readings from Last 3 Encounters:  11/19/22 271 lb 12.8 oz (123.3 kg)  07/17/22 263 lb 9.6 oz (119.6 kg)  03/23/22 255 lb 6.4 oz (115.8 kg)    BP Readings from Last 3 Encounters:  11/19/22 110/74  09/12/22 121/79  07/17/22 116/76     Physical Exam Vitals and nursing note reviewed.  Constitutional:      Appearance: Normal appearance. She is obese. She is not toxic-appearing.  HENT:     Head: Normocephalic and atraumatic.     Right Ear: Tympanic membrane, ear canal and external ear normal.     Left Ear: Tympanic membrane, ear canal and external ear normal.     Nose: Nose normal.     Mouth/Throat:     Mouth: Mucous membranes are moist.  Eyes:     Extraocular Movements: Extraocular movements intact.     Conjunctiva/sclera: Conjunctivae normal.     Pupils: Pupils are equal, round, and reactive to light.  Cardiovascular:     Rate and Rhythm: Normal rate and regular rhythm.     Pulses: Normal pulses.     Heart sounds: Normal heart sounds.  Pulmonary:     Effort: Pulmonary effort is normal.     Breath sounds: Normal breath sounds.  Abdominal:     General: Abdomen is flat. Bowel sounds are normal.     Palpations: Abdomen is soft.  Musculoskeletal:        General: Normal range of motion.     Cervical back: Normal range of motion and neck supple.  Skin:    General: Skin is warm and dry.  Neurological:     General: No focal deficit present.     Mental Status: She is alert and oriented to person, place, and time.  Psychiatric:        Mood and Affect: Mood normal.        Behavior: Behavior normal.        Thought Content: Thought content normal.        Judgment: Judgment normal.        Assessment & Plan:  Encounter for annual physical exam -     Lipid panel -     Comprehensive metabolic panel -     CBC with Differential/Platelet -      Hemoglobin A1c -     TSH -  Vitamin B12 -     Vitamin B1  Immunization due -     Tdap vaccine greater than or equal to 7yo IM  Morbid obesity (HCC) Assessment & Plan: Struggling with weight since about age 24 per patient. Alcohol addiction greatly contributes and she acknowledges this. She does not exercise.  She is requesting trial of Wegovy, not sure if her insurance helps with coverage. Starting dose sent today. Pt aware of risks vs benefits and possible adverse reactions.   Strong emphasis with patient that any medication for weight loss will not work to fullest ability without coexisting active nutrition and exercise changes.  She is understanding.   Orders: -     Semaglutide-Weight Management; Inject 0.25 mg into the skin once a week.  Dispense: 2 mL; Refill: 0 -     Lipid panel -     Comprehensive metabolic panel -     CBC with Differential/Platelet -     Hemoglobin A1c -     TSH -     Vitamin B12 -     Vitamin B1  Multiple food allergies -     EPINEPHrine; Inject 0.3 mg into the muscle as needed for anaphylaxis.  Dispense: 2 each; Refill: 0  Bilateral edema of lower extremity -     hydroCHLOROthiazide; Take 1 tablet (25 mg total) by mouth daily.  Dispense: 90 tablet; Refill: 0 -     Comprehensive metabolic panel  Alcohol abuse -     Vitamin B12 -     Vitamin B1   Age-appropriate screening and counseling performed today. Will check labs and call with results. Preventive measures discussed and printed in AVS for patient.   Patient Counseling: [x]   Nutrition: Stressed importance of moderation in sodium/caffeine intake, saturated fat and cholesterol, caloric balance, sufficient intake of fresh fruits, vegetables, and fiber.  [x]   Stressed the importance of regular exercise.   [x]   Substance Abuse: Discussed cessation/primary prevention of tobacco, alcohol, or other drug use; driving or other dangerous activities under the influence; availability of treatment for  abuse.   []   Injury prevention: Discussed safety belts, safety helmets, smoke detector, smoking near bedding or upholstery.   [x]   Sexuality: Discussed sexually transmitted diseases, partner selection, use of condoms, avoidance of unintended pregnancy  and contraceptive alternatives.   [x]   Dental health: Discussed importance of regular tooth brushing, flossing, and dental visits.  [x]   Health maintenance and immunizations reviewed. Please refer to Health maintenance section.       Return in about 4 months (around 03/21/2023) for recheck/follow-up.    Salimatou Simone M Brookelle Pellicane, PA-C

## 2022-11-23 LAB — VITAMIN B1: Vitamin B1 (Thiamine): 16 nmol/L (ref 8–30)

## 2022-11-25 ENCOUNTER — Encounter: Payer: Self-pay | Admitting: Obstetrics

## 2022-12-04 ENCOUNTER — Ambulatory Visit
Admission: RE | Admit: 2022-12-04 | Discharge: 2022-12-04 | Disposition: A | Discharge status: Home / Self Care | Payer: BC Managed Care – PPO | Source: Ambulatory Visit

## 2022-12-04 ENCOUNTER — Ambulatory Visit (INDEPENDENT_AMBULATORY_CARE_PROVIDER_SITE_OTHER): Payer: BC Managed Care – PPO

## 2022-12-04 ENCOUNTER — Other Ambulatory Visit: Payer: Self-pay | Admitting: Urgent Care

## 2022-12-04 VITALS — BP 128/84 | HR 80 | Temp 99.2°F | Resp 18

## 2022-12-04 DIAGNOSIS — R609 Edema, unspecified: Secondary | ICD-10-CM

## 2022-12-04 DIAGNOSIS — F1010 Alcohol abuse, uncomplicated: Secondary | ICD-10-CM | POA: Diagnosis not present

## 2022-12-04 MED ORDER — CHLORDIAZEPOXIDE HCL 25 MG PO CAPS: ORAL_CAPSULE | ORAL | 0 refills | Status: DC

## 2022-12-04 MED ORDER — THIAMINE HCL 100 MG PO TABS: 100.0000 mg | ORAL_TABLET | Freq: Every day | ORAL | 0 refills | Status: DC

## 2022-12-04 NOTE — ED Provider Notes (Signed)
[facility]  Note:  This document was prepared using Conservation officer, historic buildings and may include unintentional dictation errors.  MRN: [id number] DOB: 23-Feb-1976  Subjective:   Tonya Perry is a 47 y.o. female presenting for 6-day history of intermittent headaches, whole body swelling, fatigue, diarrhea.  Patient is concerned that this is related to her alcohol use.  Reports that she intermittently drinks very heavily.  Has worked on trying to quit but has been unsuccessful.  She has previously been recommended alcohol outpatient rehab facilities but has never gone.  Denies chest pain, shortness of breath, hematuria, abdominal pain.  Patient reports that she does take hydrochlorothiazide intermittently for swelling.  She does have a medically charted diagnosis of hypertension but states it is more for swelling.  In an effort to help with her swelling, yesterday she took 100 mg of hydrochlorothiazide.  Otherwise, she normally takes 25 mg only.  No current facility-administered medications for this encounter.  Current Outpatient Medications:    acetaminophen (TYLENOL) 500 MG tablet, Take 500 mg by mouth., Disp: , Rfl:    albuterol (PROAIR HFA) 108 (90 Base) MCG/ACT inhaler, Inhale 2 puffs into the lungs as needed for wheezing (2 puffs as needed every 4-6h as needed for cough/wheeze)., Disp: 1 Inhaler, Rfl: 5   cetirizine (ZYRTEC ALLERGY) 10 MG tablet, Take 1 tablet (10 mg total) by mouth daily., Disp: 30 tablet, Rfl: 0   EPINEPHrine 0.3 mg/0.3 mL IJ SOAJ injection, Inject 0.3 mg into the muscle as needed for anaphylaxis., Disp: 2 each, Rfl: 0   fluticasone (FLONASE) 50 MCG/ACT nasal spray, Place 1 spray into both nostrils daily. Begin by using 2 sprays in each nare daily for 3 to 5 days, then decrease to 1 spray in each nare daily., Disp: 15.8 mL, Rfl: 2   gabapentin (NEURONTIN) 600 MG tablet, Take 1 tab po qam and 1 tab po qafternoon and 2 tabs po qhs (Patient  taking differently: 400 mg 3 (three) times daily. Take 1 tab po qam and 1 tab po qafternoon and 2 tabs po qhs), Disp: 360 tablet, Rfl: 1   hydrochlorothiazide (HYDRODIURIL) 25 MG tablet, Take 1 tablet (25 mg total) by mouth daily., Disp: 90 tablet, Rfl: 0   ibuprofen (ADVIL,MOTRIN) 800 MG tablet, Take 1 tablet (800 mg total) by mouth every 8 (eight) hours as needed., Disp: 20 tablet, Rfl: 0   lamoTRIgine (LAMICTAL) 25 MG tablet, Take 25 mg by mouth 4 (four) times daily., Disp: , Rfl:    levonorgestrel (MIRENA, 52 MG,) 20 MCG/DAY IUD, 1 each by Intrauterine route once., Disp: , Rfl:    Multiple Vitamins-Minerals (MULTIVITAMIN WITH MINERALS) tablet, Take 1 tablet by mouth daily., Disp: , Rfl:    naltrexone (DEPADE) 50 MG tablet, Take 50 mg by mouth daily., Disp: , Rfl:    omeprazole (PRILOSEC) 40 MG capsule, omeprazole 40 mg capsule,delayed release  TAKE 1 CAP BY MOUTH 30 MINUTES BEFORE A MEAL, Disp: 90 capsule, Rfl: 3   Semaglutide-Weight Management 0.25 MG/0.5ML SOAJ, Inject 0.25 mg into the skin once a week., Disp: 2 mL, Rfl: 0   sertraline (ZOLOFT) 100 MG tablet, sertraline 100 mg tablet, Disp: , Rfl:    traZODone (DESYREL) 50 MG tablet, Take 100 mg by mouth at bedtime., Disp: , Rfl:    Allergies  Allergen Reactions   Other Itching, Other (See Comments) and Rash    Peas, Pineapple, Mango, Melons, Cantelope, Watermelons.  Dust mites, trees, grass and pollen.  Causes throat to swell with itching.  Tree nuts, causes throat to swell with itching.  Peas, Pineapple, Mango, Melons, Cantelope, Watermelons.  Dust mites, trees, grass and pollen.  Causes throat to swell with itching.  Tree nuts, causes throat to swell with itching.  Peas, Mango, Melons, Cantelope, Watermelons.  Dust mites, trees, grass and pollen.  Causes throat to swell with itching.   Pineapple Other (See Comments)    Causes throat to swell with itching.   Shellfish Allergy Itching and Swelling   Penicillins Rash and Other (See  Comments)   Azithromycin Diarrhea   Latex Itching   Voltaren [Diclofenac Sodium] Other (See Comments)    Upset stomach; made pt feel like had to go to bathroom    Past Medical History:  Diagnosis Date   Alcohol abuse    Anxiety    Anxiety disorder 04/03/2015   Arthritis    Asthma    Asthma due to seasonal allergies    Depression    Depression    Fibromyalgia    History of chlamydia 09/06/2017   History of trichomoniasis 09/06/2017   + RNA test, neg wet prep in office POCT   Hypertension    Iron deficiency anemia    Kienbck's disease    LEFT WRIST   Left wrist pain    retained deep implant   Patella alta      Past Surgical History:  Procedure Laterality Date   APPENDECTOMY     LAPAROSCOPIC APPENDECTOMY N/A 11/04/2013   Procedure: APPENDECTOMY LAPAROSCOPIC;  Surgeon: Valarie Merino, MD;  Location: [facility];  Service: General;  Laterality: N/A;   REMOVAL OF IMPLANT Left 06/13/2015   Procedure: LEFT WRIST DEEP IMPLANT REMOVAL ;  Surgeon: Bradly Bienenstock, MD;  Location: [facility] [place];  Service: Orthopedics;  Laterality: Left;   WRIST RECONSTRUCTION Left 04-15-2015    for Kienbock's disease     Family History  Problem Relation Age of Onset   Depression Mother    Diabetes Mother    Mental illness Mother    Anxiety disorder Mother    COPD Father    Mental illness Father    Hypertension Father    Hyperlipidemia Father    Mental illness Sister    Mental illness Sister    Depression Maternal Grandmother    Mental illness Maternal Grandmother    Diabetes Maternal Grandfather    Mental illness Maternal Grandfather    Cancer Paternal Grandfather    Mental illness Paternal Grandfather    Sleep apnea Neg Hx     Social History   Tobacco Use   Smoking status: Former    Current packs/day: 0.00    Types: Cigarettes    Start date: 04/07/1991    Quit date: 04/06/1996    Years since quitting: 26.6   Smokeless tobacco: Never  Vaping Use   Vaping status: Never Used   Substance Use Topics   Alcohol use: Yes    Alcohol/week: 20.0 standard drinks of alcohol    Types: 20 Cans of beer per week    Comment: hard seltzer   Drug use: Yes    Types: Marijuana    ROS   Objective:   Vitals: BP 128/84 (BP Location: Left Arm)   Pulse 80   Temp 99.2 F (37.3 C) (Oral)   Resp 18   SpO2 97%   Physical Exam Constitutional:      General: She is not in acute distress.    Appearance: Normal appearance. She is  well-developed and normal weight. She is not ill-appearing, toxic-appearing or diaphoretic.  HENT:     Head: Normocephalic and atraumatic.     Right Ear: Tympanic membrane, ear canal and external ear normal. No drainage or tenderness. No middle ear effusion. There is no impacted cerumen. Tympanic membrane is not erythematous or bulging.     Left Ear: Tympanic membrane, ear canal and external ear normal. No drainage or tenderness.  No middle ear effusion. There is no impacted cerumen. Tympanic membrane is not erythematous or bulging.     Nose: Nose normal. No congestion or rhinorrhea.     Mouth/Throat:     Mouth: Mucous membranes are moist. No oral lesions.     Pharynx: No pharyngeal swelling, oropharyngeal exudate, posterior oropharyngeal erythema or uvula swelling.     Tonsils: No tonsillar exudate or tonsillar abscesses.  Eyes:     General: No scleral icterus.       Right eye: No discharge.        Left eye: No discharge.     Extraocular Movements: Extraocular movements intact.     Right eye: Normal extraocular motion.     Left eye: Normal extraocular motion.     Conjunctiva/sclera: Conjunctivae normal.  Cardiovascular:     Rate and Rhythm: Normal rate and regular rhythm.     Heart sounds: Normal heart sounds. No murmur heard.    No friction rub. No gallop.  Pulmonary:     Effort: Pulmonary effort is normal. No respiratory distress.     Breath sounds: No stridor. No wheezing, rhonchi or rales.  Chest:     Chest wall: No tenderness.   Abdominal:     General: Bowel sounds are normal. There is no distension.     Palpations: Abdomen is soft. There is no mass.     Tenderness: There is no abdominal tenderness. There is no right CVA tenderness, left CVA tenderness, guarding or rebound.  Musculoskeletal:     Cervical back: Normal range of motion and neck supple.  Lymphadenopathy:     Cervical: No cervical adenopathy.  Skin:    General: Skin is warm and dry.  Neurological:     General: No focal deficit present.     Mental Status: She is alert and oriented to person, place, and time.     Cranial Nerves: No cranial nerve deficit.     Motor: No weakness.     Coordination: Coordination normal.     Gait: Gait normal.     Deep Tendon Reflexes: Reflexes normal.     Comments: No asterixis noted.  Psychiatric:        Mood and Affect: Mood normal.        Behavior: Behavior normal.        Thought Content: Thought content normal.        Judgment: Judgment normal.     DG Chest 2 View  Result Date: 12/04/2022 CLINICAL DATA:  Swelling.  Intermittent fatigue E. Diarrhea. EXAM: CHEST - 2 VIEW COMPARISON:  None Available. FINDINGS: The heart size and mediastinal contours are within normal limits. Both lungs are clear. The visualized skeletal structures are unremarkable. IMPRESSION: Negative two view chest x-ray Electronically Signed   By: Marin Roberts M.D.   On: 12/04/2022 14:59     Assessment and Plan :   PDMP not reviewed this encounter.  1. Swelling   2. Alcohol abuse    Chest x-ray negative, will pursue basic lab work.  Emphasized need to continue efforts at  quitting her alcohol use.  Recommended starting Librium today as a taper.  Follow-up with an alcohol rehab facility as soon as possible.  I will place a referral to see a behavioral therapist as well through [facility] that may be able to help her with her alcohol use.  Start thiamine supplementation.  Counseled patient on potential for adverse effects with medications  prescribed/recommended today, strict ER and return-to-clinic precautions discussed, patient verbalized understanding.    Wallis Bamberg, [place] 12/04/22 [phone]

## 2022-12-04 NOTE — Discharge Instructions (Addendum)
Please contact either the [facility] or the [facility] to help with the alcohol use.  For now, I am going to get you started with a Librium taper.  This can help you stop drinking alcohol.  However you definitely need good support and therefore encouraged her to establish care with one of the centers I mentioned.  Start taking thiamine daily.  Follow-up with your PCP, I will call you with your results and the next steps to take tomorrow.  If you develop a severe headache, severe abdominal pain, confusion, shaking, chest pain then please go to the emergency room.

## 2022-12-04 NOTE — ED Triage Notes (Signed)
Pt reports the whole body is swelling, fatigue and off and on diarrhea x 6 days. Reports she took hydrochlorothiazide 100 mg yesterday and "felt bad", usual dose is 25 mg.

## 2022-12-05 LAB — COMPREHENSIVE METABOLIC PANEL
ALT: 29 IU/L (ref 0–32)
AST: 29 IU/L (ref 0–40)
Albumin: 4.2 g/dL (ref 3.9–4.9)
Alkaline Phosphatase: 104 IU/L (ref 44–121)
BUN/Creatinine Ratio: 10 (ref 9–23)
BUN: 9 mg/dL (ref 6–24)
Bilirubin Total: 0.3 mg/dL (ref 0.0–1.2)
CO2: 27 mmol/L (ref 20–29)
Calcium: 9.9 mg/dL (ref 8.7–10.2)
Chloride: 99 mmol/L (ref 96–106)
Creatinine, Ser: 0.88 mg/dL (ref 0.57–1.00)
Globulin, Total: 2.9 g/dL (ref 1.5–4.5)
Glucose: 88 mg/dL (ref 70–99)
Potassium: 4.3 mmol/L (ref 3.5–5.2)
Sodium: 139 mmol/L (ref 134–144)
Total Protein: 7.1 g/dL (ref 6.0–8.5)
eGFR: 82 mL/min/{1.73_m2} (ref >59)

## 2022-12-05 LAB — CBC
Hematocrit: 39.1 % (ref 34.0–46.6)
Hemoglobin: 12.6 g/dL (ref 11.1–15.9)
MCH: 29.6 pg (ref 26.6–33.0)
MCHC: 32.2 g/dL (ref 31.5–35.7)
MCV: 92 fL (ref 79–97)
Platelets: 450 10*3/uL (ref 150–450)
RBC: 4.25 x10E6/uL (ref 3.77–5.28)
RDW: 13.5 % (ref 11.7–15.4)
WBC: 11.3 10*3/uL — ABNORMAL HIGH (ref 3.4–10.8)

## 2022-12-10 ENCOUNTER — Telehealth: Payer: Self-pay

## 2023-01-26 ENCOUNTER — Ambulatory Visit
Admission: EM | Admit: 2023-01-26 | Discharge: 2023-01-26 | Disposition: A | Discharge status: Home / Self Care | Payer: BC Managed Care – PPO | Attending: Internal Medicine | Admitting: Internal Medicine

## 2023-01-26 DIAGNOSIS — R519 Headache, unspecified: Secondary | ICD-10-CM | POA: Diagnosis present

## 2023-01-26 DIAGNOSIS — J4530 Mild persistent asthma, uncomplicated: Secondary | ICD-10-CM | POA: Insufficient documentation

## 2023-01-26 DIAGNOSIS — Z1152 Encounter for screening for COVID-19: Secondary | ICD-10-CM | POA: Diagnosis not present

## 2023-01-26 DIAGNOSIS — B349 Viral infection, unspecified: Secondary | ICD-10-CM | POA: Diagnosis not present

## 2023-01-26 MED ORDER — PROMETHAZINE-DM 6.25-15 MG/5ML PO SYRP: 5.0000 mL | ORAL_SOLUTION | Freq: Three times a day (TID) | ORAL | 0 refills | Status: DC | PRN

## 2023-01-26 MED ORDER — PREDNISONE 20 MG PO TABS: ORAL_TABLET | ORAL | 0 refills | Status: DC

## 2023-01-26 MED ORDER — CETIRIZINE HCL 10 MG PO TABS
10.0000 mg | ORAL_TABLET | Freq: Every day | ORAL | 0 refills | Status: AC
Start: 2023-01-26 — End: ?

## 2023-01-26 MED ORDER — LEVALBUTEROL TARTRATE 45 MCG/ACT IN AERO: 1.0000 | INHALATION_SPRAY | Freq: Four times a day (QID) | RESPIRATORY_TRACT | 0 refills | Status: DC | PRN

## 2023-01-26 NOTE — ED Provider Notes (Signed)
[facility]  Note:  This document was prepared using Conservation officer, historic buildings and may include unintentional dictation errors.  MRN: [id number] DOB: [birth year]  Subjective:   Tonya Perry is a 47 y.o. female presenting for 1 day history of acute onset sinus congestion and drainage, malaise and fatigue, chest wall pain, sinus headaches.  Would like to have a COVID test.  Has a history of asthma and allergic rhinitis.  Needs a refill of her inhaler and Zyrtec.  Patient is not a smoker.  No current facility-administered medications for this encounter.  Current Outpatient Medications:    acetaminophen (TYLENOL) 500 MG tablet, Take 500 mg by mouth., Disp: , Rfl:    albuterol (PROAIR HFA) 108 (90 Base) MCG/ACT inhaler, Inhale 2 puffs into the lungs as needed for wheezing (2 puffs as needed every 4-6h as needed for cough/wheeze)., Disp: 1 Inhaler, Rfl: 5   cetirizine (ZYRTEC ALLERGY) 10 MG tablet, Take 1 tablet (10 mg total) by mouth daily., Disp: 30 tablet, Rfl: 0   chlordiazePOXIDE (LIBRIUM) 25 MG capsule, Day 1: Take 2 capsules every 6 hours. Day 2: Take 2 capsule every 8 hours. Day 3: Take 2 capsules every 12 hours. Day 4: Take 1 capsule every 12 hours. Day 5: Take 1 capsule at bedtime., Disp: 21 capsule, Rfl: 0   EPINEPHrine 0.3 mg/0.3 mL IJ SOAJ injection, Inject 0.3 mg into the muscle as needed for anaphylaxis., Disp: 2 each, Rfl: 0   fluticasone (FLONASE) 50 MCG/ACT nasal spray, Place 1 spray into both nostrils daily. Begin by using 2 sprays in each nare daily for 3 to 5 days, then decrease to 1 spray in each nare daily., Disp: 15.8 mL, Rfl: 2   gabapentin (NEURONTIN) 600 MG tablet, Take 1 tab po qam and 1 tab po qafternoon and 2 tabs po qhs (Patient taking differently: 400 mg 3 (three) times daily. Take 1 tab po qam and 1 tab po qafternoon and 2 tabs po qhs), Disp: 360 tablet, Rfl: 1   hydrochlorothiazide (HYDRODIURIL) 25 MG tablet, Take 1 tablet (25 mg  total) by mouth daily., Disp: 90 tablet, Rfl: 0   ibuprofen (ADVIL,MOTRIN) 800 MG tablet, Take 1 tablet (800 mg total) by mouth every 8 (eight) hours as needed., Disp: 20 tablet, Rfl: 0   lamoTRIgine (LAMICTAL) 25 MG tablet, Take 25 mg by mouth 4 (four) times daily., Disp: , Rfl:    levonorgestrel (MIRENA, 52 MG,) 20 MCG/DAY IUD, 1 each by Intrauterine route once., Disp: , Rfl:    Multiple Vitamins-Minerals (MULTIVITAMIN WITH MINERALS) tablet, Take 1 tablet by mouth daily., Disp: , Rfl:    naltrexone (DEPADE) 50 MG tablet, Take 50 mg by mouth daily., Disp: , Rfl:    omeprazole (PRILOSEC) 40 MG capsule, omeprazole 40 mg capsule,delayed release  TAKE 1 CAP BY MOUTH 30 MINUTES BEFORE A MEAL, Disp: 90 capsule, Rfl: 3   Semaglutide-Weight Management 0.25 MG/0.5ML SOAJ, Inject 0.25 mg into the skin once a week., Disp: 2 mL, Rfl: 0   sertraline (ZOLOFT) 100 MG tablet, sertraline 100 mg tablet, Disp: , Rfl:    thiamine (VITAMIN B1) 100 MG tablet, Take 1 tablet (100 mg total) by mouth daily., Disp: 30 tablet, Rfl: 0   traZODone (DESYREL) 50 MG tablet, Take 100 mg by mouth at bedtime., Disp: , Rfl:    Allergies  Allergen Reactions   Other Itching, Other (See Comments) and Rash    Peas, Pineapple, Mango, Melons, Cantelope, Watermelons.  Dust  mites, trees, grass and pollen.  Causes throat to swell with itching.  Tree nuts, causes throat to swell with itching.  Peas, Pineapple, Mango, Melons, Cantelope, Watermelons.  Dust mites, trees, grass and pollen.  Causes throat to swell with itching.  Tree nuts, causes throat to swell with itching.  Peas, Mango, Melons, Cantelope, Watermelons.  Dust mites, trees, grass and pollen.  Causes throat to swell with itching.   Pineapple Other (See Comments)    Causes throat to swell with itching.   Shellfish Allergy Itching and Swelling   Penicillins Rash and Other (See Comments)   Azithromycin Diarrhea   Latex Itching   Voltaren [Diclofenac Sodium] Other (See  Comments)    Upset stomach; made pt feel like had to go to bathroom    Past Medical History:  Diagnosis Date   Alcohol abuse    Anxiety    Anxiety disorder 04/03/2015   Arthritis    Asthma    Asthma due to seasonal allergies    Depression    Depression    Fibromyalgia    History of chlamydia 09/06/2017   History of trichomoniasis 09/06/2017   + RNA test, neg wet prep in office POCT   Hypertension    Iron deficiency anemia    Kienbck's disease    LEFT WRIST   Left wrist pain    retained deep implant   Patella alta      Past Surgical History:  Procedure Laterality Date   APPENDECTOMY     LAPAROSCOPIC APPENDECTOMY N/A 11/04/2013   Procedure: APPENDECTOMY LAPAROSCOPIC;  Surgeon: Valarie Merino, MD;  Location: [facility];  Service: General;  Laterality: N/A;   REMOVAL OF IMPLANT Left 06/13/2015   Procedure: LEFT WRIST DEEP IMPLANT REMOVAL ;  Surgeon: Bradly Bienenstock, MD;  Location: [facility] [place];  Service: Orthopedics;  Laterality: Left;   WRIST RECONSTRUCTION Left 04-15-2015    for Kienbock's disease     Family History  Problem Relation Age of Onset   Depression Mother    Diabetes Mother    Mental illness Mother    Anxiety disorder Mother    COPD Father    Mental illness Father    Hypertension Father    Hyperlipidemia Father    Mental illness Sister    Mental illness Sister    Depression Maternal Grandmother    Mental illness Maternal Grandmother    Diabetes Maternal Grandfather    Mental illness Maternal Grandfather    Cancer Paternal Grandfather    Mental illness Paternal Grandfather    Sleep apnea Neg Hx     Social History   Tobacco Use   Smoking status: Former    Current packs/day: 0.00    Types: Cigarettes    Start date: 04/07/1991    Quit date: 04/06/1996    Years since quitting: 26.8   Smokeless tobacco: Never  Vaping Use   Vaping status: Never Used  Substance Use Topics   Alcohol use: Yes    Alcohol/week: 20.0 standard drinks of alcohol     Types: 20 Cans of beer per week    Comment: hard seltzer   Drug use: Yes    Types: Marijuana    ROS   Objective:   Vitals: BP 116/83 (BP Location: Left Arm)   Pulse 82   Temp 98 F (36.7 C) (Oral)   Resp 16   Ht 5\' 4"  (1.626 m)   Wt 270 lb (122.5 kg)   LMP  (LMP Unknown)   SpO2  94%   BMI 46.35 kg/m   Physical Exam Constitutional:      General: She is not in acute distress.    Appearance: Normal appearance. She is well-developed and normal weight. She is not ill-appearing, toxic-appearing or diaphoretic.  HENT:     Head: Normocephalic and atraumatic.     Right Ear: Tympanic membrane, ear canal and external ear normal. No drainage or tenderness. No middle ear effusion. There is no impacted cerumen. Tympanic membrane is not erythematous or bulging.     Left Ear: Tympanic membrane, ear canal and external ear normal. No drainage or tenderness.  No middle ear effusion. There is no impacted cerumen. Tympanic membrane is not erythematous or bulging.     Nose: Nose normal. No congestion or rhinorrhea.     Mouth/Throat:     Mouth: Mucous membranes are moist. No oral lesions.     Pharynx: No pharyngeal swelling, oropharyngeal exudate, posterior oropharyngeal erythema or uvula swelling.     Tonsils: No tonsillar exudate or tonsillar abscesses.  Eyes:     General: No scleral icterus.       Right eye: No discharge.        Left eye: No discharge.     Extraocular Movements: Extraocular movements intact.     Right eye: Normal extraocular motion.     Left eye: Normal extraocular motion.     Conjunctiva/sclera: Conjunctivae normal.  Cardiovascular:     Rate and Rhythm: Normal rate and regular rhythm.     Heart sounds: Normal heart sounds. No murmur heard.    No friction rub. No gallop.  Pulmonary:     Effort: Pulmonary effort is normal. No respiratory distress.     Breath sounds: No stridor. No wheezing, rhonchi or rales.  Chest:     Chest wall: No tenderness.  Musculoskeletal:      Cervical back: Normal range of motion and neck supple.  Lymphadenopathy:     Cervical: No cervical adenopathy.  Skin:    General: Skin is warm and dry.  Neurological:     General: No focal deficit present.     Mental Status: She is alert and oriented to person, place, and time.  Psychiatric:        Mood and Affect: Mood normal.        Behavior: Behavior normal.      Assessment and Plan :   PDMP not reviewed this encounter.  1. Acute viral syndrome   2. Mild persistent asthma, uncomplicated    COVID testing pending.  Refilled lev albuterol.  Recommended an oral prednisone course given her respiratory symptoms in the context of her chronic allergic rhinitis and asthma.  Deferred imaging given clear cardiopulmonary exam, hemodynamically stable vital signs.  Counseled patient on potential for adverse effects with medications prescribed/recommended today, ER and return-to-clinic precautions discussed, patient verbalized understanding.    Wallis Bamberg, PA-C 01/29/23 1050

## 2023-01-26 NOTE — Discharge Instructions (Addendum)
We will notify you of your test results as they arrive and may take between about 24 hours.  I encourage you to sign up for MyChart if you have not already done so as this can be the easiest way for [place] to communicate results to you online or through a phone app.  Generally, we only contact you if it is a positive test result.  In the meantime, if you develop worsening symptoms including fever, chest pain, shortness of breath despite our current treatment plan then please report to the emergency room as this may be a sign of worsening status from possible viral infection.  Otherwise, we will manage this as a viral syndrome. For sore throat or cough try using a honey-based tea. Use 3 teaspoons of honey with juice squeezed from half lemon. Place shaved pieces of ginger into 1/2-1 cup of water and warm over stove top. Then mix the ingredients and repeat every 4 hours as needed. Please take Tylenol 650mg  every 6 hours for aches and pains, fevers. Hydrate very well with at least 2 liters of water. Eat light meals such as soups to replenish electrolytes and soft fruits, veggies. Start an antihistamine like Zyrtec (10mg  daily) for postnasal drainage, sinus congestion.  You can take this together with Xopenex for your breathing, respiratory symptoms. Start prednisone tomorrow to help with your respiratory and lung symptoms. Use the cough medications as needed.

## 2023-01-26 NOTE — ED Triage Notes (Signed)
Patient here today with c/o chest pain, fatigue, and headache since Sunday evening. She is also having some nasal congestion and drainage.

## 2023-01-27 LAB — SARS CORONAVIRUS 2 (TAT 6-24 HRS): SARS Coronavirus 2: NEGATIVE

## 2023-02-02 ENCOUNTER — Ambulatory Visit: Payer: Medicare Other

## 2023-02-02 ENCOUNTER — Ambulatory Visit
Admission: RE | Admit: 2023-02-02 | Discharge: 2023-02-02 | Disposition: A | Discharge status: Home / Self Care | Payer: BC Managed Care – PPO | Source: Ambulatory Visit | Attending: Internal Medicine | Admitting: Internal Medicine

## 2023-02-02 VITALS — BP 123/86 | HR 85 | Temp 98.6°F | Resp 17

## 2023-02-02 DIAGNOSIS — J4521 Mild intermittent asthma with (acute) exacerbation: Secondary | ICD-10-CM | POA: Diagnosis not present

## 2023-02-02 DIAGNOSIS — J209 Acute bronchitis, unspecified: Secondary | ICD-10-CM

## 2023-02-02 MED ORDER — FLUTICASONE PROPIONATE HFA 110 MCG/ACT IN AERO
1.0000 | INHALATION_SPRAY | Freq: Two times a day (BID) | RESPIRATORY_TRACT | 0 refills | Status: DC
Start: 2023-02-02 — End: 2023-03-22

## 2023-02-02 MED ORDER — DOXYCYCLINE HYCLATE 100 MG PO CAPS
100.0000 mg | ORAL_CAPSULE | Freq: Two times a day (BID) | ORAL | 0 refills | Status: AC
Start: 2023-02-05 — End: 2023-02-12

## 2023-02-02 NOTE — ED Provider Notes (Signed)
[facility]    CSN: [id number] Arrival date & time: 02/02/23  1428      History   Chief Complaint Chief Complaint  Patient presents with   Medication Reaction    Need a different medicine for my chest congestion. Prednisone hurts my stomach. Also have sore throat. - Entered by patient    HPI Tonya Perry is a 47 y.o. female  presents for evaluation of URI symptoms for 8-9 days. Patient reports associated symptoms of cough, congestion, sore throat, chest tightness/wheezing. Denies N/V/D, fevers, ear pain, body aches, shortness of breath. Patient does have a hx of asthma. Patient does have a previous history of smoking.  Patient was seen in urgent care on 10/22 for same symptoms.  She did had a negative COVID PCR and was prescribed prednisone, Promethazine DM, and an albuterol inhaler.  She did not pick up the albuterol inhaler due to cost purposes.  She has been taking a Promethazine DM with some improvement.  She reports she cannot tolerate the prednisone as it hurts her stomach and causes her to not be able to sleep.  Pt has taken nothing OTC for symptoms. Pt has no other concerns at this time.   HPI  Past Medical History:  Diagnosis Date   Alcohol abuse    Anxiety    Anxiety disorder 04/03/2015   Arthritis    Asthma    Asthma due to seasonal allergies    Depression    Depression    Fibromyalgia    History of chlamydia 09/06/2017   History of trichomoniasis 09/06/2017   + RNA test, neg wet prep in office POCT   Hypertension    Iron deficiency anemia    Kienbck's disease    LEFT WRIST   Left wrist pain    retained deep implant   Patella alta     Patient Active Problem List   Diagnosis Date Noted   Tinea pedis of both feet 11/19/2022   Alcohol abuse 07/17/2022   Gastroesophageal reflux disease 07/17/2022   Morbid obesity (HCC) 07/17/2022   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  11/09/2017   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   Degeneration, intervertebral disc, lumbosacral 12/28/2014   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   Fibromyalgia 10/27/2014   Asthma, mild intermittent, uncomplicated 08/22/2014   Chronic allergic conjunctivitis 08/22/2014   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   Rash and nonspecific skin eruption 08/22/2014   Anxiety and depression 07/18/2014   Anxiety 07/18/2014   Counseling on substance use and abuse 07/18/2014   Arthritis 07/18/2014   Allergic rhinitis 01/22/2014   Obesity (BMI 30-39.9) 09/14/2012   Depression 09/14/2012    Past Surgical History:  Procedure Laterality Date   APPENDECTOMY     LAPAROSCOPIC APPENDECTOMY N/A 11/04/2013   Procedure: APPENDECTOMY LAPAROSCOPIC;  Surgeon: Valarie Merino, MD;  Location: [facility];  Service: General;  Laterality: N/A;   REMOVAL OF IMPLANT Left 06/13/2015   Procedure: LEFT WRIST DEEP IMPLANT REMOVAL ;  Surgeon: Bradly Bienenstock, MD;  Location: [facility];  Service: Orthopedics;  Laterality: Left;   WRIST RECONSTRUCTION Left 04-15-2015    for Kienbock's disease     OB History   No obstetric history on file.      Home Medications    Prior to Admission medications   Medication Sig Start Date End Date Taking? Authorizing Provider  doxycycline (VIBRAMYCIN) 100 MG capsule Take 1 capsule (100 mg total) by mouth 2 (  two) times daily for 7 days. 02/05/23 02/12/23 Yes Radford Pax, NP  fluticasone (FLOVENT HFA) 110 MCG/ACT inhaler Inhale 1 puff into the lungs 2 (two) times daily. 02/02/23  Yes Radford Pax, NP  acetaminophen (TYLENOL) 500 MG tablet Take 500 mg by mouth.    [provider]  cetirizine (ZYRTEC ALLERGY) 10 MG tablet Take 1 tablet (10 mg total) by mouth daily. 01/26/23   Wallis Bamberg, PA-C  EPINEPHrine 0.3 mg/0.3 mL IJ SOAJ injection Inject 0.3 mg into the muscle as needed for anaphylaxis. 11/19/22   Allwardt, Alyssa M, PA-C  fluticasone (FLONASE) 50 MCG/ACT nasal spray Place 1 spray  into both nostrils daily. Begin by using 2 sprays in each nare daily for 3 to 5 days, then decrease to 1 spray in each nare daily. 03/11/22   Theadora Rama Scales, PA-C  gabapentin (NEURONTIN) 600 MG tablet Take 1 tab po qam and 1 tab po qafternoon and 2 tabs po qhs Patient taking differently: 400 mg 3 (three) times daily. Take 1 tab po qam and 1 tab po qafternoon and 2 tabs po qhs 07/01/17   [name], MD  hydrochlorothiazide (HYDRODIURIL) 25 MG tablet Take 1 tablet (25 mg total) by mouth daily. 11/19/22   Allwardt, Crist Infante, PA-C  ibuprofen (ADVIL,MOTRIN) 800 MG tablet Take 1 tablet (800 mg total) by mouth every 8 (eight) hours as needed. 04/27/18   Georgina Quint, MD  lamoTRIgine (LAMICTAL) 25 MG tablet Take 25 mg by mouth 4 (four) times daily. 11/09/16   Tamela Oddi, PA-C  levalbuterol [facility] HFA) 45 MCG/ACT inhaler Inhale 1-2 puffs into the lungs every 6 (six) hours as needed for wheezing. 01/26/23   Wallis Bamberg, PA-C  levonorgestrel (MIRENA, 52 MG,) 20 MCG/DAY IUD 1 each by Intrauterine route once. 05/02/18   [provider]  Multiple Vitamins-Minerals (MULTIVITAMIN WITH MINERALS) tablet Take 1 tablet by mouth daily.    [provider]  naltrexone (DEPADE) 50 MG tablet Take 50 mg by mouth daily. 01/09/20   [provider]  omeprazole (PRILOSEC) 40 MG capsule omeprazole 40 mg capsule,delayed release  TAKE 1 CAP BY MOUTH 30 MINUTES BEFORE A MEAL 07/17/22   Allwardt, Alyssa M, PA-C  predniSONE (DELTASONE) 20 MG tablet Take 2 tablets daily with breakfast. 01/26/23   Wallis Bamberg, PA-C  promethazine-dextromethorphan (PROMETHAZINE-DM) 6.25-15 MG/5ML syrup Take 5 mLs by mouth 3 (three) times daily as needed for cough. 01/26/23   Wallis Bamberg, PA-C  sertraline (ZOLOFT) 100 MG tablet sertraline 100 mg tablet    [provider]  thiamine (VITAMIN B1) 100 MG tablet Take 1 tablet (100 mg total) by mouth daily. 12/04/22   Wallis Bamberg, PA-C  traZODone (DESYREL) 50 MG tablet  Take 100 mg by mouth at bedtime.    [provider]    Family History Family History  Problem Relation Age of Onset   Depression Mother    Diabetes Mother    Mental illness Mother    Anxiety disorder Mother    COPD Father    Mental illness Father    Hypertension Father    Hyperlipidemia Father    Mental illness Sister    Mental illness Sister    Depression Maternal Grandmother    Mental illness Maternal Grandmother    Diabetes Maternal Grandfather    Mental illness Maternal Grandfather    Cancer Paternal Grandfather    Mental illness Paternal Grandfather    Sleep apnea Neg Hx     Social  History Social History   Tobacco Use   Smoking status: Former    Current packs/day: 0.00    Types: Cigarettes    Start date: 04/07/1991    Quit date: 04/06/1996    Years since quitting: 26.8   Smokeless tobacco: Never  Vaping Use   Vaping status: Never Used  Substance Use Topics   Alcohol use: Yes    Alcohol/week: 20.0 standard drinks of alcohol    Types: 20 Cans of beer per week    Comment: hard seltzer   Drug use: Yes    Types: Marijuana     Allergies   Other, Pineapple, Shellfish allergy, Penicillins, Azithromycin, Latex, and Voltaren [diclofenac sodium]   Review of Systems Review of Systems  HENT:  Positive for congestion and sore throat.   Respiratory:  Positive for cough, chest tightness and wheezing.      Physical Exam Triage Vital Signs ED Triage Vitals [02/02/23 1455]  Encounter Vitals Group     BP 123/86     Systolic BP Percentile      Diastolic BP Percentile      Pulse Rate 85     Resp 17     Temp 98.6 F (37 C)     Temp Source Oral     SpO2 96 %     Weight      Height      Head Circumference      Peak Flow      Pain Score 4     Pain Loc      Pain Education      Exclude from Growth Chart    No data found.  Updated Vital Signs BP 123/86 (BP Location: Right Arm)   Pulse 85   Temp 98.6 F (37 C) (Oral)   Resp 17   LMP  (LMP Unknown)    SpO2 96%   Visual Acuity Right Eye Distance:   Left Eye Distance:   Bilateral Distance:    Right Eye Near:   Left Eye Near:    Bilateral Near:     Physical Exam Vitals and nursing note reviewed.  Constitutional:      General: She is not in acute distress.    Appearance: She is well-developed. She is not ill-appearing.  HENT:     Head: Normocephalic and atraumatic.     Right Ear: Tympanic membrane and ear canal normal.     Left Ear: Tympanic membrane and ear canal normal.     Nose: Congestion present.     Mouth/Throat:     Mouth: Mucous membranes are moist.     Pharynx: Oropharynx is clear. Uvula midline. No oropharyngeal exudate or posterior oropharyngeal erythema.     Tonsils: No tonsillar exudate or tonsillar abscesses.  Eyes:     Conjunctiva/sclera: Conjunctivae normal.     Pupils: Pupils are equal, round, and reactive to light.  Cardiovascular:     Rate and Rhythm: Normal rate and regular rhythm.     Heart sounds: Normal heart sounds.  Pulmonary:     Effort: Pulmonary effort is normal.     Breath sounds: Normal breath sounds. No wheezing, rhonchi or rales.  Musculoskeletal:     Cervical back: Normal range of motion and neck supple.  Lymphadenopathy:     Cervical: No cervical adenopathy.  Skin:    General: Skin is warm and dry.  Neurological:     General: No focal deficit present.     Mental Status: She is alert and oriented  to person, place, and time.  Psychiatric:        Mood and Affect: Mood normal.        Behavior: Behavior normal.      UC Treatments / Results  Labs (all labs ordered are listed, but only abnormal results are displayed) Labs Reviewed - No data to display  EKG   Radiology No results found.  Procedures Procedures (including critical care time)  Medications Ordered in UC Medications - No data to display  Initial Impression / Assessment and Plan / UC Course  I have reviewed the triage vital signs and the nursing  notes.  Pertinent labs & imaging results that were available during my care of the patient were reviewed by me and considered in my medical decision making (see chart for details).     Reviewed exam and symptoms with patient.  No red flags.  As patient did not tolerate oral prednisone will do steroid inhaler.  She was encouraged to pick up the albuterol inhaler that has already been sent to her pharmacy from her previous visit.  Continue Promethazine DM as needed.  Provisional prescription for doxycycline provided with instruction not to take unless symptoms do not improve or worsen over the next 3 days and she verbalized understanding.  Patient to follow-up with PCP in 2 to 3 days for recheck.  ER precautions reviewed and patient verbalized understanding. Final Clinical Impressions(s) / UC Diagnoses   Final diagnoses:  Acute bronchitis, unspecified organism  Mild intermittent asthma with acute exacerbation     Discharge Instructions      Stop the prednisone and start Flovent steroid inhaler twice daily.  Please pick up the albuterol inhaler that was sent to her pharmacy at your previous visit.  You can continue the Promethazine DM as needed for cough.  A provisional prescription has been sent for doxycycline.  Please do not take this unless your symptoms do not improve or worsen over the next 3 days.  Lots of rest and fluids.  Please follow-up with your PCP in 2 to 3 days for recheck.  Please go to the ER for any worsening symptoms.  I hope you feel better soon!    ED Prescriptions     Medication Sig Dispense Auth. Provider   fluticasone (FLOVENT HFA) 110 MCG/ACT inhaler Inhale 1 puff into the lungs 2 (two) times daily. 1 each Radford Pax, NP   doxycycline (VIBRAMYCIN) 100 MG capsule Take 1 capsule (100 mg total) by mouth 2 (two) times daily for 7 days. 14 capsule Radford Pax, NP      PDMP not reviewed this encounter.   Radford Pax, NP 02/02/23 [phone]

## 2023-02-02 NOTE — Discharge Instructions (Signed)
Stop the prednisone and start Flovent steroid inhaler twice daily.  Please pick up the albuterol inhaler that was sent to her pharmacy at your previous visit.  You can continue the Promethazine DM as needed for cough.  A provisional prescription has been sent for doxycycline.  Please do not take this unless your symptoms do not improve or worsen over the next 3 days.  Lots of rest and fluids.  Please follow-up with your PCP in 2 to 3 days for recheck.  Please go to the ER for any worsening symptoms.  I hope you feel better soon!

## 2023-02-02 NOTE — ED Triage Notes (Signed)
Pt presents with c/o a negative reaction to prednisone. Pt states she started having stomach pain and not being able to sleep after taking prednisone.

## 2023-02-15 ENCOUNTER — Other Ambulatory Visit: Payer: Self-pay | Admitting: Physician Assistant

## 2023-02-15 DIAGNOSIS — R600 Localized edema: Secondary | ICD-10-CM

## 2023-02-16 ENCOUNTER — Telehealth (HOSPITAL_COMMUNITY): Payer: Self-pay | Admitting: Licensed Clinical Social Worker

## 2023-02-16 NOTE — Telephone Encounter (Signed)
The therapist attempts to reach Tonya Perry in response to a referral received by Mr. Wallis Bamberg, PA-C. The therapist leves a HIPAA-compliant voicemail with his direct contact number which is [phone].  Myrna Blazer, MA, LCSW, [facility], LCAS 02/16/2023

## 2023-03-18 ENCOUNTER — Telehealth: Payer: Self-pay | Admitting: Adult Health

## 2023-03-18 NOTE — Telephone Encounter (Signed)
Pt called to confirm appt

## 2023-03-22 ENCOUNTER — Ambulatory Visit (INDEPENDENT_AMBULATORY_CARE_PROVIDER_SITE_OTHER): Payer: Medicare Other | Admitting: Physician Assistant

## 2023-03-22 ENCOUNTER — Other Ambulatory Visit (HOSPITAL_COMMUNITY)
Admission: RE | Admit: 2023-03-22 | Discharge: 2023-03-22 | Disposition: A | Discharge status: Home / Self Care | Payer: Medicare Other | Source: Ambulatory Visit | Attending: Physician Assistant | Admitting: Physician Assistant

## 2023-03-22 ENCOUNTER — Encounter: Payer: Self-pay | Admitting: Anesthesiology

## 2023-03-22 ENCOUNTER — Encounter: Payer: Self-pay | Admitting: Physician Assistant

## 2023-03-22 VITALS — BP 116/76 | HR 96 | Temp 97.8°F | Ht 64.0 in | Wt 271.8 lb

## 2023-03-22 DIAGNOSIS — N760 Acute vaginitis: Secondary | ICD-10-CM | POA: Diagnosis present

## 2023-03-22 DIAGNOSIS — F419 Anxiety disorder, unspecified: Secondary | ICD-10-CM | POA: Diagnosis not present

## 2023-03-22 DIAGNOSIS — F1010 Alcohol abuse, uncomplicated: Secondary | ICD-10-CM

## 2023-03-22 DIAGNOSIS — Z23 Encounter for immunization: Secondary | ICD-10-CM

## 2023-03-22 DIAGNOSIS — F32A Depression, unspecified: Secondary | ICD-10-CM

## 2023-03-22 DIAGNOSIS — N951 Menopausal and female climacteric states: Secondary | ICD-10-CM

## 2023-03-22 NOTE — Patient Instructions (Signed)
VISIT SUMMARY:  During today's visit, we discussed several of your ongoing health concerns, including perimenopause symptoms, a recent yeast infection, alcohol use, and your upcoming colon cancer screening. We also reviewed your recent mammogram results and your plans for a neurology appointment for sleep apnea. Additionally, we talked about your mental health and the impact of your recent divorce.  YOUR PLAN:  -VAGINAL YEAST INFECTION: A vaginal yeast infection is a common fungal infection that causes irritation, discharge, and intense itchiness of the vagina and the vulva. Since you still have symptoms despite using Monistat, we will perform a self-swab test to check for other infections such as gonorrhea, chlamydia, trichomonas, bacterial vaginosis, and yeast.  -PERIMENOPAUSE: Perimenopause is the transition period before menopause when hormonal changes can cause symptoms like sleep disturbances and fatigue. You should continue with your current management plan as directed by your gynecologist at [facility].  -ALCOHOL USE: You have significantly reduced your alcohol consumption, which is a positive step for your overall health. Please continue with this progress and keep monitoring your intake.  -COLON CANCER SCREENING: Colon cancer screening is important for early detection of colon cancer. You have a Cologuard test kit at home that you need to complete by Christmas.  -POTENTIAL INITIATION OF WEGOVY: [name] is a medication used for weight management. We are waiting for Medicaid approval. Please check your MyChart at the end of January to see if Medicaid has approved the prescription.  INSTRUCTIONS:  Please follow up in 6 months or sooner if Tonya Perry is initiated. Complete the Cologuard test by Christmas. Perform the self-swab test for vaginal infections as discussed.

## 2023-03-22 NOTE — Progress Notes (Signed)
Patient ID: Tonya Perry, female    DOB: 07/01/1975, 47 y.o.   MRN: [id number]   Assessment & Plan:  Immunization due -     Flu vaccine trivalent PF, 6mos and older(Flulaval,Afluria,Fluarix,Fluzone)  Morbid obesity (HCC)  Anxiety and depression  Alcohol abuse  Acute vaginitis -     Cervicovaginal ancillary only  Perimenopausal    Assessment and Plan    Vaginal Yeast Infection Persistent symptoms despite treatment with Monistat. Patient is sexually active in a polyamorous relationship. -Perform self-swab for gonorrhea, chlamydia, trichomonas, bacterial vaginosis, and yeast.  Perimenopause Ongoing symptoms, managed by GYN at [facility]. -Continue current management.  Alcohol Use Significant reduction in alcohol consumption from 24 packs/week to 2-3 times/week. -Continue current progress and monitor.  Colon Cancer Screening Cologuard kit at home, not yet completed. -Complete Cologuard test by Christmas.  Potential initiation of Wegovy Pending Medicaid approval. -Contact via MyChart end of January to check Medicaid status and potential for [facility] prescription.  Follow-up in 6 months or sooner if [facility] is initiated.          Return in about 6 months (around 09/20/2023) for recheck/follow-up.    Subjective:    Chief Complaint  Patient presents with   Medical Management of Chronic Issues    Pt in office to f/u with PCP; states was unable to get Wegovy due to cost; pt is still gaining weight, pt thinks menopause is an issue she is dealing with now, Pt c/o headaches, hot flashes; very emotional; her fibromyalgia is flaring up and in extreme pain; pt has had recent yeast infection, tried OTC monistat but no help. Requesting STD testing.     HPI Discussed the use of AI scribe software for clinical note transcription with the patient, who gave verbal consent to proceed.  History of Present Illness   Tonya Perry, a patient with a history of fibromyalgia and  perimenopause, presents with ongoing perimenopause symptoms which she describes as "really tough". She reports increased sleep and fatigue, but denies depression. She also mentions a recent yeast infection, which she has been treating with Monistat, but still has some residual symptoms. She expresses concern about potential sexually transmitted diseases due to being in a polyamorous relationship.  Tonya Perry also discusses her mental health, stating that she is still attending psychiatry appointments and is hoping her psychiatrist will accept Medicare. She reports a significant reduction in her alcohol consumption, from almost two 24-packs of Bud Light a week to drinking only two or three times a week.  She mentions a recent mammogram which was normal and a pending colon cancer screening. She has a Cologuard test at home but has not yet completed it. She also mentions an upcoming neurology appointment for her sleep apnea.  Tonya Perry also discusses her recent divorce and the emotional impact it has had on her, which may have contributed to her increased alcohol consumption.       Past Medical History:  Diagnosis Date   Alcohol abuse    Anxiety    Anxiety disorder 04/03/2015   Arthritis    Asthma    Asthma due to seasonal allergies    Depression    Depression    Fibromyalgia    History of chlamydia 09/06/2017   History of trichomoniasis 09/06/2017   + RNA test, neg wet prep in office POCT   Hypertension    Iron deficiency anemia    Kienbck's disease    LEFT WRIST   Left wrist pain  retained deep implant   Patella alta     Past Surgical History:  Procedure Laterality Date   APPENDECTOMY     LAPAROSCOPIC APPENDECTOMY N/A 11/04/2013   Procedure: APPENDECTOMY LAPAROSCOPIC;  Surgeon: Valarie Merino, MD;  Location: [facility];  Service: General;  Laterality: N/A;   REMOVAL OF IMPLANT Left 06/13/2015   Procedure: LEFT WRIST DEEP IMPLANT REMOVAL ;  Surgeon: Bradly Bienenstock, MD;  Location: [facility];  Service: Orthopedics;  Laterality: Left;   WRIST RECONSTRUCTION Left 04-15-2015    for Kienbock's disease     Family History  Problem Relation Age of Onset   Depression Mother    Diabetes Mother    Mental illness Mother    Anxiety disorder Mother    COPD Father    Mental illness Father    Hypertension Father    Hyperlipidemia Father    Mental illness Sister    Mental illness Sister    Depression Maternal Grandmother    Mental illness Maternal Grandmother    Diabetes Maternal Grandfather    Mental illness Maternal Grandfather    Cancer Paternal Grandfather    Mental illness Paternal Grandfather    Sleep apnea Neg Hx     Social History   Tobacco Use   Smoking status: Former    Current packs/day: 0.00    Types: Cigarettes    Start date: 04/07/1991    Quit date: 04/06/1996    Years since quitting: 26.9   Smokeless tobacco: Never  Vaping Use   Vaping status: Never Used  Substance Use Topics   Alcohol use: Yes    Alcohol/week: 20.0 standard drinks of alcohol    Types: 20 Cans of beer per week    Comment: hard seltzer   Drug use: Yes    Types: Marijuana     Allergies  Allergen Reactions   Other Itching, Other (See Comments) and Rash    Peas, Pineapple, Mango, Melons, Cantelope, Watermelons.  Dust mites, trees, grass and pollen.  Causes throat to swell with itching.  Tree nuts, causes throat to swell with itching.  Peas, Pineapple, Mango, Melons, Cantelope, Watermelons.  Dust mites, trees, grass and pollen.  Causes throat to swell with itching.  Tree nuts, causes throat to swell with itching.  Peas, Mango, Melons, Cantelope, Watermelons.  Dust mites, trees, grass and pollen.  Causes throat to swell with itching.   Pineapple Other (See Comments)    Causes throat to swell with itching.   Shellfish Allergy Itching and Swelling   Penicillins Rash and Other (See Comments)   Azithromycin Diarrhea   Latex Itching   Voltaren [Diclofenac Sodium] Other (See  Comments)    Upset stomach; made pt feel like had to go to bathroom    Review of Systems NEGATIVE UNLESS OTHERWISE INDICATED IN HPI      Objective:     BP 116/76 (BP Location: Left Arm, Patient Position: Sitting, Cuff Size: Large)   Pulse 96   Temp 97.8 F (36.6 C) (Temporal)   Ht 5\' 4"  (1.626 m)   Wt 271 lb 12.8 oz (123.3 kg)   SpO2 97%   BMI 46.65 kg/m   Wt Readings from Last 3 Encounters:  03/22/23 271 lb 12.8 oz (123.3 kg)  01/26/23 270 lb (122.5 kg)  11/19/22 271 lb 12.8 oz (123.3 kg)    BP Readings from Last 3 Encounters:  03/22/23 116/76  02/02/23 123/86  01/26/23 116/83     Physical Exam Vitals and nursing note  reviewed.  Constitutional:      General: She is not in acute distress.    Appearance: Normal appearance. She is obese. She is not ill-appearing.  HENT:     Head: Normocephalic.     Right Ear: External ear normal.     Left Ear: External ear normal.     Nose: No congestion.     Mouth/Throat:     Mouth: Mucous membranes are moist.     Pharynx: No oropharyngeal exudate or posterior oropharyngeal erythema.  Eyes:     Extraocular Movements: Extraocular movements intact.     Conjunctiva/sclera: Conjunctivae normal.     Pupils: Pupils are equal, round, and reactive to light.  Cardiovascular:     Rate and Rhythm: Normal rate and regular rhythm.     Pulses: Normal pulses.     Heart sounds: Normal heart sounds. No murmur heard. Pulmonary:     Effort: Pulmonary effort is normal. No respiratory distress.     Breath sounds: Normal breath sounds. No wheezing.  Musculoskeletal:     Cervical back: Normal range of motion.  Skin:    General: Skin is warm.  Neurological:     Mental Status: She is alert and oriented to person, place, and time.  Psychiatric:        Mood and Affect: Mood normal.        Behavior: Behavior normal.       Xylah Early M Fabiha Rougeau, PA-C

## 2023-03-23 LAB — CERVICOVAGINAL ANCILLARY ONLY
Bacterial Vaginitis (gardnerella): POSITIVE — AB
Candida Glabrata: POSITIVE — AB
Candida Vaginitis: POSITIVE — AB
Chlamydia: NEGATIVE
Comment: NEGATIVE
Comment: NEGATIVE
Comment: NEGATIVE
Comment: NEGATIVE
Comment: NEGATIVE
Comment: NORMAL
Neisseria Gonorrhea: NEGATIVE
Trichomonas: NEGATIVE

## 2023-03-24 ENCOUNTER — Encounter: Payer: Self-pay | Admitting: Adult Health

## 2023-03-24 ENCOUNTER — Ambulatory Visit (INDEPENDENT_AMBULATORY_CARE_PROVIDER_SITE_OTHER): Payer: Medicare Other | Admitting: Adult Health

## 2023-03-24 ENCOUNTER — Other Ambulatory Visit: Payer: Self-pay | Admitting: Physician Assistant

## 2023-03-24 VITALS — BP 125/74 | HR 88 | Ht 64.0 in | Wt 273.0 lb

## 2023-03-24 DIAGNOSIS — G4733 Obstructive sleep apnea (adult) (pediatric): Secondary | ICD-10-CM | POA: Diagnosis not present

## 2023-03-24 MED ORDER — FLUCONAZOLE 150 MG PO TABS
ORAL_TABLET | ORAL | 0 refills | Status: DC
Start: 2023-03-24 — End: 2023-12-21

## 2023-03-24 MED ORDER — METRONIDAZOLE 500 MG PO TABS: 500.0000 mg | ORAL_TABLET | Freq: Two times a day (BID) | ORAL | 0 refills | Status: DC

## 2023-03-24 NOTE — Progress Notes (Signed)
PATIENT: Tonya Perry DOB: 1976-01-11  REASON FOR VISIT: follow up HISTORY FROM: patient PRIMARY NEUROLOGIST: Dr. Frances Furbish  Chief Complaint  Patient presents with   Follow-up    Pt in 19, here alone  Pt is here for follow up on OSA with BiPAP. Pt states she is having a pain on her sides, she questions if it might have to do with her BiPAP machine. States she saw her PCP for the pain and was told, there is nothing wrong.      HISTORY OF PRESENT ILLNESS: Today 03/24/23:  Tonya Perry is a 47 y.o. female with a history of OSA on BiPAP. Returns today for follow-up.  Reports that CPAP is working well.  She denies any new symptoms.  Returns today for follow-up.    03/23/22: Tonya Perry is a 47 year old female with a history of OSA on CPAP. She returns today for follow-up. Reports that CPAP is working well. Denies any new symptoms. CPAP is working well. Still has fatigue. Has not seen PCP.     03/20/21: Tonya Perry is a 47 year old female with a history of obstructive sleep apnea on BiPAP.  She states that she currently has a fullface mask.  She needs to have the DreamWear full face and liked it better.  She states current mask leaks a lot.      REVIEW OF SYSTEMS: Out of a complete 14 system review of symptoms, the patient complains only of the following symptoms, and all other reviewed systems are negative.  FSS 57 ESS 3  ALLERGIES: Allergies  Allergen Reactions   Other Itching, Other (See Comments) and Rash    Peas, Pineapple, Mango, Melons, Cantelope, Watermelons.  Dust mites, trees, grass and pollen.  Causes throat to swell with itching.  Tree nuts, causes throat to swell with itching.  Peas, Pineapple, Mango, Melons, Cantelope, Watermelons.  Dust mites, trees, grass and pollen.  Causes throat to swell with itching.  Tree nuts, causes throat to swell with itching.  Peas, Mango, Melons, Cantelope, Watermelons.  Dust mites, trees, grass and pollen.  Causes  throat to swell with itching.   Pineapple Other (See Comments)    Causes throat to swell with itching.   Shellfish Allergy Itching and Swelling   Penicillins Rash and Other (See Comments)   Azithromycin Diarrhea   Latex Itching   Voltaren [Diclofenac Sodium] Other (See Comments)    Upset stomach; made pt feel like had to go to bathroom    HOME MEDICATIONS: Outpatient Medications Prior to Visit  Medication Sig Dispense Refill   acetaminophen (TYLENOL) 500 MG tablet Take 500 mg by mouth.     cetirizine (ZYRTEC ALLERGY) 10 MG tablet Take 1 tablet (10 mg total) by mouth daily. 30 tablet 0   EPINEPHrine 0.3 mg/0.3 mL IJ SOAJ injection Inject 0.3 mg into the muscle as needed for anaphylaxis. 2 each 0   fluticasone (FLONASE) 50 MCG/ACT nasal spray Place 1 spray into both nostrils daily. Begin by using 2 sprays in each nare daily for 3 to 5 days, then decrease to 1 spray in each nare daily. 15.8 mL 2   gabapentin (NEURONTIN) 600 MG tablet Take 1 tab po qam and 1 tab po qafternoon and 2 tabs po qhs (Patient taking differently: 400 mg 3 (three) times daily. Take 1 tab po qam and 1 tab po qafternoon and 2 tabs po qhs) 360 tablet 1   hydrochlorothiazide (HYDRODIURIL) 25 MG tablet TAKE 1 TABLET (25 MG  TOTAL) BY MOUTH DAILY. 90 tablet 0   ibuprofen (ADVIL,MOTRIN) 800 MG tablet Take 1 tablet (800 mg total) by mouth every 8 (eight) hours as needed. 20 tablet 0   lamoTRIgine (LAMICTAL) 25 MG tablet Take 25 mg by mouth 4 (four) times daily.     levonorgestrel (MIRENA, 52 MG,) 20 MCG/DAY IUD 1 each by Intrauterine route once.     naltrexone (DEPADE) 50 MG tablet Take 50 mg by mouth daily.     omeprazole (PRILOSEC) 40 MG capsule omeprazole 40 mg capsule,delayed release  TAKE 1 CAP BY MOUTH 30 MINUTES BEFORE A MEAL 90 capsule 3   promethazine-dextromethorphan (PROMETHAZINE-DM) 6.25-15 MG/5ML syrup Take 5 mLs by mouth 3 (three) times daily as needed for cough. 200 mL 0   sertraline (ZOLOFT) 100 MG tablet  sertraline 100 mg tablet     traZODone (DESYREL) 50 MG tablet Take 100 mg by mouth at bedtime.     No facility-administered medications prior to visit.    PAST MEDICAL HISTORY: Past Medical History:  Diagnosis Date   Alcohol abuse    Anxiety    Anxiety disorder 04/03/2015   Arthritis    Asthma    Asthma due to seasonal allergies    Depression    Depression    Fibromyalgia    History of chlamydia 09/06/2017   History of trichomoniasis 09/06/2017   + RNA test, neg wet prep in office POCT   Hypertension    Iron deficiency anemia    Kienbck's disease    LEFT WRIST   Left wrist pain    retained deep implant   Patella alta     PAST SURGICAL HISTORY: Past Surgical History:  Procedure Laterality Date   APPENDECTOMY     LAPAROSCOPIC APPENDECTOMY N/A 11/04/2013   Procedure: APPENDECTOMY LAPAROSCOPIC;  Surgeon: Valarie Merino, MD;  Location: [facility];  Service: General;  Laterality: N/A;   REMOVAL OF IMPLANT Left 06/13/2015   Procedure: LEFT WRIST DEEP IMPLANT REMOVAL ;  Surgeon: Bradly Bienenstock, MD;  Location: [facility] ;  Service: Orthopedics;  Laterality: Left;   WRIST RECONSTRUCTION Left 04-15-2015    for Kienbock's disease     FAMILY HISTORY: Family History  Problem Relation Age of Onset   Depression Mother    Diabetes Mother    Mental illness Mother    Anxiety disorder Mother    COPD Father    Mental illness Father    Hypertension Father    Hyperlipidemia Father    Mental illness Sister    Mental illness Sister    Depression Maternal Grandmother    Mental illness Maternal Grandmother    Diabetes Maternal Grandfather    Mental illness Maternal Grandfather    Cancer Paternal Grandfather    Mental illness Paternal Grandfather    Sleep apnea Neg Hx     SOCIAL HISTORY: Social History   Socioeconomic History   Marital status: Legally Separated    Spouse name: [name]   Number of children: 1   Years of education: Assoc   Highest education level: Not  on file  Occupational History    Comment: not employed  Tobacco Use   Smoking status: Former    Current packs/day: 0.00    Types: Cigarettes    Start date: 04/07/1991    Quit date: 04/06/1996    Years since quitting: 26.9   Smokeless tobacco: Never  Vaping Use   Vaping status: Never Used  Substance and Sexual Activity   Alcohol use: Yes  Alcohol/week: 20.0 standard drinks of alcohol    Types: 20 Cans of beer per week    Comment: hard seltzer   Drug use: Yes    Types: Marijuana   Sexual activity: Yes    Birth control/protection: I.U.D.    Comment: 2015-- Placement Mirena IUD  Other Topics Concern   Not on file  Social History Narrative   Patient consumes 4-5 cups of caffeine daily.   Social Drivers of Corporate investment banker Strain: Not on file  Food Insecurity: Not on file  Transportation Needs: Not on file  Physical Activity: Not on file  Stress: Not on file  Social Connections: Not on file  Intimate Partner Violence: Not on file      PHYSICAL EXAM  Vitals:   03/24/23 1001  BP: 125/74  Pulse: 88  Weight: 273 lb (123.8 kg)  Height: 5\' 4"  (1.626 m)    Body mass index is 46.86 kg/m.  Generalized: Well developed, in no acute distress  Chest: Lungs clear to auscultation bilaterally  Neurological examination  Mentation: Alert oriented to time, place, history taking. Follows all commands speech and language fluent Cranial nerve II-XII: Facial symmetry noted  DIAGNOSTIC DATA (LABS, IMAGING, TESTING) - I reviewed patient records, labs, notes, testing and imaging myself where available.  Lab Results  Component Value Date   WBC 11.3 (H) 12/04/2022   HGB 12.6 12/04/2022   HCT 39.1 12/04/2022   MCV 92 12/04/2022   PLT 450 12/04/2022      Component Value Date/Time   NA 139 12/04/2022 1441   K 4.3 12/04/2022 1441   CL 99 12/04/2022 1441   CO2 27 12/04/2022 1441   GLUCOSE 88 12/04/2022 1441   GLUCOSE 92 11/19/2022 1212   BUN 9 12/04/2022 1441    CREATININE 0.88 12/04/2022 1441   CREATININE 0.77 08/01/2015 1345   CALCIUM 9.9 12/04/2022 1441   PROT 7.1 12/04/2022 1441   ALBUMIN 4.2 12/04/2022 1441   AST 29 12/04/2022 1441   ALT 29 12/04/2022 1441   ALKPHOS 104 12/04/2022 1441   BILITOT 0.3 12/04/2022 1441   GFRNONAA 96 07/01/2017 1218   GFRAA 111 07/01/2017 1218   Lab Results  Component Value Date   CHOL 182 11/19/2022   HDL 52.10 11/19/2022   LDLDIRECT 120.0 11/19/2022   TRIG 271.0 (H) 11/19/2022   CHOLHDL 3 11/19/2022   Lab Results  Component Value Date   HGBA1C 5.7 11/19/2022   Lab Results  Component Value Date   VITAMINB12 267 11/19/2022   Lab Results  Component Value Date   TSH 0.86 11/19/2022      ASSESSMENT AND PLAN 47 y.o. year old female  has a past medical history of Alcohol abuse, Anxiety, Anxiety disorder (04/03/2015), Arthritis, Asthma, Asthma due to seasonal allergies, Depression, Depression, Fibromyalgia, History of chlamydia (09/06/2017), History of trichomoniasis (09/06/2017), Hypertension, Iron deficiency anemia, Kienbck's disease, Left wrist pain, and Patella alta. here with:  OSA on BiPAP  - BIPAP compliance excellent - Good treatment of AHI  - Encourage patient to use BiPAP nightly and > 4 hours each night - F/U in 1 year or sooner if needed   Butch Penny, MSN, NP-C 03/24/2023, 9:51 AM [facility] [address] [place], [place] [address] [phone]

## 2023-04-21 ENCOUNTER — Encounter: Payer: Self-pay | Admitting: Physician Assistant

## 2023-04-21 NOTE — Telephone Encounter (Signed)
 Please see patient message and response to previous message and advise.

## 2023-04-26 ENCOUNTER — Other Ambulatory Visit: Payer: Self-pay

## 2023-04-26 MED ORDER — WEGOVY 0.25 MG/0.5ML ~~LOC~~ SOAJ: 0.2500 mg | SUBCUTANEOUS | 0 refills | Status: DC

## 2023-05-12 ENCOUNTER — Ambulatory Visit: Payer: Self-pay | Admitting: Physician Assistant

## 2023-05-12 ENCOUNTER — Telehealth: Payer: Self-pay

## 2023-05-12 NOTE — Telephone Encounter (Addendum)
  Chief Complaint: IUD removal Symptoms: None  Pertinent Negatives: Patient denies symptoms Disposition: [] ED /[] Urgent Care (no appt availability in office) / [] Appointment(In office/virtual)/ []  [place]/ [] Home Care/ [] Refused Recommended Disposition /[] [facility]/ [x]  Follow-up with PCP Additional Notes:     Patient wanting to schedule appt to have IUD removed and replaced. States that her OB/GYN Dr Gretta started her on a estrogen patch and advised her to have it removed. Patient states that her OOP expense is too high to have her ob/gyn remove the iud so she wanted to see if her PCP would be able to do so and if so, what the OOP expense would be.Will route to clinic to advise. Call back #[phone]  ** Appt scheduled for 2/10- but advised pt that she will be contacted if her PCP is unable to perform her request. Pt verbalized understanding  Reason for Disposition  Caller requesting an appointment, triage offered and declined  Answer Assessment - Initial Assessment Questions 1. REASON FOR CALL or QUESTION: What is your reason for calling today? or How can I best help you? or What question do you have that I can help answer?     Patient wanting to schedule appt to have IUD removed and replaced. States that her OB/GYN Dr Gretta started her on a estrogen patch and advised her to have it removed. Patient states that her OOP expense is very high to have her ob/gyn remove the iud so she wanted to see if her PCP would be able to do so.   2. CALLER: Document the source of call. (e.g., laboratory, patient).     Patient  Protocols used: PCP Call - No Triage-A-AH

## 2023-05-12 NOTE — Telephone Encounter (Signed)
 Pt needing IUD taken out. Unable to find a provider to take it out and replace- advised bu OBGYN that her insurance will not pay for it. [facility]. Pt stated she wanted it out now. Advised [facility] and pt stated she called. Transferred pt to [facility] nurse [place].

## 2023-05-13 NOTE — Telephone Encounter (Signed)
 Noted and agreed, thank you.

## 2023-05-13 NOTE — Telephone Encounter (Signed)
 FYI

## 2023-05-17 ENCOUNTER — Ambulatory Visit: Payer: Medicare Other | Admitting: Physician Assistant

## 2023-05-26 ENCOUNTER — Ambulatory Visit: Payer: Medicare Other

## 2023-05-26 ENCOUNTER — Telehealth: Payer: Self-pay | Admitting: Physician Assistant

## 2023-05-26 NOTE — Telephone Encounter (Signed)
Copied from CRM [id number]. Topic: Medicare AWV >> May 26, 2023 11:55 AM Payton Doughty wrote: Reason for CRM: LVM 05/26/2023 due to provider not in office today the 400p AWV appt w/ Lorrie has been cancelled and we will call you back to r/s your AWV  Verlee Rossetti; Care Guide Ambulatory Clinical Support [place] [facility] Direct Dial: [phone]

## 2023-06-02 ENCOUNTER — Ambulatory Visit (INDEPENDENT_AMBULATORY_CARE_PROVIDER_SITE_OTHER): Payer: Medicare Other

## 2023-06-02 VITALS — Ht 64.0 in | Wt 273.0 lb

## 2023-06-02 DIAGNOSIS — Z0000 Encounter for general adult medical examination without abnormal findings: Secondary | ICD-10-CM

## 2023-06-02 NOTE — Progress Notes (Signed)
 Subjective:   Tonya Perry is a 48 y.o. who presents for a Medicare Wellness preventive visit.  Visit Complete: Virtual I connected with  Lorie Phenix on 06/02/23 by a audio enabled telemedicine application and verified that I am speaking with the correct person using two identifiers.  Patient Location: Home  Provider Location: Home Office  I discussed the limitations of evaluation and management by telemedicine. The patient expressed understanding and agreed to proceed.  Vital Signs: Because this visit was a virtual/telehealth visit, some criteria may be missing or patient reported. Any vitals not documented were not able to be obtained and vitals that have been documented are patient reported.    AWV Questionnaire: No: Patient Medicare AWV questionnaire was not completed prior to this visit.  Cardiac Risk Factors include: advanced age (>22men, >44 women);obesity (BMI >30kg/m2)     Objective:    Today's Vitals   06/02/23 1044  Weight: 273 lb (123.8 kg)  Height: 5\' 4"  (1.626 m)   Body mass index is 46.86 kg/m.     04/11/2018    9:42 AM 11/14/2016   11:13 AM 06/13/2015    1:10 PM 02/13/2014    8:59 AM  Advanced Directives  Does Patient Have a Medical Advance Directive? No No No No  Would patient like information on creating a medical advance directive? No - Patient declined  Yes - Transport planner given Yes - Transport planner given    Current Medications (verified) Outpatient Encounter Medications as of 06/02/2023  Medication Sig   acetaminophen (TYLENOL) 500 MG tablet Take 500 mg by mouth.   cetirizine (ZYRTEC ALLERGY) 10 MG tablet Take 1 tablet (10 mg total) by mouth daily.   fluconazole (DIFLUCAN) 150 MG tablet Take 1 tab po now, and then take 2nd tablet po after completion of flagyl.   fluticasone (FLONASE) 50 MCG/ACT nasal spray Place 1 spray into both nostrils daily. Begin by using 2 sprays in each nare daily for 3 to 5 days, then decrease to 1  spray in each nare daily.   gabapentin (NEURONTIN) 600 MG tablet Take 1 tab po qam and 1 tab po qafternoon and 2 tabs po qhs (Patient taking differently: 400 mg 3 (three) times daily. Take 1 tab po qam and 1 tab po qafternoon and 2 tabs po qhs)   hydrochlorothiazide (HYDRODIURIL) 25 MG tablet TAKE 1 TABLET (25 MG TOTAL) BY MOUTH DAILY.   ibuprofen (ADVIL,MOTRIN) 800 MG tablet Take 1 tablet (800 mg total) by mouth every 8 (eight) hours as needed.   lamoTRIgine (LAMICTAL) 25 MG tablet Take 25 mg by mouth 4 (four) times daily.   levonorgestrel (MIRENA, 52 MG,) 20 MCG/DAY IUD 1 each by Intrauterine route once.   metroNIDAZOLE (FLAGYL) 500 MG tablet Take 1 tablet (500 mg total) by mouth 2 (two) times daily. Do not use alcohol while taking this medication.   omeprazole (PRILOSEC) 40 MG capsule omeprazole 40 mg capsule,delayed release  TAKE 1 CAP BY MOUTH 30 MINUTES BEFORE A MEAL   promethazine-dextromethorphan (PROMETHAZINE-DM) 6.25-15 MG/5ML syrup Take 5 mLs by mouth 3 (three) times daily as needed for cough.   Semaglutide-Weight Management (WEGOVY) 0.25 MG/0.5ML SOAJ Inject 0.25 mg into the skin once a week.   sertraline (ZOLOFT) 100 MG tablet sertraline 100 mg tablet   traZODone (DESYREL) 50 MG tablet Take 100 mg by mouth at bedtime.   EPINEPHrine 0.3 mg/0.3 mL IJ SOAJ injection Inject 0.3 mg into the muscle as needed for anaphylaxis. (Patient not taking:  Reported on 06/02/2023)   naltrexone (DEPADE) 50 MG tablet Take 50 mg by mouth daily. (Patient not taking: Reported on 06/02/2023)   No facility-administered encounter medications on file as of 06/02/2023.    Allergies (verified) Other, Pineapple, Shellfish allergy, Penicillins, Azithromycin, Latex, and Voltaren [diclofenac sodium]   History: Past Medical History:  Diagnosis Date   Alcohol abuse    Anxiety    Anxiety disorder 04/03/2015   Arthritis    Asthma    Asthma due to seasonal allergies    Depression    Depression    Fibromyalgia     History of chlamydia 09/06/2017   History of trichomoniasis 09/06/2017   + RNA test, neg wet prep in office POCT   Hypertension    Iron deficiency anemia    Kienbck's disease    LEFT WRIST   Left wrist pain    retained deep implant   Patella alta    Past Surgical History:  Procedure Laterality Date   APPENDECTOMY     LAPAROSCOPIC APPENDECTOMY N/A 11/04/2013   Procedure: APPENDECTOMY LAPAROSCOPIC;  Surgeon: Valarie Merino, MD;  Location: [facility];  Service: General;  Laterality: N/A;   REMOVAL OF IMPLANT Left 06/13/2015   Procedure: LEFT WRIST DEEP IMPLANT REMOVAL ;  Surgeon: Bradly Bienenstock, MD;  Location: [facility];  Service: Orthopedics;  Laterality: Left;   WRIST RECONSTRUCTION Left 04-15-2015    for Kienbock's disease    Family History  Problem Relation Age of Onset   Depression Mother    Diabetes Mother    Mental illness Mother    Anxiety disorder Mother    COPD Father    Mental illness Father    Hypertension Father    Hyperlipidemia Father    Mental illness Sister    Mental illness Sister    Depression Maternal Grandmother    Mental illness Maternal Grandmother    Diabetes Maternal Grandfather    Mental illness Maternal Grandfather    Cancer Paternal Grandfather    Mental illness Paternal Grandfather    Sleep apnea Neg Hx    Social History   Socioeconomic History   Marital status: Legally Separated    Spouse name: [name]   Number of children: 1   Years of education: Assoc   Highest education level: Not on file  Occupational History    Comment: not employed  Tobacco Use   Smoking status: Former    Current packs/day: 0.00    Types: Cigarettes    Start date: 04/07/1991    Quit date: 04/06/1996    Years since quitting: 27.1   Smokeless tobacco: Never  Vaping Use   Vaping status: Never Used  Substance and Sexual Activity   Alcohol use: Yes    Alcohol/week: 20.0 standard drinks of alcohol    Types: 20 Cans of beer per week    Comment: hard  seltzer   Drug use: Not Currently    Types: Marijuana   Sexual activity: Yes    Birth control/protection: I.U.D.    Comment: 2015-- Placement Mirena IUD  Other Topics Concern   Not on file  Social History Narrative   Patient consumes 4-5 cups of caffeine daily.   Social Drivers of Corporate investment banker Strain: Low Risk  (06/02/2023)   Overall Financial Resource Strain (CARDIA)    Difficulty of Paying Living Expenses: Not hard at all  Food Insecurity: No Food Insecurity (06/02/2023)   Hunger Vital Sign    Worried About Running Out of  Food in the Last Year: Never true    Ran Out of Food in the Last Year: Never true  Transportation Needs: No Transportation Needs (06/02/2023)   PRAPARE - Administrator, Civil Service (Medical): No    Lack of Transportation (Non-Medical): No  Physical Activity: Inactive (06/02/2023)   Exercise Vital Sign    Days of Exercise per Week: 0 days    Minutes of Exercise per Session: 0 min  Stress: Stress Concern Present (06/02/2023)   [employer] of Occupational Health - Occupational Stress Questionnaire    Feeling of Stress : To some extent  Social Connections: Socially Isolated (06/02/2023)   Social Connection and Isolation Panel [NHANES]    Frequency of Communication with Friends and Family: More than three times a week    Frequency of Social Gatherings with Friends and Family: Once a week    Attends Religious Services: Never    Database administrator or Organizations: No    Attends Engineer, structural: Never    Marital Status: Separated    Tobacco Counseling Counseling given: Not Answered    Clinical Intake:  Pre-visit preparation completed: Yes  Pain : No/denies pain     BMI - recorded: 46.86 Nutritional Status: BMI > 30  Obese Diabetes: No  How often do you need to have someone help you when you read instructions, pamphlets, or other written materials from your doctor or pharmacy?: 1 -  Never  Interpreter Needed?: No  Information entered by :: Lanier Ensign, LPN   Activities of Daily Living     06/02/2023   10:46 AM  In your present state of health, do you have any difficulty performing the following activities:  Hearing? 0  Vision? 0  Difficulty concentrating or making decisions? 0  Walking or climbing stairs? 1  Dressing or bathing? 0  Doing errands, shopping? 0  Preparing Food and eating ? N  Using the Toilet? N  In the past six months, have you accidently leaked urine? N  Do you have problems with loss of bowel control? N  Managing your Medications? N  Managing your Finances? N  Housekeeping or managing your Housekeeping? N    Patient Care Team: Allwardt, Crist Infante, PA-C as PCP - General (Physician Assistant) Levi Aland, MD as Consulting Physician (Obstetrics and Gynecology) Irena Cords, Enzo Montgomery, MD (Allergy and Immunology) Tamela Oddi, PA-C (Physician Assistant)  Indicate any recent Medical Services you may have received from other than [facility] providers in the past year (date may be approximate).     Assessment:   This is a routine wellness examination for Timmie.  Hearing/Vision screen Hearing Screening - Comments:: Pt denies any hearing issues  Vision Screening - Comments:: Pt request referral  for eye provider    Goals Addressed             This Visit's Progress    Patient Stated       Aim for 30 minutes of exercise or brisk walking, 6-8 glasses of water, and 5 servings of fruits and vegetables each day.        Depression Screen     06/02/2023   10:48 AM 03/22/2023    1:50 PM 11/19/2022   11:41 AM 07/17/2022    2:24 PM 07/17/2022    1:49 PM 04/27/2018   10:42 AM 03/05/2018    9:34 AM  PHQ 2/9 Scores  PHQ - 2 Score 6 5 6 6 4 2  0  PHQ- 9  Score 14 22 18 19  12      Fall Risk     06/02/2023   10:52 AM 03/22/2023    1:50 PM 11/19/2022   11:41 AM 07/17/2022    1:50 PM 04/27/2018   10:42 AM  Fall Risk   Falls in the past year?  0 0 0 0 0  Number falls in past yr: 0 0 0 0   Injury with Fall? 0 0 0 0   Risk for fall due to : No Fall Risks No Fall Risks No Fall Risks No Fall Risks   Follow up Falls prevention discussed Falls evaluation completed Falls evaluation completed Falls evaluation completed     MEDICARE RISK AT HOME:  Medicare Risk at Home Any stairs in or around the home?: No If so, are there any without handrails?: No Home free of loose throw rugs in walkways, pet beds, electrical cords, etc?: Yes Adequate lighting in your home to reduce risk of falls?: Yes Life alert?: No Use of a cane, walker or w/c?: No Grab bars in the bathroom?: No Shower chair or bench in shower?: No Elevated toilet seat or a handicapped toilet?: No  TIMED UP AND GO:  Was the test performed?  No  Cognitive Function: 6CIT completed        06/02/2023   10:53 AM  6CIT Screen  What Year? 0 points  What month? 0 points  What time? 0 points  Count back from 20 0 points  Months in reverse 0 points  Repeat phrase 0 points  Total Score 0 points    Immunizations Immunization History  Administered Date(s) Administered   Influenza Inj Mdck Quad Pf 01/08/2018   Influenza, Seasonal, Injecte, Preservative Fre 03/22/2023   Influenza,inj,Quad PF,6+ Mos 02/16/2015, 12/05/2015, 01/15/2017, 12/04/2018   Influenza-Unspecified 02/02/2018   PFIZER(Purple Top)SARS-COV-2 Vaccination 02/10/2020   PPD Test 11/11/2011   Tdap 04/06/2010, 11/19/2022    Screening Tests Health Maintenance  Topic Date Due   Pneumococcal Vaccine 97-53 Years old (1 of 2 - PCV) Never done   COVID-19 Vaccine (2 - Pfizer risk series) 03/02/2020   Fecal DNA (Cologuard)  11/19/2023 (Originally 08/02/2020)   MAMMOGRAM  07/20/2023   Medicare Annual Wellness (AWV)  06/01/2024   Cervical Cancer Screening (HPV/Pap Cotest)  05/06/2026   DTaP/Tdap/Td (3 - Td or Tdap) 11/18/2032   INFLUENZA VACCINE  Completed   Hepatitis C Screening  Completed   HIV Screening   Completed   HPV VACCINES  Aged Out    Health Maintenance  Health Maintenance Due  Topic Date Due   Pneumococcal Vaccine 60-46 Years old (1 of 2 - PCV) Never done   COVID-19 Vaccine (2 - Pfizer risk series) 03/02/2020   Health Maintenance Items Addressed:   Additional Screening:  Vision Screening: Recommended annual ophthalmology exams for early detection of glaucoma and other disorders of the eye.  Dental Screening: Recommended annual dental exams for proper oral hygiene  Community Resource Referral / Chronic Care Management: CRR required this visit?  No   CCM required this visit?  No     Plan:     I have personally reviewed and noted the following in the patient's chart:   Medical and social history Use of alcohol, tobacco or illicit drugs  Current medications and supplements including opioid prescriptions. Patient is not currently taking opioid prescriptions. Functional ability and status Nutritional status Physical activity Advanced directives List of other physicians Hospitalizations, surgeries, and ER visits in previous 12 months Vitals Screenings  to include cognitive, depression, and falls Referrals and appointments  In addition, I have reviewed and discussed with patient certain preventive protocols, quality metrics, and best practice recommendations. A written personalized care plan for preventive services as well as general preventive health recommendations were provided to patient.     Marzella Schlein, LPN   09/08/5407   After Visit Summary: (MyChart) Due to this being a telephonic visit, the after visit summary with patients personalized plan was offered to patient via MyChart   Notes:  Pt stated she is in counseling and prescribed medication for depression so no further request were made, pt did state she was in need of a eye provider referral at this time

## 2023-06-02 NOTE — Patient Instructions (Signed)
 Ms. Redditt , Thank you for taking time to come for your Medicare Wellness Visit. I appreciate your ongoing commitment to your health goals. Please review the following plan we discussed and let me know if I can assist you in the future.   Referrals/Orders/Follow-Ups/Clinician Recommendations: Aim for 30 minutes of exercise or brisk walking, 6-8 glasses of water, and 5 servings of fruits and vegetables each day.   This is a list of the screening recommended for you and due dates:  Health Maintenance  Topic Date Due   Medicare Annual Wellness Visit  Never done   Pneumococcal Vaccination (1 of 2 - PCV) Never done   COVID-19 Vaccine (2 - Pfizer risk series) 03/02/2020   Cologuard (Stool DNA test)  11/19/2023*   Mammogram  07/20/2023   Pap with HPV screening  05/06/2026   DTaP/Tdap/Td vaccine (3 - Td or Tdap) 11/18/2032   Flu Shot  Completed   Hepatitis C Screening  Completed   HIV Screening  Completed   HPV Vaccine  Aged Out  *Topic was postponed. The date shown is not the original due date.    Advanced directives: (Declined) Advance directive discussed with you today. Even though you declined this today, please call our office should you change your mind, and we can give you the proper paperwork for you to fill out.  Next Medicare Annual Wellness Visit scheduled for next year: Yes

## 2023-06-23 ENCOUNTER — Other Ambulatory Visit: Payer: Self-pay | Admitting: Physician Assistant

## 2023-08-05 LAB — HM MAMMOGRAPHY

## 2023-09-15 ENCOUNTER — Other Ambulatory Visit: Payer: Self-pay | Admitting: Physician Assistant

## 2023-09-15 DIAGNOSIS — R600 Localized edema: Secondary | ICD-10-CM

## 2023-09-15 MED ORDER — HYDROCHLOROTHIAZIDE 25 MG PO TABS: 25.0000 mg | ORAL_TABLET | Freq: Every day | ORAL | 0 refills | Status: DC

## 2023-09-15 NOTE — Telephone Encounter (Signed)
 Copied from CRM [id number]. Topic: Clinical - Medication Refill >> Sep 15, 2023  1:17 PM Emmet Harm C wrote: Medication: hydrochlorothiazide  (HYDRODIURIL ) 25 MG tablet  Has the patient contacted their pharmacy? Yes (Agent: If no, request that the patient contact the pharmacy for the refill. If patient does not wish to contact the pharmacy document the reason why and proceed with request.) (Agent: If yes, when and what did the pharmacy advise?)  This is the patient's preferred pharmacy:  [id number] [place], [place] - [address] [place] [address] Phone: [phone] Fax: [phone]  Is this the correct pharmacy for this prescription? Yes If no, delete pharmacy and type the correct one.   Has the prescription been filled recently? No  Is the patient out of the medication? No  Has the patient been seen for an appointment in the last year OR does the patient have an upcoming appointment? Yes  Can we respond through MyChart? No  Agent: Please be advised that Rx refills may take up to 3 business days. We ask that you follow-up with your pharmacy.

## 2023-09-20 ENCOUNTER — Encounter: Payer: Self-pay | Admitting: Physician Assistant

## 2023-09-20 ENCOUNTER — Encounter: Payer: Self-pay | Admitting: Obstetrics

## 2023-09-20 ENCOUNTER — Ambulatory Visit (INDEPENDENT_AMBULATORY_CARE_PROVIDER_SITE_OTHER): Payer: Medicare Other | Admitting: Physician Assistant

## 2023-09-20 DIAGNOSIS — Z1211 Encounter for screening for malignant neoplasm of colon: Secondary | ICD-10-CM

## 2023-09-20 DIAGNOSIS — Z6841 Body Mass Index (BMI) 40.0 and over, adult: Secondary | ICD-10-CM

## 2023-09-20 DIAGNOSIS — K219 Gastro-esophageal reflux disease without esophagitis: Secondary | ICD-10-CM

## 2023-09-20 MED ORDER — WEGOVY 0.25 MG/0.5ML ~~LOC~~ SOAJ
0.2500 mg | SUBCUTANEOUS | 0 refills | Status: DC
Start: 2023-09-20 — End: 2023-12-07

## 2023-09-20 NOTE — Progress Notes (Signed)
 Patient ID: Tonya Perry, female    DOB: 1975-08-31, 48 y.o.   MRN: [id number]   Assessment & Plan:  Morbid obesity (HCC) -     Wegovy ; Inject 0.25 mg into the skin once a week.  Dispense: 2 mL; Refill: 0  BMI 45.0-49.9, adult (HCC) -     Wegovy ; Inject 0.25 mg into the skin once a week.  Dispense: 2 mL; Refill: 0  Gastroesophageal reflux disease, unspecified whether esophagitis present -     Ambulatory referral to Gastroenterology  Screening for colon cancer -     Ambulatory referral to Gastroenterology      Assessment and Plan Assessment & Plan Obesity BMI of 49.26 with reported weight gain. Attempts to increase physical activity and improve diet. Previous use of Wegovy  was hindered by cost, but insurance changes may now allow coverage through Medicaid. - Resubmit prescription for Wegovy  with updated insurance information. - Encourage increased physical activity, including weight training and cardio. - Promote dietary changes to include more vegetables and fish. - Monitor for side effects after the third dose of Wegovy  and adjust dosage as needed.  Major Depressive Disorder Increased depression and anxiety with a high score on the depression and anxiety scale. Currently under psychiatric and therapeutic care. - Continue psychiatric care with [facility]. - Continue weekly therapy sessions.  Gastroesophageal Reflux Disease (GERD) Well-controlled with omeprazole , but significant symptoms without it. Attributed to alcohol consumption. Endoscopy recommended to assess for potential erosions or ulcers. - Continue omeprazole  for acid reflux management. - Refer to gastroenterology for endoscopy to assess for potential erosions or ulcers. - Encourage completion of both endoscopy and colonoscopy during the same procedure.  General Health Maintenance Recent mammogram completed. No colonoscopy performed, and at-home test (Cologuard) not completed. - Refer to gastroenterology  for colonoscopy.  Follow-up Last visit in November or December. Labs showed elevated triglycerides. Follow-up planned to monitor impact of potential Wegovy  treatment and lifestyle changes. - Schedule follow-up appointment in 3-4 months for lab work, including cholesterol levels. - Instruct to come fasting for the follow-up lab work.      Return in about 3 months (around 12/21/2023) for recheck/follow-up / fasting labs .    Subjective:    Chief Complaint  Patient presents with   Obesity   Anxiety   Depression    HPI Discussed the use of AI scribe software for clinical note transcription with the patient, who gave verbal consent to proceed.  History of Present Illness Tonya Perry is a 48 year old female with depression and obesity who presents with worsening depression and weight gain. She is accompanied by her daughter, Tonya Perry.  She has been experiencing worsening depression despite regular psychiatric care and weekly therapy sessions. Her depression and anxiety scores are notably high, and she feels her mental health has deteriorated since her last visit. No plans or SI. She will call her psych provider.  She has gained weight and has been attempting to increase her physical activity by walking with her mother. However, she has not walked since Thursday or Friday. She is currently on Medicare and Medicaid, and there have been issues with the cost of Wegovy , a medication for weight management, which was previously unaffordable even with her ex-husband's insurance. She is hopeful that her current insurance will cover it.  She has a history of acid reflux, which she attributes to her drinking habits. She takes omeprazole , which controls her symptoms well, but without it, she experiences significant  difficulty swallowing. She has not undergone an endoscopy or colonoscopy.  She uses a CPAP machine for sleep apnea and has been prescribed an estrogen patch by her  gynecologist. She has had a recent mammogram at [facility].  She is trying to improve her diet by eating more vegetables and fish, and she has been increasing her physical activity, starting with 15-minute walks and progressing to 40 minutes on some days.     Past Medical History:  Diagnosis Date   Alcohol abuse    Anxiety    Anxiety disorder 04/03/2015   Arthritis    Asthma    Asthma due to seasonal allergies    Depression    Depression    Fibromyalgia    History of chlamydia 09/06/2017   History of trichomoniasis 09/06/2017   + RNA test, neg wet prep in office POCT   Hypertension    Iron deficiency anemia    Kienbck's disease    LEFT WRIST   Left wrist pain    retained deep implant   Patella alta     Past Surgical History:  Procedure Laterality Date   APPENDECTOMY     LAPAROSCOPIC APPENDECTOMY N/A 11/04/2013   Procedure: APPENDECTOMY LAPAROSCOPIC;  Surgeon: Azucena Bollard, MD;  Location: [facility];  Service: General;  Laterality: N/A;   REMOVAL OF IMPLANT Left 06/13/2015   Procedure: LEFT WRIST DEEP IMPLANT REMOVAL ;  Surgeon: Arvil Birks, MD;  Location: [facility];  Service: Orthopedics;  Laterality: Left;   WRIST RECONSTRUCTION Left 04-15-2015    for Kienbock's disease     Family History  Problem Relation Age of Onset   Depression Mother    Diabetes Mother    Mental illness Mother    Anxiety disorder Mother    COPD Father    Mental illness Father    Hypertension Father    Hyperlipidemia Father    Mental illness Sister    Mental illness Sister    Depression Maternal Grandmother    Mental illness Maternal Grandmother    Diabetes Maternal Grandfather    Mental illness Maternal Grandfather    Cancer Paternal Grandfather    Mental illness Paternal Grandfather    Sleep apnea Neg Hx     Social History   Tobacco Use   Smoking status: Former    Current packs/day: 0.00    Types: Cigarettes    Start date: 04/07/1991    Quit date: 04/06/1996     Years since quitting: 27.4   Smokeless tobacco: Never  Vaping Use   Vaping status: Never Used  Substance Use Topics   Alcohol use: Yes    Alcohol/week: 20.0 standard drinks of alcohol    Types: 20 Cans of beer per week    Comment: hard seltzer   Drug use: Not Currently    Types: Marijuana     Allergies  Allergen Reactions   Other Itching, Other (See Comments) and Rash    Peas, Pineapple, Mango, Melons, Cantelope, Watermelons.  Dust mites, trees, grass and pollen.  Causes throat to swell with itching.  Tree nuts, causes throat to swell with itching.  Peas, Pineapple, Mango, Melons, Cantelope, Watermelons.  Dust mites, trees, grass and pollen.  Causes throat to swell with itching.  Tree nuts, causes throat to swell with itching.  Peas, Mango, Melons, Cantelope, Watermelons.  Dust mites, trees, grass and pollen.  Causes throat to swell with itching.   Pineapple Other (See Comments)    Causes throat to swell with  itching.   Shellfish Allergy Itching and Swelling   Penicillins Rash and Other (See Comments)   Azithromycin  Diarrhea   Latex Itching   Voltaren  [Diclofenac  Sodium] Other (See Comments)    Upset stomach; made pt feel like had to go to bathroom    Review of Systems NEGATIVE UNLESS OTHERWISE INDICATED IN HPI      Objective:     BP 125/76   Pulse 77   Temp 97.7 F (36.5 C)   Ht 5' 4 (1.626 m)   Wt 287 lb (130.2 kg)   SpO2 95%   BMI 49.26 kg/m   Wt Readings from Last 3 Encounters:  09/20/23 287 lb (130.2 kg)  06/02/23 273 lb (123.8 kg)  03/24/23 273 lb (123.8 kg)    BP Readings from Last 3 Encounters:  09/20/23 125/76  03/24/23 125/74  03/22/23 116/76     Physical Exam Vitals and nursing note reviewed.  Constitutional:      General: She is not in acute distress.    Appearance: Normal appearance. She is morbidly obese. She is not ill-appearing.  HENT:     Head: Normocephalic.   Eyes:     Extraocular Movements: Extraocular movements intact.      Conjunctiva/sclera: Conjunctivae normal.     Pupils: Pupils are equal, round, and reactive to light.    Cardiovascular:     Rate and Rhythm: Normal rate and regular rhythm.     Pulses: Normal pulses.     Heart sounds: Normal heart sounds. No murmur heard. Pulmonary:     Effort: Pulmonary effort is normal. No respiratory distress.     Breath sounds: Normal breath sounds. No wheezing.   Musculoskeletal:     Cervical back: Normal range of motion.   Skin:    General: Skin is warm.   Neurological:     Mental Status: She is alert and oriented to person, place, and time.   Psychiatric:        Mood and Affect: Mood normal.        Behavior: Behavior normal.             Chantrice Hagg M Jakayla Schweppe, PA-C

## 2023-09-22 ENCOUNTER — Other Ambulatory Visit (HOSPITAL_COMMUNITY): Payer: Self-pay

## 2023-09-22 ENCOUNTER — Telehealth: Payer: Self-pay

## 2023-09-22 NOTE — Telephone Encounter (Signed)
 Pharmacy Patient Advocate Encounter   Received notification from Onbase that prior authorization for Wegovy  0.25MG /0.5ML auto-injectors is required/requested.   Insurance verification completed.   The patient is insured through [employer] .   Per test claim: Drug not on formulary, Weight loss drugs not covered.  - We could try sending PA for Zepbound for OSA. If appropriate please advise.

## 2023-09-22 NOTE — Telephone Encounter (Signed)
 See message from PA Team.

## 2023-09-23 ENCOUNTER — Other Ambulatory Visit (HOSPITAL_COMMUNITY): Payer: Self-pay

## 2023-09-23 ENCOUNTER — Telehealth: Payer: Self-pay

## 2023-09-23 NOTE — Telephone Encounter (Signed)
 Pharmacy Patient Advocate Encounter  Received notification from [facility] MEDICARE that Prior Authorization for Zepbound 2.5MG /0.5ML pen-injectors has been APPROVED from 09/23/2023 to 04/05/2098. Ran test claim, Copay is $4.80. This test claim was processed through [facility]- copay amounts may vary at other pharmacies due to pharmacy/plan contracts, or as the patient moves through the different stages of their insurance plan.   PA #/Case ID/Reference #: [id number]

## 2023-09-23 NOTE — Telephone Encounter (Signed)
 Pharmacy Patient Advocate Encounter   Received notification from RX Request Messages that prior authorization for Zepbound 2.5MG /0.5ML pen-injectors is required/requested.   Insurance verification completed.   The patient is insured through [facility] MEDICARE.   Per test claim: PA required; PA submitted to above mentioned insurance via CoverMyMeds Key/confirmation #/EOC [id number] Status is pending

## 2023-09-24 NOTE — Telephone Encounter (Signed)
 Sent patient a my chart message regarding this, no further action needed.

## 2023-10-08 ENCOUNTER — Other Ambulatory Visit: Payer: Self-pay | Admitting: Physician Assistant

## 2023-10-08 DIAGNOSIS — R600 Localized edema: Secondary | ICD-10-CM

## 2023-10-11 ENCOUNTER — Other Ambulatory Visit: Payer: Self-pay | Admitting: Physician Assistant

## 2023-10-11 MED ORDER — OMEPRAZOLE 40 MG PO CPDR: DELAYED_RELEASE_CAPSULE | ORAL | 3 refills | Status: AC

## 2023-10-11 NOTE — Telephone Encounter (Signed)
 Copied from CRM [id number]. Topic: Clinical - Medication Question >> Oct 11, 2023  1:17 PM Tonya Perry wrote: Reason for CRM: Patient is requesting the Zepbound  be sent over to her pharmacy since she was approved for it, patient stated the pharmacy has not heard anything from the office regarding Zepbound .

## 2023-10-11 NOTE — Telephone Encounter (Signed)
 Copied from CRM [id number]. Topic: Clinical - Medication Refill >> Oct 11, 2023  1:16 PM Chiquita SQUIBB wrote: Medication: omeprazole  (PRILOSEC) 40 MG capsule [[id number]]  Has the patient contacted their pharmacy? Yes- patient is out of refills  (Agent: If no, request that the patient contact the pharmacy for the refill. If patient does not wish to contact the pharmacy document the reason why and proceed with request.) (Agent: If yes, when and what did the pharmacy advise?)  This is the patient's preferred pharmacy:  [id number] [place], [place] - [address] [place] [address] Phone: [phone] Fax: [phone]  Is this the correct pharmacy for this prescription? Yes If no, delete pharmacy and type the correct one.   Has the prescription been filled recently? No  Is the patient out of the medication? No  Has the patient been seen for an appointment in the last year OR does the patient have an upcoming appointment? Yes  Can we respond through MyChart? No  Agent: Please be advised that Rx refills may take up to 3 business days. We ask that you follow-up with your pharmacy.

## 2023-10-12 ENCOUNTER — Other Ambulatory Visit: Payer: Self-pay | Admitting: Physician Assistant

## 2023-10-12 DIAGNOSIS — G4733 Obstructive sleep apnea (adult) (pediatric): Secondary | ICD-10-CM

## 2023-10-12 DIAGNOSIS — Z6841 Body Mass Index (BMI) 40.0 and over, adult: Secondary | ICD-10-CM

## 2023-10-12 MED ORDER — ZEPBOUND 2.5 MG/0.5ML ~~LOC~~ SOAJ: 2.5000 mg | SUBCUTANEOUS | 0 refills | Status: DC

## 2023-10-13 ENCOUNTER — Ambulatory Visit
Admission: EM | Admit: 2023-10-13 | Discharge: 2023-10-13 | Disposition: A | Discharge status: Home / Self Care | Attending: Family Medicine | Admitting: Family Medicine

## 2023-10-13 DIAGNOSIS — J988 Other specified respiratory disorders: Secondary | ICD-10-CM

## 2023-10-13 DIAGNOSIS — J4531 Mild persistent asthma with (acute) exacerbation: Secondary | ICD-10-CM | POA: Diagnosis not present

## 2023-10-13 DIAGNOSIS — B9789 Other viral agents as the cause of diseases classified elsewhere: Secondary | ICD-10-CM | POA: Diagnosis not present

## 2023-10-13 MED ORDER — PROMETHAZINE-DM 6.25-15 MG/5ML PO SYRP: 5.0000 mL | ORAL_SOLUTION | Freq: Three times a day (TID) | ORAL | 0 refills | Status: DC | PRN

## 2023-10-13 MED ORDER — ALBUTEROL SULFATE HFA 108 (90 BASE) MCG/ACT IN AERS
1.0000 | INHALATION_SPRAY | Freq: Four times a day (QID) | RESPIRATORY_TRACT | 0 refills | Status: AC | PRN
Start: 2023-10-13 — End: ?

## 2023-10-13 MED ORDER — METHYLPREDNISOLONE ACETATE 40 MG/ML IJ SUSP
40.0000 mg | Freq: Once | INTRAMUSCULAR | Status: AC
  Administered 2023-10-13: 40 mg via INTRAMUSCULAR

## 2023-10-13 NOTE — ED Provider Notes (Signed)
 [facility]  Note:  This document was prepared using Conservation officer, historic buildings and may include unintentional dictation errors.  MRN: [id number] DOB: 01-Aug-1975  Subjective:   Tonya Perry is a 48 y.o. female presenting for 4-day history of intermittent central chest pain with shortness of breath, wheezing, malaise and fatigue.  Feels like she has had a cold develop in the past 2 days.  She is not a smoker.  Does not have an albuterol  inhaler, is requesting a refill.  Has not previously tolerated oral steroids before.  She is still a drinker, has not been drinking the past 4 days due to her illness.  No diabetes.  She just picked up her prescription for Ceftin but has not started it.  No current facility-administered medications for this encounter.  Current Outpatient Medications:    acetaminophen  (TYLENOL ) 500 MG tablet, Take 500 mg by mouth., Disp: , Rfl:    cetirizine  (ZYRTEC  ALLERGY) 10 MG tablet, Take 1 tablet (10 mg total) by mouth daily., Disp: 30 tablet, Rfl: 0   EPINEPHrine  0.3 mg/0.3 mL IJ SOAJ injection, Inject 0.3 mg into the muscle as needed for anaphylaxis. (Patient not taking: Reported on 06/02/2023), Disp: 2 each, Rfl: 0   fluconazole  (DIFLUCAN ) 150 MG tablet, Take 1 tab po now, and then take 2nd tablet po after completion of flagyl ., Disp: 2 tablet, Rfl: 0   fluticasone  (FLONASE ) 50 MCG/ACT nasal spray, Place 1 spray into both nostrils daily. Begin by using 2 sprays in each nare daily for 3 to 5 days, then decrease to 1 spray in each nare daily., Disp: 15.8 mL, Rfl: 2   gabapentin  (NEURONTIN ) 600 MG tablet, Take 1 tab po qam and 1 tab po qafternoon and 2 tabs po qhs, Disp: 360 tablet, Rfl: 1   hydrochlorothiazide  (HYDRODIURIL ) 25 MG tablet, TAKE 1 TABLET (25 MG TOTAL) BY MOUTH DAILY., Disp: 90 tablet, Rfl: 0   ibuprofen  (ADVIL ,MOTRIN ) 800 MG tablet, Take 1 tablet (800 mg total) by mouth every 8 (eight) hours as needed., Disp: 20 tablet, Rfl:  0   lamoTRIgine (LAMICTAL) 25 MG tablet, Take 25 mg by mouth 4 (four) times daily., Disp: , Rfl:    levonorgestrel (MIRENA, 52 MG,) 20 MCG/DAY IUD, 1 each by Intrauterine route once., Disp: , Rfl:    metroNIDAZOLE  (FLAGYL ) 500 MG tablet, Take 1 tablet (500 mg total) by mouth 2 (two) times daily. Do not use alcohol while taking this medication., Disp: 14 tablet, Rfl: 0   naltrexone (DEPADE) 50 MG tablet, Take 50 mg by mouth daily., Disp: , Rfl:    omeprazole  (PRILOSEC) 40 MG capsule, TAKE 1 CAPSULE BY MOUTH 30 MINUTES BEFORE A MEAL, Disp: 90 capsule, Rfl: 3   promethazine -dextromethorphan (PROMETHAZINE -DM) 6.25-15 MG/5ML syrup, Take 5 mLs by mouth 3 (three) times daily as needed for cough., Disp: 200 mL, Rfl: 0   sertraline  (ZOLOFT ) 100 MG tablet, sertraline  100 mg tablet, Disp: , Rfl:    tirzepatide  (ZEPBOUND ) 2.5 MG/0.5ML Pen, Inject 2.5 mg into the skin once a week for 28 days., Disp: 2 mL, Rfl: 0   traZODone (DESYREL) 50 MG tablet, Take 100 mg by mouth at bedtime., Disp: , Rfl:    WEGOVY  0.25 MG/0.5ML SOAJ, Inject 0.25 mg into the skin once a week., Disp: 2 mL, Rfl: 0   Allergies  Allergen Reactions   Other Itching, Other (See Comments) and Rash    Peas, Pineapple, Mango, Melons, Cantelope, Watermelons.  Dust mites, trees, grass and  pollen.  Causes throat to swell with itching.  Tree nuts, causes throat to swell with itching.  Peas, Pineapple, Mango, Melons, Cantelope, Watermelons.  Dust mites, trees, grass and pollen.  Causes throat to swell with itching.  Tree nuts, causes throat to swell with itching.  Peas, Mango, Melons, Cantelope, Watermelons.  Dust mites, trees, grass and pollen.  Causes throat to swell with itching.   Pineapple Other (See Comments)    Causes throat to swell with itching.   Shellfish Allergy Itching and Swelling   Penicillins Rash and Other (See Comments)   Azithromycin  Diarrhea   Latex Itching   Voltaren  [Diclofenac  Sodium] Other (See Comments)    Upset  stomach; made pt feel like had to go to bathroom    Past Medical History:  Diagnosis Date   Alcohol abuse    Anxiety    Anxiety disorder 04/03/2015   Arthritis    Asthma    Asthma due to seasonal allergies    Depression    Depression    Fibromyalgia    History of chlamydia 09/06/2017   History of trichomoniasis 09/06/2017   + RNA test, neg wet prep in office POCT   Hypertension    Iron deficiency anemia    Kienbck's disease    LEFT WRIST   Left wrist pain    retained deep implant   Patella alta      Past Surgical History:  Procedure Laterality Date   APPENDECTOMY     LAPAROSCOPIC APPENDECTOMY N/A 11/04/2013   Procedure: APPENDECTOMY LAPAROSCOPIC;  Surgeon: Donnice KATHEE Lunger, MD;  Location: [facility];  Service: General;  Laterality: N/A;   REMOVAL OF IMPLANT Left 06/13/2015   Procedure: LEFT WRIST DEEP IMPLANT REMOVAL ;  Surgeon: Prentice Pagan, MD;  Location: [facility];  Service: Orthopedics;  Laterality: Left;   WRIST RECONSTRUCTION Left 04-15-2015    for Kienbock's disease     Family History  Problem Relation Age of Onset   Depression Mother    Diabetes Mother    Mental illness Mother    Anxiety disorder Mother    COPD Father    Mental illness Father    Hypertension Father    Hyperlipidemia Father    Mental illness Sister    Mental illness Sister    Depression Maternal Grandmother    Mental illness Maternal Grandmother    Diabetes Maternal Grandfather    Mental illness Maternal Grandfather    Cancer Paternal Grandfather    Mental illness Paternal Grandfather    Sleep apnea Neg Hx     Social History   Tobacco Use   Smoking status: Former    Current packs/day: 0.00    Types: Cigarettes    Start date: 04/07/1991    Quit date: 04/06/1996    Years since quitting: 27.5   Smokeless tobacco: Never  Vaping Use   Vaping status: Never Used  Substance Use Topics   Alcohol use: Yes    Alcohol/week: 20.0 standard drinks of alcohol    Types: 20 Cans of  beer per week    Comment: hard seltzer   Drug use: Not Currently    Types: Marijuana    ROS   Objective:   Vitals: There were no vitals taken for this visit.  Physical Exam Constitutional:      General: She is not in acute distress.    Appearance: Normal appearance. She is well-developed. She is not ill-appearing, toxic-appearing or diaphoretic.  HENT:     Head: Normocephalic  and atraumatic.     Right Ear: External ear normal.     Left Ear: External ear normal.     Nose: Nose normal.     Mouth/Throat:     Mouth: Mucous membranes are moist.  Eyes:     General: No scleral icterus.       Right eye: No discharge.        Left eye: No discharge.     Extraocular Movements: Extraocular movements intact.  Cardiovascular:     Rate and Rhythm: Normal rate and regular rhythm.     Heart sounds: Normal heart sounds. No murmur heard.    No friction rub. No gallop.  Pulmonary:     Effort: No respiratory distress.     Breath sounds: No stridor. Wheezing (trace throughout) present. No rhonchi or rales.  Chest:     Chest wall: No tenderness.  Skin:    General: Skin is warm and dry.  Neurological:     General: No focal deficit present.     Mental Status: She is alert and oriented to person, place, and time.  Psychiatric:        Mood and Affect: Mood normal.        Behavior: Behavior normal.    IM Depo-Medrol  40 mg administered in clinic.  ED ECG REPORT   Date: 10/13/2023  EKG Time: 6:28 PM  Rate: 78bpm  Rhythm: normal sinus rhythm,  there are no previous tracings available for comparison  Axis: normal  Intervals:none  ST&T Change: T wave flattening in lead aVL, V2  Narrative Interpretation: Sinus rhythm at 78 bpm with nonspecific T wave changes above.  No previous EKG available for comparison.   Assessment and Plan :   PDMP not reviewed this encounter.  1. Viral respiratory infection   2. Mild persistent asthma with (acute) exacerbation    IM Depo-Medrol  as above,  refilled her albuterol  inhaler.  Suspect she is undergoing viral respiratory infection with an acute exacerbation of her asthma.  Recommend general supportive care.  Counseled patient on potential for adverse effects with medications prescribed/recommended today, ER and return-to-clinic precautions discussed, patient verbalized understanding.    Christopher Savannah, [place] 10/13/23 [id number]

## 2023-10-13 NOTE — ED Triage Notes (Signed)
 Pt c/o intermittent central CP started yesterday-c/o a cold HA, fatigue, minimal wheezing, SHOB started 7/5-NAD-steady gait

## 2023-10-13 NOTE — Discharge Instructions (Signed)
 We will manage this as a viral respiratory infection likely worsened by your asthma. You have received IM Depomedrol to help with your breathing. For sore throat or cough try using a honey-based tea. Use 3 teaspoons of honey with juice squeezed from half lemon. Place shaved pieces of ginger into 1/2-1 cup of water and warm over stove top. Then mix the ingredients and repeat every 4 hours as needed. Please take Tylenol  500mg -650mg  once every 6 hours for fevers, aches and pains. Hydrate very well with at least 2 liters (64 ounces) of water. Eat light meals such as soups (chicken and noodles, chicken wild rice, vegetable).  Do not eat any foods that you are allergic to.  Start an antihistamine like Zyrtec  (10mg  daily) for postnasal drainage, sinus congestion.  You can take this together with albuterol  inhaler for shortness of breath, wheezing. Use cough syrup as needed.

## 2023-11-01 ENCOUNTER — Other Ambulatory Visit: Payer: Self-pay

## 2023-11-01 DIAGNOSIS — Z6841 Body Mass Index (BMI) 40.0 and over, adult: Secondary | ICD-10-CM

## 2023-11-01 DIAGNOSIS — G4733 Obstructive sleep apnea (adult) (pediatric): Secondary | ICD-10-CM

## 2023-11-01 MED ORDER — ZEPBOUND 2.5 MG/0.5ML ~~LOC~~ SOAJ: 2.5000 mg | SUBCUTANEOUS | 0 refills | Status: DC

## 2023-12-04 ENCOUNTER — Other Ambulatory Visit: Payer: Self-pay | Admitting: Physician Assistant

## 2023-12-04 DIAGNOSIS — Z6841 Body Mass Index (BMI) 40.0 and over, adult: Secondary | ICD-10-CM

## 2023-12-04 DIAGNOSIS — G4733 Obstructive sleep apnea (adult) (pediatric): Secondary | ICD-10-CM

## 2023-12-07 ENCOUNTER — Other Ambulatory Visit: Payer: Self-pay

## 2023-12-08 ENCOUNTER — Encounter: Payer: Self-pay | Admitting: Physician Assistant

## 2023-12-14 LAB — HM PAP SMEAR: HPV, high-risk: NEGATIVE

## 2023-12-21 ENCOUNTER — Encounter: Payer: Self-pay | Admitting: Physician Assistant

## 2023-12-21 ENCOUNTER — Ambulatory Visit (INDEPENDENT_AMBULATORY_CARE_PROVIDER_SITE_OTHER): Admitting: Physician Assistant

## 2023-12-21 VITALS — BP 136/76 | HR 84 | Temp 97.5°F | Ht 64.0 in | Wt 272.0 lb

## 2023-12-21 DIAGNOSIS — F32A Depression, unspecified: Secondary | ICD-10-CM

## 2023-12-21 DIAGNOSIS — I10 Essential (primary) hypertension: Secondary | ICD-10-CM

## 2023-12-21 DIAGNOSIS — G4733 Obstructive sleep apnea (adult) (pediatric): Secondary | ICD-10-CM

## 2023-12-21 DIAGNOSIS — Z6841 Body Mass Index (BMI) 40.0 and over, adult: Secondary | ICD-10-CM | POA: Diagnosis not present

## 2023-12-21 DIAGNOSIS — L503 Dermatographic urticaria: Secondary | ICD-10-CM

## 2023-12-21 DIAGNOSIS — F419 Anxiety disorder, unspecified: Secondary | ICD-10-CM

## 2023-12-21 DIAGNOSIS — Z113 Encounter for screening for infections with a predominantly sexual mode of transmission: Secondary | ICD-10-CM

## 2023-12-21 DIAGNOSIS — R600 Localized edema: Secondary | ICD-10-CM

## 2023-12-21 DIAGNOSIS — K219 Gastro-esophageal reflux disease without esophagitis: Secondary | ICD-10-CM

## 2023-12-21 DIAGNOSIS — Z114 Encounter for screening for human immunodeficiency virus [HIV]: Secondary | ICD-10-CM

## 2023-12-21 MED ORDER — FLUTICASONE PROPIONATE 50 MCG/ACT NA SUSP: 1.0000 | Freq: Every day | NASAL | 2 refills | Status: DC

## 2023-12-21 MED ORDER — TIRZEPATIDE-WEIGHT MANAGEMENT 5 MG/0.5ML ~~LOC~~ SOAJ: 5.0000 mg | SUBCUTANEOUS | 2 refills | Status: AC

## 2023-12-21 NOTE — Patient Instructions (Addendum)
  Keep working on health goals ! You're doing great!                            Contains text generated by [employer].

## 2023-12-21 NOTE — Progress Notes (Signed)
 Patient ID: Tonya Perry, female    DOB: 09-04-1975, 48 y.o.   MRN: [id number]   Assessment & Plan:  OSA (obstructive sleep apnea) -     Tirzepatide -Weight Management; Inject 5 mg into the skin once a week.  Dispense: 2 mL; Refill: 2  Morbid obesity (HCC) -     Tirzepatide -Weight Management; Inject 5 mg into the skin once a week.  Dispense: 2 mL; Refill: 2  BMI 45.0-49.9, adult (HCC) -     Tirzepatide -Weight Management; Inject 5 mg into the skin once a week.  Dispense: 2 mL; Refill: 2  Screen for STD (sexually transmitted disease) -     HIV Antibody (routine testing w rflx) -     Hepatitis C antibody -     RPR  Encounter for screening for HIV -     HIV Antibody (routine testing w rflx)  Anxiety and depression  Bilateral edema of lower extremity  Essential hypertension  Gastroesophageal reflux disease, unspecified whether esophagitis present  Dermatographism  Other orders -     Fluticasone  Propionate; Place 1 spray into both nostrils daily. Begin by using 2 sprays in each nare daily for 3 to 5 days, then decrease to 1 spray in each nare daily.  Dispense: 15.8 mL; Refill: 2      Assessment and Plan Assessment & Plan Obesity Obesity with recent weight loss of 15 pounds since June. Engaging in increased physical activity and dietary changes, resulting in increased energy levels. - Increase Zepbound  to 5 mg once a week.  Mood disorder, anxiety, depression -Follow with psychiatry   Essential hypertension, lower leg edema Essential hypertension managed with hydrochlorothiazide . Blood pressure is well-controlled.  Gastroesophageal reflux disease Gastroesophageal reflux disease managed with omeprazole .  Dermatographism Dermatographism managed with daily Zyrtec .  General Health Maintenance Interested in HIV and hepatitis C screening. Due for colon cancer screening with a family history of polyps. - Order HIV, hepatitis C, and syphilis blood tests. - Discuss  colonoscopy as a preferred method for colon cancer screening.      Return in about 8 weeks (around 02/15/2024) for recheck/follow-up.    Subjective:    Chief Complaint  Patient presents with   Follow-up    50mo with fasting labs; pt has not fasted today but would like testing for STD, HIV, etc    HPI Discussed the use of AI scribe software for clinical note transcription with the patient, who gave verbal consent to proceed.  History of Present Illness Tonya Perry is a 48 year old female who presents for a follow-up visit to discuss weight loss and medication management.  She has lost 15 pounds since June, attributing this to increased physical activity and dietary changes. She has reduced her alcohol consumption to two days a week and drinks more unsweetened tea and zero-calorie sodas. She reports having more energy and a decrease in depressive symptoms.  Her current medications include acetaminophen  as needed for headaches, albuterol  as needed, Zyrtec  daily for dermatographism, an Epipen  for shellfish and pineapple allergies, estradiol patch, gabapentin  600 mg three times a day, hydrochlorothiazide  for blood pressure, ibuprofen  800 mg as needed, Lamictal, Mirena IUD, Provera (completed course), naltrexone, omeprazole  for GERD, Zoloft , Zepbound , and trazodone for sleep.  She wants to be tested for HIV, hepatitis C, and syphilis. She has previously been tested for other STIs.  She has a transgender roommate who is on disability, providing her with social support. 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  She has a family history of colon cancer, as her sister had a polyp. She has received a Cologuard box but has not completed the test.     Past Medical History:  Diagnosis Date   Alcohol abuse    Anxiety    Anxiety disorder 04/03/2015   Arthritis    Asthma    Asthma due to seasonal  allergies    Depression    Depression    Fibromyalgia    History of chlamydia 09/06/2017   History of trichomoniasis 09/06/2017   + RNA test, neg wet prep in office POCT   Hypertension    Iron deficiency anemia    Kienbck's disease    LEFT WRIST   Left wrist pain    retained deep implant   Patella alta     Past Surgical History:  Procedure Laterality Date   APPENDECTOMY     LAPAROSCOPIC APPENDECTOMY N/A 11/04/2013   Procedure: APPENDECTOMY LAPAROSCOPIC;  Surgeon: Donnice KATHEE Lunger, MD;  Location: [facility];  Service: General;  Laterality: N/A;   REMOVAL OF IMPLANT Left 06/13/2015   Procedure: LEFT WRIST DEEP IMPLANT REMOVAL ;  Surgeon: Prentice Pagan, MD;  Location: [facility];  Service: Orthopedics;  Laterality: Left;   WRIST RECONSTRUCTION Left 04-15-2015    for Kienbock's disease     Family History  Problem Relation Age of Onset   Depression Mother    Diabetes Mother    Mental illness Mother    Anxiety disorder Mother    COPD Father    Mental illness Father    Hypertension Father    Hyperlipidemia Father    Mental illness Sister    Mental illness Sister    Depression Maternal Grandmother    Mental illness Maternal Grandmother    Diabetes Maternal Grandfather    Mental illness Maternal Grandfather    Cancer Paternal Grandfather    Mental illness Paternal Grandfather    Sleep apnea Neg Hx     Social History   Tobacco Use   Smoking status: Former    Current packs/day: 0.00    Types: Cigarettes    Start date: 04/07/1991    Quit date: 04/06/1996    Years since quitting: 27.7   Smokeless tobacco: Never  Vaping Use   Vaping status: Never Used  Substance Use Topics   Alcohol use: Not Currently    Comment: occ   Drug use: Not Currently    Types: Marijuana     Allergies  Allergen Reactions   Other Itching, Other (See Comments) and Rash    Peas, Pineapple, Mango, Melons, Cantelope, Watermelons.  Dust mites, trees, grass and pollen.  Causes throat to  swell with itching.  Tree nuts, causes throat to swell with itching.  Peas, Pineapple, Mango, Melons, Cantelope, Watermelons.  Dust mites, trees, grass and pollen.  Causes throat to swell with itching.  Tree nuts, causes throat to swell with itching.  Peas, Mango, Melons, Cantelope, Watermelons.  Dust mites, trees, grass and pollen.  Causes throat to swell with itching.   Pineapple Other (See Comments)    Causes throat to swell with itching.   Shellfish Allergy Itching and Swelling   Penicillins Rash and Other (See Comments)   Azithromycin  Diarrhea   Latex Itching   Voltaren  [Diclofenac  Sodium] Other (See Comments)    Upset stomach; made pt feel like had to go to bathroom    Review of Systems NEGATIVE  UNLESS OTHERWISE INDICATED IN HPI      Objective:     BP 136/76   Pulse 84   Temp (!) 97.5 F (36.4 C)   Ht 5' 4 (1.626 m)   Wt 272 lb (123.4 kg)   SpO2 95%   BMI 46.69 kg/m   Wt Readings from Last 3 Encounters:  12/21/23 272 lb (123.4 kg)  09/20/23 287 lb (130.2 kg)  06/02/23 273 lb (123.8 kg)    BP Readings from Last 3 Encounters:  12/21/23 136/76  10/13/23 134/80  09/20/23 125/76     Physical Exam Vitals and nursing note reviewed.  Constitutional:      General: She is not in acute distress.    Appearance: Normal appearance. She is morbidly obese. She is not ill-appearing.  HENT:     Head: Normocephalic.  Eyes:     Extraocular Movements: Extraocular movements intact.     Conjunctiva/sclera: Conjunctivae normal.     Pupils: Pupils are equal, round, and reactive to light.  Cardiovascular:     Rate and Rhythm: Normal rate and regular rhythm.     Pulses: Normal pulses.     Heart sounds: Normal heart sounds. No murmur heard. Pulmonary:     Effort: Pulmonary effort is normal. No respiratory distress.     Breath sounds: Normal breath sounds. No wheezing.  Musculoskeletal:     Cervical back: Normal range of motion.  Skin:    General: Skin is warm.   Neurological:     Mental Status: She is alert and oriented to person, place, and time.  Psychiatric:        Mood and Affect: Mood normal.        Behavior: Behavior normal.             Ahja Martello M Tanaiya Kolarik, PA-C

## 2023-12-22 ENCOUNTER — Ambulatory Visit: Payer: Self-pay | Admitting: Physician Assistant

## 2023-12-22 ENCOUNTER — Encounter: Payer: Self-pay | Admitting: Physician Assistant

## 2023-12-22 LAB — RPR: RPR Ser Ql: NONREACTIVE

## 2023-12-22 LAB — HIV ANTIBODY (ROUTINE TESTING W REFLEX)
HIV 1&2 Ab, 4th Generation: NONREACTIVE
HIV FINAL INTERPRETATION: NEGATIVE

## 2023-12-22 LAB — HEPATITIS C ANTIBODY: Hepatitis C Ab: NONREACTIVE

## 2024-01-08 ENCOUNTER — Other Ambulatory Visit: Payer: Self-pay | Admitting: Physician Assistant

## 2024-01-08 DIAGNOSIS — R600 Localized edema: Secondary | ICD-10-CM

## 2024-02-24 ENCOUNTER — Ambulatory Visit
Admission: EM | Admit: 2024-02-24 | Discharge: 2024-02-24 | Disposition: A | Discharge status: Home / Self Care | Attending: Nurse Practitioner | Admitting: Nurse Practitioner

## 2024-02-24 ENCOUNTER — Other Ambulatory Visit: Payer: Self-pay

## 2024-02-24 DIAGNOSIS — J4520 Mild intermittent asthma, uncomplicated: Secondary | ICD-10-CM

## 2024-02-24 DIAGNOSIS — B349 Viral infection, unspecified: Secondary | ICD-10-CM | POA: Diagnosis not present

## 2024-02-24 MED ORDER — PREDNISONE 20 MG PO TABS
40.0000 mg | ORAL_TABLET | Freq: Every day | ORAL | 0 refills | Status: AC
Start: 2024-02-24 — End: 2024-02-29

## 2024-02-24 NOTE — ED Provider Notes (Signed)
 [facility]    CSN: [id number] Arrival date & time: 02/24/24  1216      History   Chief Complaint Chief Complaint  Patient presents with   Headache   Sore Throat   Fatigue    HPI Tonya Perry is a 48 y.o. female  presents for evaluation of URI symptoms for 7 days. Patient reports associated symptoms of postnasal drip, cough congestion, intermittent headache, sore throat, fatigue. Denies N/V/D, fevers, ear pain, body aches, shortness of breath. Patient does have a hx of asthma.  Has an inhaler but has not needed to use since symptom onset.  Patient is not an active smoker.   Reports sick contacts via roommate.  Pt has taken nothing OTC for symptoms. Pt has no other concerns at this time.    Headache Associated symptoms: congestion, cough, drainage, fatigue and sore throat   Sore Throat Associated symptoms include headaches.    Past Medical History:  Diagnosis Date   Alcohol abuse    Anxiety    Anxiety disorder 04/03/2015   Arthritis    Asthma    Asthma due to seasonal allergies    Depression    Depression    Fibromyalgia    History of chlamydia 09/06/2017   History of trichomoniasis 09/06/2017   + RNA test, neg wet prep in office POCT   Hypertension    Iron deficiency anemia    Kienbck's disease    LEFT WRIST   Left wrist pain    retained deep implant   Patella alta     Patient Active Problem List   Diagnosis Date Noted   Tinea pedis of both feet 11/19/2022   Alcohol abuse 07/17/2022   Gastroesophageal reflux disease 07/17/2022   Morbid obesity (HCC) 07/17/2022   Acute pain of right wrist 01/01/2022   Encounter for long-term (current) use of medications 04/11/2020   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   Chronic low back pain 08/18/2018   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   ADHD (attention deficit hyperactivity disorder) 11/16/2017   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/2017   Alcohol  dependence (HCC) 07/22/2017   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   Degeneration, intervertebral disc, lumbosacral 12/28/2014   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   Fibromyalgia 10/27/2014   Asthma, mild intermittent, uncomplicated 08/22/2014   Chronic allergic conjunctivitis 08/22/2014   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   Rash and nonspecific skin eruption 08/22/2014   Anxiety and depression 07/18/2014   Counseling on substance use and abuse 07/18/2014   Arthritis 07/18/2014   Allergic rhinitis 01/22/2014   Anxiety 09/14/2012   Obesity (BMI 30-39.9) 09/14/2012   Depression 09/14/2012    Past Surgical History:  Procedure Laterality Date   APPENDECTOMY     LAPAROSCOPIC APPENDECTOMY N/A 11/04/2013   Procedure: APPENDECTOMY LAPAROSCOPIC;  Surgeon: Donnice KATHEE Lunger, MD;  Location: [facility];  Service: General;  Laterality: N/A;   REMOVAL OF IMPLANT Left 06/13/2015   Procedure: LEFT WRIST DEEP IMPLANT REMOVAL ;  Surgeon: Prentice Pagan, MD;  Location: [facility] [place];  Service: Orthopedics;  Laterality: Left;   WRIST RECONSTRUCTION Left 04-15-2015    for Kienbock's disease     OB History   No obstetric history on file.      Home Medications    Prior to Admission medications   Medication Sig Start Date End Date Taking? Authorizing Provider  predniSONE  (DELTASONE ) 20 MG tablet Take 2 tablets (40 mg total) by mouth daily with breakfast for  5 days. 02/24/24 02/29/24 Yes Pink Maye, Jodi R, NP  acetaminophen  (TYLENOL ) 500 MG tablet Take 500 mg by mouth.    [provider]  albuterol  (VENTOLIN  HFA) 108 (90 Base) MCG/ACT inhaler Inhale 1-2 puffs into the lungs every 6 (six) hours as needed for wheezing or shortness of breath. 10/13/23   Christopher Savannah, PA-C  cetirizine  (ZYRTEC  ALLERGY) 10 MG tablet Take 1 tablet (10 mg total) by mouth daily. 01/26/23   Christopher Savannah, PA-C  EPINEPHrine  0.3 mg/0.3 mL IJ SOAJ injection Inject 0.3 mg into the muscle as needed for anaphylaxis. 11/19/22   Allwardt, Alyssa M,  PA-C  estradiol (VIVELLE-DOT) 0.05 MG/24HR patch Place 1 patch onto the skin 2 (two) times a week. 10/22/23   [provider]  fluticasone  (FLONASE ) 50 MCG/ACT nasal spray Place 1 spray into both nostrils daily. Begin by using 2 sprays in each nare daily for 3 to 5 days, then decrease to 1 spray in each nare daily. 12/21/23   Allwardt, Alyssa M, PA-C  gabapentin  (NEURONTIN ) 600 MG tablet Take 1 tab po qam and 1 tab po qafternoon and 2 tabs po qhs 07/01/17   Loreli Elyn SAILOR, MD  hydrochlorothiazide  (HYDRODIURIL ) 25 MG tablet TAKE 1 TABLET (25 MG TOTAL) BY MOUTH DAILY. 01/10/24   Allwardt, Alyssa M, PA-C  ibuprofen  (ADVIL ,MOTRIN ) 800 MG tablet Take 1 tablet (800 mg total) by mouth every 8 (eight) hours as needed. 04/27/18   Sagardia, Miguel [name], MD  lamoTRIgine (LAMICTAL) 25 MG tablet Take 25 mg by mouth 4 (four) times daily. 11/09/16   Vinita Amble, PA-C  levonorgestrel (MIRENA, 52 MG,) 20 MCG/DAY IUD 1 each by Intrauterine route once. 05/02/18   [provider]  medroxyPROGESTERone (PROVERA) 5 MG tablet Take 5 mg by mouth daily.    [provider]  naltrexone (DEPADE) 50 MG tablet Take 50 mg by mouth daily. 01/09/20   [provider]  omeprazole  (PRILOSEC) 40 MG capsule TAKE 1 CAPSULE BY MOUTH 30 MINUTES BEFORE A MEAL 10/11/23   Allwardt, Alyssa M, PA-C  sertraline  (ZOLOFT ) 100 MG tablet sertraline  100 mg tablet    [provider]  traZODone (DESYREL) 50 MG tablet Take 100 mg by mouth at bedtime.    [provider]    Family History Family History  Problem Relation Age of Onset   Depression Mother    Diabetes Mother    Mental illness Mother    Anxiety disorder Mother    COPD Father    Mental illness Father    Hypertension Father    Hyperlipidemia Father    Mental illness Sister    Mental illness Sister    Depression Maternal Grandmother    Mental illness Maternal Grandmother    Diabetes Maternal Grandfather    Mental illness Maternal Grandfather     Cancer Paternal Grandfather    Mental illness Paternal Grandfather    Sleep apnea Neg Hx     Social History Social History   Tobacco Use   Smoking status: Former    Current packs/day: 0.00    Types: Cigarettes    Start date: 04/07/1991    Quit date: 04/06/1996    Years since quitting: 27.9   Smokeless tobacco: Never  Vaping Use   Vaping status: Never Used  Substance Use Topics   Alcohol use: Yes    Comment: occ   Drug use: Not Currently    Types: Marijuana     Allergies   Other, Pineapple, Shellfish allergy, Penicillins, Azithromycin , Latex,  and Voltaren  [diclofenac  sodium]   Review of Systems Review of Systems  Constitutional:  Positive for fatigue.  HENT:  Positive for congestion, postnasal drip and sore throat.   Respiratory:  Positive for cough.   Neurological:  Positive for headaches.     Physical Exam Triage Vital Signs ED Triage Vitals  Encounter Vitals Group     BP 02/24/24 1237 124/88     Girls Systolic BP Percentile --      Girls Diastolic BP Percentile --      Boys Systolic BP Percentile --      Boys Diastolic BP Percentile --      Pulse Rate 02/24/24 1237 79     Resp 02/24/24 1237 18     Temp 02/24/24 1237 98 F (36.7 C)     Temp Source 02/24/24 1237 Oral     SpO2 02/24/24 1237 95 %     Weight --      Height --      Head Circumference --      Peak Flow --      Pain Score 02/24/24 1236 2     Pain Loc --      Pain Education --      Exclude from Growth Chart --    No data found.  Updated Vital Signs BP 124/88   Pulse 79   Temp 98 F (36.7 C) (Oral)   Resp 18   SpO2 95%   Visual Acuity Right Eye Distance:   Left Eye Distance:   Bilateral Distance:    Right Eye Near:   Left Eye Near:    Bilateral Near:     Physical Exam Vitals and nursing note reviewed.  Constitutional:      General: She is not in acute distress.    Appearance: She is well-developed. She is not ill-appearing.  HENT:     Head: Normocephalic and atraumatic.      Right Ear: Tympanic membrane and ear canal normal.     Left Ear: Tympanic membrane and ear canal normal.     Nose: Congestion present.     Mouth/Throat:     Mouth: Mucous membranes are moist.     Pharynx: Oropharynx is clear. Uvula midline. Postnasal drip present. No oropharyngeal exudate or posterior oropharyngeal erythema.     Tonsils: No tonsillar exudate or tonsillar abscesses.  Eyes:     Conjunctiva/sclera: Conjunctivae normal.     Pupils: Pupils are equal, round, and reactive to light.  Cardiovascular:     Rate and Rhythm: Normal rate and regular rhythm.     Heart sounds: Normal heart sounds.  Pulmonary:     Effort: Pulmonary effort is normal.     Breath sounds: Normal breath sounds. No wheezing or rhonchi.  Musculoskeletal:     Cervical back: Normal range of motion and neck supple.  Lymphadenopathy:     Cervical: No cervical adenopathy.  Skin:    General: Skin is warm and dry.  Neurological:     General: No focal deficit present.     Mental Status: She is alert and oriented to person, place, and time.  Psychiatric:        Mood and Affect: Mood normal.        Behavior: Behavior normal.      UC Treatments / Results  Labs (all labs ordered are listed, but only abnormal results are displayed) Labs Reviewed - No data to display  EKG   Radiology No results found.  Procedures Procedures (  including critical care time)  Medications Ordered in UC Medications - No data to display  Initial Impression / Assessment and Plan / UC Course  I have reviewed the triage vital signs and the nursing notes.  Pertinent labs & imaging results that were available during my care of the patient were reviewed by me and considered in my medical decision making (see chart for details).     Reviewed exam and symptoms with patient.  No red flags.  Discussed viral illness and symptomatic treatment.  Will do prednisone  for patient to have on hand if she develops wheezing or shortness  of breath due to her asthma.  She also has her inhaler to use as needed.  Discussed rest fluids and PCP follow-up if symptoms do not improve.  ER precautions reviewed. Final Clinical Impressions(s) / UC Diagnoses   Final diagnoses:  Viral illness  Mild intermittent asthma, unspecified whether complicated     Discharge Instructions      I have sent you prescription for prednisone  that you may take if your asthma symptoms flare such as wheezing or shortness of breath.  He also have your butyryl inhaler that you can use as needed for the symptoms.  Please treat your symptoms with over the counter cough medication, tylenol  or ibuprofen , humidifier, and rest. Viral illnesses can last 7-14 days. Please follow up with your PCP if your symptoms are not improving. Please go to the ER for any worsening symptoms. This includes but is not limited to fever you can not control with tylenol  or ibuprofen , you are not able to stay hydrated, you have shortness of breath or chest pain.  Thank you for choosing [facility] for your healthcare needs. I hope you feel better soon!      ED Prescriptions     Medication Sig Dispense Auth. Provider   predniSONE  (DELTASONE ) 20 MG tablet Take 2 tablets (40 mg total) by mouth daily with breakfast for 5 days. 10 tablet Govind Furey, Jodi R, NP      PDMP not reviewed this encounter.   Loreda Myla SAUNDERS, NP 02/24/24 [phone]

## 2024-02-24 NOTE — Discharge Instructions (Addendum)
 I have sent you prescription for prednisone  that you may take if your asthma symptoms flare such as wheezing or shortness of breath.  He also have your butyryl inhaler that you can use as needed for the symptoms.  Please treat your symptoms with over the counter cough medication, tylenol  or ibuprofen , humidifier, and rest. Viral illnesses can last 7-14 days. Please follow up with your PCP if your symptoms are not improving. Please go to the ER for any worsening symptoms. This includes but is not limited to fever you can not control with tylenol  or ibuprofen , you are not able to stay hydrated, you have shortness of breath or chest pain.  Thank you for choosing [facility] for your healthcare needs. I hope you feel better soon!

## 2024-02-24 NOTE — ED Triage Notes (Signed)
 Pt c/o nasal drainage, HA, fatigue, sore [id number]

## 2024-03-08 ENCOUNTER — Other Ambulatory Visit: Payer: Self-pay

## 2024-03-17 ENCOUNTER — Ambulatory Visit
Admission: EM | Admit: 2024-03-17 | Discharge: 2024-03-17 | Disposition: A | Discharge status: Home / Self Care | Attending: Family Medicine | Admitting: Family Medicine

## 2024-03-17 ENCOUNTER — Other Ambulatory Visit: Payer: Self-pay

## 2024-03-17 DIAGNOSIS — Z205 Contact with and (suspected) exposure to viral hepatitis: Secondary | ICD-10-CM

## 2024-03-17 NOTE — ED Provider Notes (Signed)
 [facility]    CSN: [id number] Arrival date & time: 03/17/24  1711      History   Chief Complaint No chief complaint on file.   HPI Tonya Perry is a 48 y.o. female presents for hepatitis B exposure.  Patient reports she is in a polyamorous relationship and her boyfriend had sexual intercourse with his child's mother who is positive for hepatitis B.  Patient then had intercourse with boyfriend 1 week ago.  She states boyfriend has tested negative for hepatitis B in the past.  Patient does not believe she has been vaccinated for hep B.  No abdominal pain.  No other concerns per  HPI  Past Medical History:  Diagnosis Date   Alcohol abuse    Anxiety    Anxiety disorder 04/03/2015   Arthritis    Asthma    Asthma due to seasonal allergies    Depression    Depression    Fibromyalgia    History of chlamydia 09/06/2017   History of trichomoniasis 09/06/2017   + RNA test, neg wet prep in office POCT   Hypertension    Iron deficiency anemia    Kienbck's disease    LEFT WRIST   Left wrist pain    retained deep implant   Patella alta     Patient Active Problem List   Diagnosis Date Noted   Tinea pedis 11/19/2022   Alcohol abuse 07/17/2022   Gastroesophageal reflux disease 07/17/2022   Morbid obesity (HCC) 07/17/2022   Right wrist pain 01/01/2022   Encounter for long-term (current) use of medications 04/11/2020   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   Chronic low back pain 08/18/2018   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   ADHD (attention deficit hyperactivity disorder) 11/16/2017   Kienbock's disease of adults 11/09/2017   Alcohol dependence (HCC) 07/22/2017   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   Degeneration, intervertebral disc, lumbosacral 12/28/2014   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   Fibromyalgia 10/27/2014   Asthma, mild intermittent, uncomplicated 08/22/2014   Chronic allergic conjunctivitis 08/22/2014   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  08/22/2014   Rash and nonspecific skin eruption 08/22/2014   Anxiety and depression 07/18/2014   Counseling on substance use and abuse 07/18/2014   Arthritis 07/18/2014   Allergic rhinitis 01/22/2014   Anxiety 09/14/2012   Obesity (BMI 30-39.9) 09/14/2012   Depression 09/14/2012    Past Surgical History:  Procedure Laterality Date   APPENDECTOMY     LAPAROSCOPIC APPENDECTOMY N/A 11/04/2013   Procedure: APPENDECTOMY LAPAROSCOPIC;  Surgeon: Donnice KATHEE Lunger, MD;  Location: [facility];  Service: General;  Laterality: N/A;   REMOVAL OF IMPLANT Left 06/13/2015   Procedure: LEFT WRIST DEEP IMPLANT REMOVAL ;  Surgeon: Prentice Pagan, MD;  Location: [facility];  Service: Orthopedics;  Laterality: Left;   WRIST RECONSTRUCTION Left 04-15-2015    for Kienbock's disease     OB History   No obstetric history on file.      Home Medications    Prior to Admission medications  Medication Sig Start Date End Date Taking? Authorizing Provider  doxycycline  (VIBRA -TABS) 100 MG tablet Take 100 mg by mouth 2 (two) times daily. 03/14/24  Yes [provider]  medroxyPROGESTERone (PROVERA) 10 MG tablet Take 10 mg by mouth daily.   Yes [provider]  acetaminophen  (TYLENOL ) 500 MG tablet Take 500 mg by mouth.    [provider]  albuterol  (VENTOLIN  HFA) 108 (90 Base) MCG/ACT inhaler Inhale 1-2 puffs into the lungs every 6 (  six) hours as needed for wheezing or shortness of breath. 10/13/23   Christopher Savannah, PA-C  cetirizine  (ZYRTEC  ALLERGY) 10 MG tablet Take 1 tablet (10 mg total) by mouth daily. 01/26/23   Christopher Savannah, PA-C  emtricitabine-tenofovir (TRUVADA) 200-300 MG tablet Take 1 tablet by mouth daily. 03/07/24   [provider]  EPINEPHrine  0.3 mg/0.3 mL IJ SOAJ injection Inject 0.3 mg into the muscle as needed for anaphylaxis. 11/19/22   Allwardt, Alyssa M, PA-C  estradiol (VIVELLE-DOT) 0.05 MG/24HR patch Place 1 patch onto the skin 2 (two) times a week. 10/22/23    [provider]  fluticasone  (FLONASE ) 50 MCG/ACT nasal spray Place 1 spray into both nostrils daily. Begin by using 2 sprays in each nare daily for 3 to 5 days, then decrease to 1 spray in each nare daily. 12/21/23   Allwardt, Alyssa M, PA-C  gabapentin  (NEURONTIN ) 400 MG capsule Take 400 mg by mouth. 12/24/23   [provider]  hydrochlorothiazide  (HYDRODIURIL ) 25 MG tablet TAKE 1 TABLET (25 MG TOTAL) BY MOUTH DAILY. 01/10/24   Allwardt, Alyssa M, PA-C  ibuprofen  (ADVIL ,MOTRIN ) 800 MG tablet Take 1 tablet (800 mg total) by mouth every 8 (eight) hours as needed. 04/27/18   Sagardia, Miguel [name], MD  lamoTRIgine (LAMICTAL) 25 MG tablet Take 25 mg by mouth 4 (four) times daily. 11/09/16   Vinita Amble, PA-C  levonorgestrel (MIRENA, 52 MG,) 20 MCG/DAY IUD 1 each by Intrauterine route once. 05/02/18   [provider]  medroxyPROGESTERone (PROVERA) 5 MG tablet Take 5 mg by mouth daily.    [provider]  metroNIDAZOLE  (FLAGYL ) 500 MG tablet Take 500 mg by mouth 2 (two) times daily. 01/19/24   [provider]  naltrexone (DEPADE) 50 MG tablet Take 50 mg by mouth daily. 01/09/20   [provider]  omeprazole  (PRILOSEC) 40 MG capsule TAKE 1 CAPSULE BY MOUTH 30 MINUTES BEFORE A MEAL 10/11/23   Allwardt, Alyssa M, PA-C  sertraline  (ZOLOFT ) 100 MG tablet sertraline  100 mg tablet    [provider]  traZODone (DESYREL) 100 MG tablet Take 150 mg by mouth at bedtime. 12/30/23   [provider]  ZEPBOUND  5 MG/0.5ML Pen Inject 5 mg into the skin once a week.    [provider]    Family History Family History  Problem Relation Age of Onset   Depression Mother    Diabetes Mother    Mental illness Mother    Anxiety disorder Mother    COPD Father    Mental illness Father    Hypertension Father    Hyperlipidemia Father    Mental illness Sister    Mental illness Sister    Depression Maternal Grandmother    Mental illness Maternal  Grandmother    Diabetes Maternal Grandfather    Mental illness Maternal Grandfather    Cancer Paternal Grandfather    Mental illness Paternal Grandfather    Sleep apnea Neg Hx     Social History Social History[1]   Allergies   Other, Pineapple, Shellfish allergy, Penicillins, Azithromycin , Latex, Pea, and Voltaren  [diclofenac  sodium]   Review of Systems Review of Systems  Constitutional:        Exposure to hepatitis B     Physical Exam Triage Vital Signs ED Triage Vitals  Encounter Vitals Group     BP 03/17/24 1727 134/88     Girls Systolic BP Percentile --      Girls Diastolic BP Percentile --      Boys  Systolic BP Percentile --      Boys Diastolic BP Percentile --      Pulse Rate 03/17/24 1727 81     Resp 03/17/24 1727 16     Temp 03/17/24 1727 98.6 F (37 C)     Temp Source 03/17/24 1727 Oral     SpO2 03/17/24 1727 98 %     Weight --      Height --      Head Circumference --      Peak Flow --      Pain Score 03/17/24 1724 0     Pain Loc --      Pain Education --      Exclude from Growth Chart --    No data found.  Updated Vital Signs BP 134/88   Pulse 81   Temp 98.6 F (37 C) (Oral)   Resp 16   SpO2 98%   Visual Acuity Right Eye Distance:   Left Eye Distance:   Bilateral Distance:    Right Eye Near:   Left Eye Near:    Bilateral Near:     Physical Exam Vitals and nursing note reviewed.  Constitutional:      Appearance: Normal appearance.  HENT:     Head: Normocephalic and atraumatic.  Eyes:     Pupils: Pupils are equal, round, and reactive to light.  Cardiovascular:     Rate and Rhythm: Normal rate.  Pulmonary:     Effort: Pulmonary effort is normal.  Skin:    General: Skin is warm and dry.  Neurological:     General: No focal deficit present.     Mental Status: She is alert and oriented to person, place, and time.  Psychiatric:        Mood and Affect: Mood normal.        Behavior: Behavior normal.      UC Treatments /  Results  Labs (all labs ordered are listed, but only abnormal results are displayed) Labs Reviewed  HEPATITIS B SURFACE ANTIGEN  HEPATITIS B SURFACE ANTIBODY, QUANTITATIVE    EKG   Radiology No results found.  Procedures Procedures (including critical care time)  Medications Ordered in UC Medications - No data to display  Initial Impression / Assessment and Plan / UC Course  I have reviewed the triage vital signs and the nursing notes.  Pertinent labs & imaging results that were available during my care of the patient were reviewed by me and considered in my medical decision making (see chart for details).     Labs drawn and will contact if positive.  Given recent exposure advised if these are negative she should still be rechecked in 2 to 3 months and she can do this with her PCP.  ER precautions reviewed. Final Clinical Impressions(s) / UC Diagnoses   Final diagnoses:  Exposure to hepatitis B     Discharge Instructions      The clinical contact you with results of the testing done today if positive.  Even if these results are negative I would advise you get rechecked in 2 to 3 months and you can do this with your PCP.  Go to the ER if you develop any abdominal pain.    ED Prescriptions   None    PDMP not reviewed this encounter.    [1]  Social History Tobacco Use   Smoking status: Former    Current packs/day: 0.00    Types: Cigarettes    Start date: 04/07/1991  Quit date: 04/06/1996    Years since quitting: 27.9   Smokeless tobacco: Never  Vaping Use   Vaping status: Never Used  Substance Use Topics   Alcohol use: Yes    Comment: occ   Drug use: Not Currently    Types: Marijuana     Loreda Myla SAUNDERS, NP 03/17/24 1750

## 2024-03-17 NOTE — ED Triage Notes (Addendum)
 Pt states her boyfriend had sex with his child's mom who has tested positive for hepatitis B and pt states she had sex with boyfriend a little over a week ago

## 2024-03-17 NOTE — Discharge Instructions (Addendum)
 The clinical contact you with results of the testing done today if positive.  Even if these results are negative I would advise you get rechecked in 2 to 3 months and you can do this with your PCP.  Go to the ER if you develop any abdominal pain.

## 2024-03-18 LAB — HEPATITIS B SURFACE ANTIGEN: Hepatitis B Surface Ag: NEGATIVE

## 2024-03-18 LAB — HEPATITIS B SURFACE ANTIBODY, QUANTITATIVE: Hepatitis B Surf Ab Quant: <3.5 m[IU]/mL — ABNORMAL LOW

## 2024-03-19 ENCOUNTER — Other Ambulatory Visit: Payer: Self-pay | Admitting: Physician Assistant

## 2024-03-20 ENCOUNTER — Ambulatory Visit (HOSPITAL_COMMUNITY): Payer: Self-pay

## 2024-03-22 ENCOUNTER — Other Ambulatory Visit: Payer: Self-pay | Admitting: Physician Assistant

## 2024-03-22 DIAGNOSIS — G4733 Obstructive sleep apnea (adult) (pediatric): Secondary | ICD-10-CM

## 2024-03-23 NOTE — Progress Notes (Deleted)
 Tonya Perry

## 2024-03-27 ENCOUNTER — Ambulatory Visit: Payer: Medicare Other | Admitting: Adult Health

## 2024-03-27 ENCOUNTER — Telehealth: Payer: Self-pay | Admitting: Adult Health

## 2024-03-27 NOTE — Telephone Encounter (Signed)
 Mychart message appt cancelled Provider out

## 2024-06-06 ENCOUNTER — Ambulatory Visit: Payer: Medicare Other

## 2024-06-07 ENCOUNTER — Ambulatory Visit
